# Patient Record
Sex: Female | Born: 1970
Health system: Southern US, Community
[De-identification: ages and names within clinical notes are randomized; demographics above are authoritative.]

## PROBLEM LIST (undated history)

## (undated) DIAGNOSIS — K635 Polyp of colon: Secondary | ICD-10-CM

## (undated) DIAGNOSIS — U071 COVID-19: Secondary | ICD-10-CM

## (undated) DIAGNOSIS — Z87442 Personal history of urinary calculi: Secondary | ICD-10-CM

## (undated) DIAGNOSIS — G43909 Migraine, unspecified, not intractable, without status migrainosus: Secondary | ICD-10-CM

## (undated) DIAGNOSIS — Z8619 Personal history of other infectious and parasitic diseases: Secondary | ICD-10-CM

## (undated) DIAGNOSIS — F419 Anxiety disorder, unspecified: Secondary | ICD-10-CM

## (undated) HISTORY — DX: Personal history of other infectious and parasitic diseases: Z86.19

## (undated) HISTORY — DX: Polyp of colon: K63.5

## (undated) HISTORY — DX: Anxiety disorder, unspecified: F41.9

## (undated) HISTORY — DX: Migraine, unspecified, not intractable, without status migrainosus: G43.909

---

## 1998-10-11 ENCOUNTER — Encounter: Admission: RE | Admit: 1998-10-11 | Discharge: 1999-01-09 | Payer: Self-pay | Admitting: Gynecology

## 1998-12-10 ENCOUNTER — Inpatient Hospital Stay (HOSPITAL_COMMUNITY): Admission: AD | Admit: 1998-12-10 | Discharge: 1998-12-10 | Payer: Self-pay | Admitting: Obstetrics and Gynecology

## 1999-01-21 ENCOUNTER — Inpatient Hospital Stay (HOSPITAL_COMMUNITY): Admission: AD | Admit: 1999-01-21 | Discharge: 1999-01-24 | Payer: Self-pay | Admitting: Obstetrics and Gynecology

## 1999-02-28 ENCOUNTER — Other Ambulatory Visit: Admission: RE | Admit: 1999-02-28 | Discharge: 1999-02-28 | Payer: Self-pay | Admitting: Obstetrics and Gynecology

## 2000-04-15 ENCOUNTER — Other Ambulatory Visit: Admission: RE | Admit: 2000-04-15 | Discharge: 2000-04-15 | Payer: Self-pay | Admitting: Gynecology

## 2001-07-28 ENCOUNTER — Other Ambulatory Visit: Admission: RE | Admit: 2001-07-28 | Discharge: 2001-07-28 | Payer: Self-pay | Admitting: Gynecology

## 2002-09-01 ENCOUNTER — Other Ambulatory Visit: Admission: RE | Admit: 2002-09-01 | Discharge: 2002-09-01 | Payer: Self-pay | Admitting: Gynecology

## 2003-09-06 ENCOUNTER — Other Ambulatory Visit: Admission: RE | Admit: 2003-09-06 | Discharge: 2003-09-06 | Payer: Self-pay | Admitting: Gynecology

## 2003-11-16 ENCOUNTER — Ambulatory Visit (HOSPITAL_COMMUNITY): Admission: RE | Admit: 2003-11-16 | Discharge: 2003-11-16 | Payer: Self-pay | Admitting: Gastroenterology

## 2004-02-28 ENCOUNTER — Encounter: Admission: RE | Admit: 2004-02-28 | Discharge: 2004-02-28 | Payer: Self-pay | Admitting: Gynecology

## 2004-11-22 ENCOUNTER — Other Ambulatory Visit: Admission: RE | Admit: 2004-11-22 | Discharge: 2004-11-22 | Payer: Self-pay | Admitting: Gynecology

## 2005-11-25 ENCOUNTER — Other Ambulatory Visit: Admission: RE | Admit: 2005-11-25 | Discharge: 2005-11-25 | Payer: Self-pay | Admitting: Gynecology

## 2006-05-15 ENCOUNTER — Other Ambulatory Visit: Admission: RE | Admit: 2006-05-15 | Discharge: 2006-05-15 | Payer: Self-pay | Admitting: Gynecology

## 2007-03-11 DIAGNOSIS — K635 Polyp of colon: Secondary | ICD-10-CM

## 2007-03-11 HISTORY — DX: Polyp of colon: K63.5

## 2007-03-26 ENCOUNTER — Other Ambulatory Visit: Admission: RE | Admit: 2007-03-26 | Discharge: 2007-03-26 | Payer: Self-pay | Admitting: Gynecology

## 2008-02-07 LAB — CONVERTED CEMR LAB: Pap Smear: NORMAL

## 2008-04-04 ENCOUNTER — Other Ambulatory Visit: Admission: RE | Admit: 2008-04-04 | Discharge: 2008-04-04 | Payer: Self-pay | Admitting: Gynecology

## 2008-06-26 ENCOUNTER — Emergency Department (HOSPITAL_BASED_OUTPATIENT_CLINIC_OR_DEPARTMENT_OTHER): Admission: EM | Admit: 2008-06-26 | Discharge: 2008-06-26 | Payer: Self-pay | Admitting: Emergency Medicine

## 2008-08-16 ENCOUNTER — Ambulatory Visit: Payer: Self-pay | Admitting: Gynecology

## 2009-01-10 ENCOUNTER — Ambulatory Visit: Payer: Self-pay | Admitting: Internal Medicine

## 2009-01-10 DIAGNOSIS — Z860100 Personal history of colon polyps, unspecified: Secondary | ICD-10-CM | POA: Insufficient documentation

## 2009-01-10 DIAGNOSIS — G4700 Insomnia, unspecified: Secondary | ICD-10-CM | POA: Insufficient documentation

## 2009-01-10 DIAGNOSIS — D126 Benign neoplasm of colon, unspecified: Secondary | ICD-10-CM | POA: Insufficient documentation

## 2009-01-10 DIAGNOSIS — M25559 Pain in unspecified hip: Secondary | ICD-10-CM | POA: Insufficient documentation

## 2009-01-10 DIAGNOSIS — K649 Unspecified hemorrhoids: Secondary | ICD-10-CM | POA: Insufficient documentation

## 2009-01-16 ENCOUNTER — Encounter: Payer: Self-pay | Admitting: Internal Medicine

## 2009-01-16 LAB — CONVERTED CEMR LAB
ALT: 17 units/L (ref 0–35)
AST: 16 units/L (ref 0–37)
Albumin: 4 g/dL (ref 3.5–5.2)
Alkaline Phosphatase: 74 units/L (ref 39–117)
BUN: 17 mg/dL (ref 6–23)
Basophils Absolute: 0.1 10*3/uL (ref 0.0–0.1)
Basophils Relative: 1 % (ref 0.0–3.0)
Bilirubin, Direct: 0.1 mg/dL (ref 0.0–0.3)
CO2: 26 meq/L (ref 19–32)
Calcium: 9.4 mg/dL (ref 8.4–10.5)
Chloride: 109 meq/L (ref 96–112)
Cholesterol: 159 mg/dL (ref 0–200)
Creatinine, Ser: 0.7 mg/dL (ref 0.4–1.2)
Eosinophils Absolute: 0.1 10*3/uL (ref 0.0–0.7)
Eosinophils Relative: 1.5 % (ref 0.0–5.0)
Free T4: 0.6 ng/dL (ref 0.6–1.6)
GFR calc Af Amer: 120 mL/min
GFR calc non Af Amer: 99 mL/min
Glucose, Bld: 87 mg/dL (ref 70–99)
HCT: 39.9 % (ref 36.0–46.0)
HDL: 39.5 mg/dL (ref >39.0)
Hemoglobin: 13.5 g/dL (ref 12.0–15.0)
LDL Cholesterol: 107 mg/dL — ABNORMAL HIGH (ref 0–99)
Lymphocytes Relative: 25.7 % (ref 12.0–46.0)
MCHC: 33.8 g/dL (ref 30.0–36.0)
MCV: 89.1 fL (ref 78.0–100.0)
Monocytes Absolute: 0.6 10*3/uL (ref 0.1–1.0)
Monocytes Relative: 8 % (ref 3.0–12.0)
Neutro Abs: 5.1 10*3/uL (ref 1.4–7.7)
Neutrophils Relative %: 63.8 % (ref 43.0–77.0)
Platelets: 293 10*3/uL (ref 150–400)
Potassium: 4 meq/L (ref 3.5–5.1)
RBC: 4.48 M/uL (ref 3.87–5.11)
RDW: 12.5 % (ref 11.5–14.6)
Sodium: 144 meq/L (ref 135–145)
TSH: 0.43 microintl units/mL (ref 0.35–5.50)
Total Bilirubin: 0.5 mg/dL (ref 0.3–1.2)
Total CHOL/HDL Ratio: 4
Total Protein: 6.8 g/dL (ref 6.0–8.3)
Triglycerides: 65 mg/dL (ref 0–149)
VLDL: 13 mg/dL (ref 0–40)
WBC: 7.9 10*3/uL (ref 4.5–10.5)

## 2009-01-17 ENCOUNTER — Telehealth: Payer: Self-pay | Admitting: Internal Medicine

## 2009-01-19 ENCOUNTER — Ambulatory Visit: Payer: Self-pay | Admitting: Internal Medicine

## 2009-01-19 DIAGNOSIS — G43819 Other migraine, intractable, without status migrainosus: Secondary | ICD-10-CM

## 2009-01-19 DIAGNOSIS — G43909 Migraine, unspecified, not intractable, without status migrainosus: Secondary | ICD-10-CM | POA: Insufficient documentation

## 2009-01-19 DIAGNOSIS — G43119 Migraine with aura, intractable, without status migrainosus: Secondary | ICD-10-CM | POA: Insufficient documentation

## 2009-01-24 ENCOUNTER — Encounter: Payer: Self-pay | Admitting: Internal Medicine

## 2009-01-24 ENCOUNTER — Telehealth: Payer: Self-pay | Admitting: Internal Medicine

## 2009-02-09 ENCOUNTER — Ambulatory Visit: Payer: Self-pay | Admitting: Internal Medicine

## 2009-02-28 ENCOUNTER — Encounter: Admission: RE | Admit: 2009-02-28 | Discharge: 2009-02-28 | Payer: Self-pay | Admitting: Gynecology

## 2009-03-15 ENCOUNTER — Encounter: Payer: Self-pay | Admitting: Internal Medicine

## 2009-04-03 ENCOUNTER — Telehealth: Payer: Self-pay | Admitting: *Deleted

## 2009-04-06 ENCOUNTER — Encounter: Payer: Self-pay | Admitting: Gynecology

## 2009-04-06 ENCOUNTER — Ambulatory Visit: Payer: Self-pay | Admitting: Gynecology

## 2009-04-06 ENCOUNTER — Other Ambulatory Visit: Admission: RE | Admit: 2009-04-06 | Discharge: 2009-04-06 | Payer: Self-pay | Admitting: Gynecology

## 2009-06-08 ENCOUNTER — Telehealth (INDEPENDENT_AMBULATORY_CARE_PROVIDER_SITE_OTHER): Payer: Self-pay | Admitting: *Deleted

## 2009-10-23 ENCOUNTER — Encounter (INDEPENDENT_AMBULATORY_CARE_PROVIDER_SITE_OTHER): Payer: Self-pay | Admitting: *Deleted

## 2010-02-09 ENCOUNTER — Emergency Department (HOSPITAL_BASED_OUTPATIENT_CLINIC_OR_DEPARTMENT_OTHER): Admission: EM | Admit: 2010-02-09 | Discharge: 2010-02-09 | Payer: Self-pay | Admitting: Emergency Medicine

## 2010-02-09 ENCOUNTER — Ambulatory Visit: Payer: Self-pay | Admitting: Diagnostic Radiology

## 2010-03-01 ENCOUNTER — Encounter: Admission: RE | Admit: 2010-03-01 | Discharge: 2010-03-01 | Payer: Self-pay | Admitting: Gynecology

## 2010-04-09 ENCOUNTER — Ambulatory Visit: Payer: Self-pay | Admitting: Gynecology

## 2010-04-09 ENCOUNTER — Other Ambulatory Visit: Admission: RE | Admit: 2010-04-09 | Discharge: 2010-04-09 | Payer: Self-pay | Admitting: Gynecology

## 2011-01-08 NOTE — Letter (Signed)
Summary: [facility]  [facility] Provider: This provider was preselected by the workflow.  Signature: The signature status of this document was preset by the workflow  Processed by [employer] Local Indexer Client @ Friday, October 27, 2009 2:05:19 PM using version:2010.1.2.11(2.4)   Manually Indexed By: [id number]  idlBatchDetail: [id number]   _____________________________________________________________________  External Attachment:    Type:   Image     Comment:   External Document

## 2011-01-08 NOTE — Progress Notes (Signed)
Summary: Phone note  Phone Note Call from Patient   Summary of Call: Patient returning your call. Please call her back at [phone]. Initial call taken by: Darra Lis RMA,  April 03, 2009 9:16 AM  Follow-up for Phone Call        I spoke to pt and told her that we didn't call her. She said that she did have a stress test on Wed. I told her that we would call her once we get the results. Follow-up by: Romualdo Bolk, CMA,  April 03, 2009 3:16 PM

## 2011-01-08 NOTE — Assessment & Plan Note (Signed)
Summary: 1 mo rov/mm   Vital Signs:  Patient Profile:   40 Years Old Female Height:     65.5 inches Weight:      195.2 pounds Pulse rate:   78 / minute BP sitting:   110 / 70  (left arm) Cuff size:   regular  Vitals Entered By: Romualdo Bolk, CMA (February 09, 2009 9:19 AM)                 Chief Complaint:  Follow up.  History of Present Illness: Patricia Erickson is here for a follow up. inregard to HA and sinus congestion    Pt has seen Dr. Neale Burly and he told her to go off everything and cancel ENT appt. He doesn't want her on any narcotics.  On topamax and flexeril  for the last 2 weeks.   Feels the whole clinincal picture is from  Migraine HAs. INregard to insomia .  Pt is going to see a Sleep specialist at his office.   His has referred her to a nutrionist.   In the meantime has had extra stress and  Took xanax  x 1 after   Father  LAw died.      1 pill at  night .   HAs slightly improved since last visit.     Prior Medications Reviewed Using: Patient Recall  Updated Prior Medication List: MIRENA 20 MCG/24HR IUD (LEVONORGESTREL)  TOPAMAX 25 MG TABS (TOPIRAMATE) 3 by mouth at bedtime FLEXERIL 10 MG TABS (CYCLOBENZAPRINE HCL) 1/2 to 1 three times a day ALPRAZOLAM 0.5 MG TABS (ALPRAZOLAM) 1 by mouth one to two times a day as needed.  Current Allergies (reviewed today): ! DARVOCET  Past Medical History:    Past Medical History:         G2P1` toxemia            LAST       Mammogram: 01/2006       Pap: 3/09       Td: over 10 years       Colonscopy: 2009       EKG: 2009       Dexa: 2008       Eye Exam: 2009              Smoking: former        Consults       Dr. Barton Fanny       Dr. Loreta Ave- GI       Colonic polyps, hx of   aggressive on q 3 years surveillance .     Dr. Marylene Buerger    Dr. Charlann Boxer    Dr. Pollyann Kennedy    Dr. Neale Burly   Social History:    Father in law recently died      Social History:       Former Smoker       Alcohol use-no       Drug use-no  Regular exercise-yes       hh of 3  1 dog           no ets.        2-4 hours of sleep     Review of Systems  The patient denies anorexia, fever, unusual weight change, enlarged lymph nodes, and angioedema.     Physical Exam  General:     Well-developed,well-nourished,in no acute distress; alert,appropriate and cooperative throughout examination mildly uncomfortable with ha  Additional Exam:     reviewed Dr Neale Burly  notes     Impression & Recommendations:  Problem # 1:  MIGRAINE HEADACHE (ICD-346.90) agree with dx and  encourage to continue with plan . The following medications were removed from the medication list:    Norco 5-325 Mg Tabs (Hydrocodone-acetaminophen) .Marland Kitchen... 1 by mouth q 4-6 hours as needed sever pain   Problem # 2:  INSOMNIA (ICD-780.52) to be evaluated this week.  Problem # 3:  HIP PAIN, LEFT (ICD-719.45) intermettent  and will reschedule to tsee ortho . fam hx of hip problems . Ask Dr Elayne Guerin about possiblility of topical voltaren. if needed and also get ortho to rec any exercise intervention. The following medications were removed from the medication list:    Norco 5-325 Mg Tabs (Hydrocodone-acetaminophen) .Marland Kitchen... 1 by mouth q 4-6 hours as needed sever pain  Her updated medication list for this problem includes:    Flexeril 10 Mg Tabs (Cyclobenzaprine hcl) .Marland Kitchen... 1/2 to 1 three times a day   Complete Medication List: 1)  Mirena 20 Mcg/24hr Iud (Levonorgestrel) 2)  Topamax 25 Mg Tabs (Topiramate) .... 3 by mouth at bedtime 3)  Flexeril 10 Mg Tabs (Cyclobenzaprine hcl) .... 1/2 to 1 three times a day 4)  Alprazolam 0.5 Mg Tabs (Alprazolam) .Marland Kitchen.. 1 by mouth one to two times a day as needed.   Patient Instructions: 1)  will recheck as needed . TO follow up with specialist.

## 2011-01-08 NOTE — Assessment & Plan Note (Signed)
Summary: HEADACHE/[facility]   Vital Signs:  Patient Profile:   40 Years Old Female Weight:      195 pounds Temp:     97.8 degrees F oral Pulse rate:   74 / minute BP sitting:   100 / 60  (left arm) Cuff size:   regular  Vitals Entered By: Romualdo Bolk, CMA (January 19, 2009 8:17 AM)  Menstrual History: LMP - Character: 03/2007-IUD Menarche: [age]                 Chief Complaint:  Headache.  History of Present Illness: Patricia Erickson is here for a headache that started on 01/10/09. Pt states that the severe one started after d/c decongestant but she ususally has some form of ha everyday and gets worse as the day goes on.   See phone notes   Now pain is about a 5/10.    worse as day goes on.  starts mid face andthen  No vision changes  and gets nausea. Better today  . is stopping   decongestant   .   Ha mostly every daysince then  and  then increase ha now.    .   Has  taken 2 days of prednisone.   no se . trazadone no help  with sleep so far and no side effect with daytime sleepiness .     Prior Medications Reviewed Using: Patient Recall  Prior Medication List:  MIRENA 20 MCG/24HR IUD (LEVONORGESTREL)  OMNARIS 50 MCG/ACT SUSP (CICLESONIDE) 2 spray eaach nares each day TRAZODONE HCL 50 MG TABS (TRAZODONE HCL) 1 by mouth hs can increase to 75 mg hs or as directed PREDNISONE 20 MG TABS (PREDNISONE) 2 by mouth tonight and 2 in am then as directed   Current Allergies (reviewed today): ! DARVOCET  Past Medical History:    Past Medical History:         G2P1` toxemia            LAST       Mammogram: 01/2006       Pap: 3/09       Td: over 10 years       Colonscopy: 2009       EKG: 2009       Dexa: 2008       Eye Exam: 2009              Smoking: former       Consults       Dr. Barton Fanny       Dr. Loreta Ave- GI       Colonic polyps, hx of   aggresseive on q 3 years surveillance .     Dr. Marylene Buerger    Dr. Charlann Boxer    Dr. Pollyann Kennedy  Past Surgical History:        Past  Surgical History:       C-Section       G2 P1   Family History:    Family History:       Father: 2 heart Stents 66 yrs, atrial fib, arthritis, bulging back disk and double knee replacement       Mother: HBP, restless leg syndrome, total abd. colectomy (benign mass), back fusion x 2, arthritis, lymphoma in abdomen 2 009        Siblings: Sister-back pain, headaches       Maternal Aunt   46 died  colon cancer        PGF Diabetes   Social History:  Social History:       Former Smoker       Alcohol use-no       Drug use-no       Regular exercise-yes       hh of 3  1 dog          no ets.        2-4 hours of sleep     Review of Systems  The patient denies anorexia, fever, weight loss, vision loss, chest pain, syncope, transient blindness, difficulty walking, depression, enlarged lymph nodes, and angioedema.     Physical Exam  General:     well-developed, well-nourished, and well-hydrated.  milldy uncomfortable Head:     Normocephalic and atraumatic without obvious abnormalities. No apparent alopecia or balding. Eyes:     PERRL, EOMs full, conjunctiva clear  Ears:     R ear normal and L ear normal.   Nose:     no external deformity, no external erythema, and no nasal discharge.   Mouth:     pharynx pink and moist.   Neck:     No deformities, masses, or tenderness noted. Lungs:     normal respiratory effort and no intercostal retractions.   Heart:     Normal rate and regular rhythm. S1 and S2 normal without gallop, murmur, click, rub or other extra sounds. Extremities:     nlo acute changes  Neurologic:     non focal groslly alert & oriented X3, strength normal in all extremities, and gait normal.   Skin:     turgor normal and color normal.   Cervical Nodes:     No lymphadenopathy noted Psych:     Oriented X3, normally interactive, not anxious appearing, and not depressed appearing.      Impression & Recommendations:  Problem # 1:  MIGRAINE HEADACHE  (ICD-346.90) probable  chronic daily HAs  with progression r/o sinusitis problems etc.    non focal exam today  . would benefit from HA referral also .  ENT evaluation pending  Her updated medication list for this problem includes:    Norco 5-325 Mg Tabs (Hydrocodone-acetaminophen) .Marland Kitchen... 1 by mouth q 4-6 hours as needed sever pain  Orders: Ketorolac-Toradol 15mg  (K0254) Admin of Therapeutic Inj  intramuscular or subcutaneous (27062) Headache Clinic Referral (Headache)   Problem # 2:  Preventive Health Care (ICD-V70.0) reviewed lipid level    mediterranean diet and exercise . avoid trans fats.   Problem # 3:  INSOMNIA (ICD-780.52) Assessment: Comment Only increase meds . ? if decongestants contributing.  Complete Medication List: 1)  Mirena 20 Mcg/24hr Iud (Levonorgestrel) 2)  Omnaris 50 Mcg/act Susp (Ciclesonide) .... 2 spray eaach nares each day 3)  Trazodone Hcl 50 Mg Tabs (Trazodone hcl) .Marland Kitchen.. 1 by mouth hs can increase to 75 mg hs or as directed 4)  Prednisone 20 Mg Tabs (Prednisone) .... 2 by mouth tonight and 2 in am then as directed 5)  Norco 5-325 Mg Tabs (Hydrocodone-acetaminophen) .Marland Kitchen.. 1 by mouth q 4-6 hours as needed sever pain   Patient Instructions: 1)  increase  trazadone to 100 mg at night . 2)  Stay on 40 mg   of prednisone   2 20 mg    for another 5 days .  3)  Will do a HA referral also  4)  narcotic for rescue   medication   .     Prescriptions: NORCO 5-325 MG TABS (HYDROCODONE-ACETAMINOPHEN) 1 by mouth q 4-6 hours as  needed sever pain  #15 x 0   Entered and Authorized by:   Madelin Headings MD   Signed by:   Madelin Headings MD on 01/19/2009   Method used:   Print then Give to Patient   RxID:   [id number]    Medication Administration  Injection # 1:    Medication: Ketorolac-Toradol 15mg     Diagnosis: MIGRAINE HEADACHE (ICD-346.90)    Route: IM    Site: RUOQ gluteus    Exp Date: 06/08/2010    Lot #: [id number]    Mfr: novaplus    Comments: Gave  60mg     Patient tolerated injection without complications    Given by: Romualdo Bolk, CMA (January 19, 2009 9:00 AM)  Orders Added: 1)  Ketorolac-Toradol 15mg  [J1885] 2)  Admin of Therapeutic Inj  intramuscular or subcutaneous [96372] 3)  Est. Patient Level IV [62130] 4)  Headache Clinic Referral [Headache]

## 2011-01-08 NOTE — Progress Notes (Signed)
Summary: headache so bad  Phone Note Call from Patient Call back at [phone]   Caller: live Call For: panosh Reason for Call: Acute Illness Summary of Call: Off the decongestant & using the nasal spray and Motrin 800mg  daily that she takes for her hip pain no help with headache.  Head hurts so bad was in bed today until 1pm & still would be if she didn't have to drive son to soccer.  Cannot make it like this until ENT appt 2-23.  What to do?  [facility].   Allergic to Darvocet. Initial call taken by: Rudy Jew, RN,  January 17, 2009 3:20 PM  Follow-up for Phone Call        Per Dr. Danelle Berry no fever can call in prednisone 20mg  #20 2 at bedtime and 2am or as directed and ov tomorrow. If miagraine this may help but if has a fever may need to go to ED. Follow-up by: Romualdo Bolk, CMA,  January 17, 2009 3:29 PM  Additional Follow-up for Phone Call Additional follow up Details #1::        I spoke to pt, she can't get here today. She is not running a fever. Pt will come in tomorrow and wants rx called into [facility]. Rx called in. Additional Follow-up by: Romualdo Bolk, CMA,  January 17, 2009 3:45 PM    Additional Follow-up for Phone Call Additional follow up Details #2::    Pt called back saying that she can't come in tomorrow. Pt  did make an appt for Thurs and will go ahead and take 2 tabs daily until her appt. Follow-up by: Romualdo Bolk, CMA,  January 17, 2009 4:12 PM  New/Updated Medications: PREDNISONE 20 MG TABS (PREDNISONE) 2 by mouth tonight and 2 in am then as directed   Prescriptions: PREDNISONE 20 MG TABS (PREDNISONE) 2 by mouth tonight and 2 in am then as directed  #20 x 0   Entered by:   Romualdo Bolk, CMA   Authorized by:   Madelin Headings MD   Signed by:   Romualdo Bolk, CMA on 01/17/2009   Method used:   Electronically to        [facility] (retail)       [phone]       [place], [place]  [id number]       Ph: [phone]       Fax: [phone]   RxID:   [id number]

## 2011-01-08 NOTE — Consult Note (Signed)
[facility]: [facility]   Imported By: Maryln Gottron 02/09/2009 12:37:44  _____________________________________________________________________  External Attachment:    Type:   Image     Comment:   External Document

## 2011-01-08 NOTE — Progress Notes (Signed)
Summary: Medical History provided by patient  Medical History provided by patient   Imported By: Maryln Gottron 01/25/2009 13:05:33  _____________________________________________________________________  External Attachment:    Type:   Image     Comment:   External Document

## 2011-01-08 NOTE — Assessment & Plan Note (Signed)
Summary: new pt to establish/jls   Vital Signs:  Patient Profile:   40 Years Old Female Height:     65.5 inches Weight:      177 pounds Pulse rate:   78 / minute BP sitting:   120 / 80  (right arm) Cuff size:   regular  Vitals Entered By: Romualdo Bolk, CMA (January 10, 2009 9:07 AM)  Menstrual History: LMP - Character: spotting- 2 months ago Menarche: 13                 Chief Complaint:  New Patient.  History of Present Illness: Patricia Erickson is here to get establish.  Hasnt had a PCP  but GYne adn Pt is having left sided hip pain that has been going on and off for 2 years. Pt did have PT done on it and it didn't help. Pt is also having trouble sleeping. Pt has recurrent sinus infections and is also having burning feet. She only notices it when she wears Tennis Shoes. Pt is also stuggling with her weight even with diet and exercise.   Last labs one year ago.    Dr Steward Ros.       Sleep  :     took husbands neurontin  and helped .     Has been given [place] cr   [facility] .   Problem for about 18  months ago.     No felt to be related to stress  is a light sleeper .   sound and light.   Gets 4 full hours .      No osa signssnoring with congestion husband snores.  NO RLS  . Hip pain    pulled tendon and PT not helping and worse  with exercise  and sitting is painful.  after 6 weeks or so .  lateral left  area.    No treatment except motrin  OTC dosing.    Ball of foot burns when does confined shoes .    Chronic sinus pressure signs   .   takes mucinex d  most days   in am for about 3-4 years .   then pain gets worse.    remote hx of migraines .   sick ha s  .   No hx of     Doesnt like nasal sprays .   spring allergy  signs .   allegra in the past.      HA face temperature chanbes are worse .   No rhinorrhea.       Prior Medications Reviewed Using: Patient Recall  Updated Prior Medication List: MIRENA 20 MCG/24HR IUD (LEVONORGESTREL)   Current  Allergies: ! DARVOCET  Past Medical History:      G2P1` toxemia         LAST    Mammogram: 01/2006    Pap: 3/09    Td: over 10 years    Colonscopy: 2009    EKG: 2009    Dexa: 2008    Eye Exam: 2009        Smoking: former    Consults    Dr. Barton Fanny    Dr. Loreta Ave- GI    Colonic polyps, hx of   aggresseive on q 3 years surveillance .   Past Surgical History:    C-Section    G2 P1   Family History:    Father: 2 heart Stents 66 yrs, atrial fib, arthritis, bulging back disk and  double knee replacement    Mother: HBP, restless leg syndrome, total abd. colectomy (benign mass), back fusion x 2, arthritis, lymphoma in abdomen 2 009     Siblings: Sister-back pain, headaches    Maternal Aunt   46 died  colon cancer     PGF Diabetes   Social History:    Former Smoker    Alcohol use-no    Drug use-no    Regular exercise-yes    hh of 3  1 dog       no ets.     2-4 hours of sleep    Risk Factors:  Tobacco use:  quit    Year quit:  age 55 Drug use:  no Alcohol use:  no Exercise:  yes    Times per week:  3    Type:  treadmill and eliptical  PAP Smear History:     Date of Last PAP Smear:  02/07/2008    Results:  Normal   Colonoscopy History:     Date of Last Colonoscopy:  12/10/2007    Results:  Normal   Mammogram History:     Date of Last Mammogram:  01/09/2006    Results:  Normal Bilateral    Review of Systems       The patient complains of headaches.  The patient denies anorexia, fever, weight loss, chest pain, syncope, dyspnea on exertion, prolonged cough, melena, hematochezia, severe indigestion/heartburn, transient blindness, enlarged lymph nodes, and angioedema.         never has slept well   Physical Exam  General:     Well-developed,well-nourished,in no acute distress; alert,appropriate and cooperative throughout examination Head:     Normocephalic and atraumatic without obvious abnormalities. No apparent alopecia or balding. Eyes:     PERRL,  EOMs full, conjunctiva clear  Ears:     R ear normal, L ear normal, and no external deformities.   Nose:     no external deformity and no external erythema.  face ? tender  ethmoid frontal area  Mouth:     pharynx pink and moist.  low lying palate Neck:     No deformities, masses, or tenderness noted. Lungs:     Normal respiratory effort, chest expands symmetrically. Lungs are clear to auscultation, no crackles or wheezes. Heart:     Normal rate and regular rhythm. S1 and S2 normal without gallop, murmur, click, rub or other extra sounds.no lifts.   Abdomen:     Bowel sounds positive,abdomen soft and non-tender without masses, organomegaly or hernias noted. Msk:     no atrophy mild tender left lateral hip     gait normal today Pulses:     prsent intact  Extremities:     no cce  Neurologic:     non focal  Skin:     turgor normal, color normal, no suspicious lesions, and no ecchymoses.   Cervical Nodes:     No lymphadenopathy noted Psych:     Oriented X3, normally interactive, good eye contact, not anxious appearing, and not depressed appearing.      Impression & Recommendations:  Problem # 1:  INSOMNIA (ICD-780.52)  Orders: Sedimentation Rate, non-automated (09811) Venipuncture (91478) TLB-TSH (Thyroid Stimulating Hormone) (84443-TSH) TLB-Hepatic/Liver Function Pnl (80076-HEPATIC) TLB-CBC Platelet - w/Differential (85025-CBCD) TLB-BMP (Basic Metabolic Panel-BMET) (80048-METABOL) TLB-Lipid Panel (80061-LIPID) TLB-T4 (Thyrox), Free [phone])   Problem # 2:  HIP PAIN, LEFT (ICD-719.45)  Orders: Sedimentation Rate, non-automated (65784) Venipuncture (69629) TLB-Hepatic/Liver Function Pnl (80076-HEPATIC) TLB-CBC Platelet - w/Differential (85025-CBCD)  TLB-BMP (Basic Metabolic Panel-BMET) (80048-METABOL) TLB-Lipid Panel (80061-LIPID) Orthopedic Referral (Ortho)   Problem # 3:  ? of SINUSITIS, CHRONIC (ICD-473.9)  Her updated medication list for this problem  includes:    Omnaris 50 Mcg/act Susp (Ciclesonide) .Marland Kitchen... 2 spray eaach nares each day  Orders: Sedimentation Rate, non-automated (16109) Venipuncture (60454) TLB-Hepatic/Liver Function Pnl (80076-HEPATIC) TLB-CBC Platelet - w/Differential (85025-CBCD) TLB-BMP (Basic Metabolic Panel-BMET) (80048-METABOL) TLB-Lipid Panel (80061-LIPID) ENT Referral (ENT)   Problem # 4:  COLONIC POLYPS, HX OF (ICD-V12.72)  Orders: TLB-Hepatic/Liver Function Pnl (80076-HEPATIC) TLB-CBC Platelet - w/Differential (85025-CBCD) TLB-BMP (Basic Metabolic Panel-BMET) (80048-METABOL) TLB-Lipid Panel (80061-LIPID)   Problem # 5:  foot pain sound like a local phenomenon.  Complete Medication List: 1)  Mirena 20 Mcg/24hr Iud (Levonorgestrel) 2)  Omnaris 50 Mcg/act Susp (Ciclesonide) .... 2 spray eaach nares each day 3)  Trazodone Hcl 50 Mg Tabs (Trazodone hcl) .Marland Kitchen.. 1 by mouth hs can increase to 75 mg hs or as directed  PAP Screening:    Last PAP smear:  02/07/2008  Mammogram Screening:    Last Mammogram:  01/09/2006  Osteoporosis Risk Assessment:  Risk Factors for Fracture or Low Bone Density:   Race (White or Asian):     yes   Smoking status:       quit  Bone Density (DEXA Scan) Results:    Date of Exam:  12/09/2006    Results:  Normal   Patient Instructions: 1)  will do ortho sports medicine   and ENT  consult   2)  Minimize your decongestant andt try nasal steroid.for a t least 2 weeks. 3)  Ibuprofen 800 mg up to every 8 hours for your hip pain.    4)  return office visit in about 1-2 months or asneeded.    Prescriptions: TRAZODONE HCL 50 MG TABS (TRAZODONE HCL) 1 by mouth hs can increase to 75 mg hs or as directed  #45 x 1   Entered and Authorized by:   Madelin Headings MD   Signed by:   Madelin Headings MD on 01/10/2009   Method used:   Print then Give to Patient   RxID:   [id number] OMNARIS 50 MCG/ACT SUSP (CICLESONIDE) 2 spray eaach nares each day  #1 x 3   Entered and Authorized  by:   Madelin Headings MD   Signed by:   Madelin Headings MD on 01/10/2009   Method used:   Print then Give to Patient   RxID:   [id number]   Laboratory Results   Blood Tests     SED rate: 5 mm/hr  Comments: Rita Ohara  January 10, 2009 12:04 PM

## 2011-01-08 NOTE — Progress Notes (Signed)
Summary: cancel ENT  Phone Note Call from Patient   Summary of Call: Pt called to cancel ENT appt.  She was diagnosed with migraines today, and she does not need ENT.  She will cancel herself. Initial call taken by: Lynann Beaver CMA,  January 24, 2009 1:20 PM

## 2011-01-08 NOTE — Progress Notes (Signed)
Summary: Needs med for motion sickness  Phone Note Call from Patient   Summary of Call: Patient is going on a crusie next Thursday and wants something to take with her for motion sickness. Pharm/[facility]. Patient can be reached at [phone]. Initial call taken by: Darra Lis RMA,  June 08, 2009 3:06 PM  Follow-up for Phone Call        she can either use dramamine or bonine  over the counter  and or transderm scopol omine  ear patch apply 1 hours before cruise and change or remove in 72  hours . Disp # 2  Follow-up by: Madelin Headings MD,  June 09, 2009 9:08 AM  Additional Follow-up for Phone Call Additional follow up Details #1::        Patient informed and Rx called to [facility] [phone]. Additional Follow-up by: Darra Lis RMA,  June 09, 2009 9:34 AM

## 2011-01-08 NOTE — Letter (Signed)
Summary: Lipid Letter  [facility]  [address] [place], [place] [address]   Phone: [phone]  Fax: [phone]    01/16/2009  [facility] [address] [place], [place]  [address]  Dear Mindi Junker:  We have carefully reviewed your last lipid profile from 01/10/2009 and the results are noted below with a summary of recommendations for lipid management.    Cholesterol:       159     Goal: < 200   HDL "good" Cholesterol:   62.9     Goal: > 39.0   LDL "bad" Cholesterol:   107     Goal: < 99   Triglycerides:       65     Goal: < 150     All other labs are normal and we will discuss this with you when come in for your next office visit.    TLC Diet (Therapeutic Lifestyle Change): Saturated Fats & Transfatty acids should be kept < 7% of total calories ***Reduce Saturated Fats Polyunstaurated Fat can be up to 10% of total calories Monounsaturated Fat Fat can be up to 20% of total calories Total Fat should be no greater than 25-35% of total calories Carbohydrates should be 50-60% of total calories Protein should be approximately 15% of total calories Fiber should be at least 20-30 grams a day ***Increased fiber may help lower LDL Total Cholesterol should be < 200mg /day Consider adding plant stanol/sterols to diet (example: Benacol spread) ***A higher intake of unsaturated fat may reduce Triglycerides and Increase HDL    Adjunctive Measures (may lower LIPIDS and reduce risk of Heart Attack) include: Aerobic Exercise (20-30 minutes 3-4 times a week) Limit Alcohol Consumption Weight Reduction Aspirin 75-81 mg a day by mouth (if not allergic or contraindicated) Dietary Fiber 20-30 grams a day by mouth     Current Medications: 1)    Mirena 20 Mcg/24hr Iud (Levonorgestrel) 2)    Omnaris 50 Mcg/act Susp (Ciclesonide) .... 2 spray eaach nares each day 3)    Trazodone Hcl 50 Mg Tabs (Trazodone hcl) .Marland Kitchen.. 1 by mouth hs can increase to 75 mg hs or as directed  If you have any  questions, please call. We appreciate being able to work with you.   Sincerely,    [facility] MD

## 2011-03-04 LAB — BASIC METABOLIC PANEL
BUN: 17 mg/dL (ref 6–23)
CO2: 24 mEq/L (ref 19–32)
Calcium: 8.9 mg/dL (ref 8.4–10.5)
Chloride: 110 mEq/L (ref 96–112)
Creatinine, Ser: 0.8 mg/dL (ref 0.4–1.2)
GFR calc Af Amer: >60 mL/min (ref >60)
GFR calc non Af Amer: >60 mL/min (ref >60)
Glucose, Bld: 91 mg/dL (ref 70–99)
Potassium: 3.8 mEq/L (ref 3.5–5.1)
Sodium: 144 mEq/L (ref 135–145)

## 2011-03-04 LAB — DIFFERENTIAL
Basophils Absolute: 0 10*3/uL (ref 0.0–0.1)
Basophils Relative: 0 % (ref 0–1)
Eosinophils Absolute: 0.2 10*3/uL (ref 0.0–0.7)
Eosinophils Relative: 2 % (ref 0–5)
Lymphocytes Relative: 32 % (ref 12–46)
Lymphs Abs: 2.8 10*3/uL (ref 0.7–4.0)
Monocytes Absolute: 0.7 10*3/uL (ref 0.1–1.0)
Monocytes Relative: 8 % (ref 3–12)
Neutro Abs: 5.2 10*3/uL (ref 1.7–7.7)
Neutrophils Relative %: 58 % (ref 43–77)

## 2011-03-04 LAB — URINALYSIS, ROUTINE W REFLEX MICROSCOPIC
Bilirubin Urine: NEGATIVE
Glucose, UA: NEGATIVE mg/dL
Ketones, ur: NEGATIVE mg/dL
Leukocytes, UA: NEGATIVE
Nitrite: NEGATIVE
Protein, ur: NEGATIVE mg/dL
Specific Gravity, Urine: 1.007 (ref 1.005–1.030)
Urobilinogen, UA: 0.2 mg/dL (ref 0.0–1.0)
pH: 6.5 (ref 5.0–8.0)

## 2011-03-04 LAB — RPR: RPR Ser Ql: NONREACTIVE

## 2011-03-04 LAB — CBC
HCT: 39.7 % (ref 36.0–46.0)
Hemoglobin: 13.6 g/dL (ref 12.0–15.0)
MCHC: 34.2 g/dL (ref 30.0–36.0)
MCV: 89.1 fL (ref 78.0–100.0)
Platelets: 310 10*3/uL (ref 150–400)
RBC: 4.45 MIL/uL (ref 3.87–5.11)
RDW: 12.6 % (ref 11.5–15.5)
WBC: 8.9 10*3/uL (ref 4.0–10.5)

## 2011-03-04 LAB — URINE MICROSCOPIC-ADD ON

## 2011-03-04 LAB — PREGNANCY, URINE: Preg Test, Ur: NEGATIVE

## 2011-04-26 NOTE — Op Note (Signed)
NAME:  Patricia Erickson, Patricia Erickson                              ACCOUNT NO.:  [id number]   MEDICAL RECORD NO.:  [id number]                   PATIENT TYPE:  AMB   LOCATION:  ENDO                                 FACILITY:  [facility]   PHYSICIAN:  Anselmo Rod, M.D.               DATE OF BIRTH:  1971-02-15   DATE OF PROCEDURE:  11/16/2003  DATE OF DISCHARGE:                                 OPERATIVE REPORT   PROCEDURE:  Screening colonoscopy.   ENDOSCOPIST:  Anselmo Rod, M.D.   INSTRUMENT USED:  Olympus video colonoscope.   INDICATIONS FOR PROCEDURE:  A 40 year old white female with a family history  of colon cancer in her mother undergoing a screening colonoscopy.  Her  sister has also had colonic polyps removed.  Rule out colonic polyps,  masses, etc.   PREPROCEDURE PREPARATION:  Informed consent was procured from the patient.  The patient was fasted for eight hours prior to the procedure and prepped  with a bottle of magnesium citrate and a gallon of GoLYTELY the night prior  to the procedure.   PREPROCEDURE PHYSICAL EXAMINATION:  VITAL SIGNS:  Stable.  NECK:  Supple.  CHEST:  Clear to auscultation.  S1 and S2 regular.  ABDOMEN:  Soft with normal bowel sounds.   DESCRIPTION OF PROCEDURE:  The patient was placed in the left lateral  decubitus position and sedated with 100 mg of Demerol and 10 mg of Versed in  slow incremental doses.  Once the patient was adequately sedated and  maintained on low flow oxygen and continuous cardiac monitoring, the Olympus  video colonoscope was advanced from the rectum to the cecum and terminal  ileum.  The appendiceal orifice and ileocecal valve were clearly visualized  and photographed.  No masses, polyps, erosions, ulcerations, or diverticula  were seen.  Retroflexion in the rectum revealed no abnormalities.  The  patient tolerated the procedure well without complications.   IMPRESSION:  Normal colonoscopy up to the terminal ileum.   RECOMMENDATIONS:  1. Repeat CRC screening is recommended in the next five years unless the     patient develops any abnormal symptoms in the interim.  2. Outpatient follow-up as need arises in the future.                                               Anselmo Rod, M.D.    JNM/MEDQ  D:  11/16/2003  T:  11/16/2003  Job:  [id number]   cc:   Gaetano Hawthorne. Lily Peer, M.D.  [address]  [place]  [place] [address]  Fax: [phone]   Isabelle Course  [phone] [address]. [address]  [place]  [place] [address]  Fax: [phone]

## 2011-07-30 ENCOUNTER — Encounter: Payer: Self-pay | Admitting: Anesthesiology

## 2011-07-30 DIAGNOSIS — G43909 Migraine, unspecified, not intractable, without status migrainosus: Secondary | ICD-10-CM | POA: Insufficient documentation

## 2011-07-31 ENCOUNTER — Other Ambulatory Visit (HOSPITAL_COMMUNITY)
Admission: RE | Admit: 2011-07-31 | Discharge: 2011-07-31 | Disposition: A | Discharge status: Home / Self Care | Payer: BC Managed Care – PPO | Source: Ambulatory Visit | Attending: Gynecology | Admitting: Gynecology

## 2011-07-31 ENCOUNTER — Encounter: Payer: Self-pay | Admitting: Gynecology

## 2011-07-31 ENCOUNTER — Ambulatory Visit (INDEPENDENT_AMBULATORY_CARE_PROVIDER_SITE_OTHER): Payer: BC Managed Care – PPO | Admitting: Gynecology

## 2011-07-31 VITALS — BP 124/80 | Ht 65.25 in | Wt 190.0 lb

## 2011-07-31 DIAGNOSIS — R635 Abnormal weight gain: Secondary | ICD-10-CM

## 2011-07-31 DIAGNOSIS — Z833 Family history of diabetes mellitus: Secondary | ICD-10-CM

## 2011-07-31 DIAGNOSIS — Z01419 Encounter for gynecological examination (general) (routine) without abnormal findings: Secondary | ICD-10-CM | POA: Insufficient documentation

## 2011-07-31 DIAGNOSIS — Z13220 Encounter for screening for lipoid disorders: Secondary | ICD-10-CM

## 2011-07-31 NOTE — Patient Instructions (Signed)
Remember to schedule your mammogram 

## 2011-07-31 NOTE — Progress Notes (Signed)
Patricia Erickson Key Apr 15, 1971 [id number]   History:    40 y.o.  for annual exam that stated that this past weekend she had some mild suprapubic discomfort but eventually resolved she has a Mirena IUD that was placed in may of 2008 and scheduled to be removed the next year or changed. She denied any nausea vomiting, she denied any fever chills, no change in bowel movements or urinary function. Review of her records indicated that she gained 2 pounds last year and her BMI is 31.38. She has a history of benign colonic polyp in 2008 have a followup colonoscopy with Dr. Loreta Ave in 2011 with a negative screen and schedule for 5 years recall. Patient with strong family history of colon cancer were by her mother had a bowel resection and her aunt and colon cancer as well at the age of 35 and her sister has colonic polyps. Her mother also was recently diagnosed with breast cancer as well. Patient's mother and grandmother with history of osteoporosis as well. Patient denies any menses as a result of her Mirena IUD. She scheduled for mammogram later this month and she does her monthly self breast examinations.  Past medical history,surgical history, family history and social history were all reviewed and documented in the EPIC chart. ROS:  Was performed and pertinent positives and negatives are included in the history.  Exam: chaperone present Filed Vitals:   07/31/11 0857  BP: 124/80   @WEIGHT @ Body mass index is 31.38 kg/(m^2).  General appearance : Well developed well nourished female. Skin grossly normal HEENT: Neck supple, trachea midline Lungs: Clear to auscultation, no rhonchi or wheezes Heart: Regular rate and rhythm, no murmurs or gallops Breast:Examined in sitting and supine position were symmetrical in appearance, no palpable masses, to skin retraction, no nipple inversion, no nipple discharge and no axillary or supraclavicular lymphadenopathy Abdomen: no palpable masses or  tenderness Pelvic  Ext/BUS/vagina  normal   Cervix  normal IUD string seen  Uterus  anteverted, normal size, shape and contour, midline and mobile nontender   Adnexa  Without masses or tenderness  Anus and perineum  normal   Rectovaginal  normal sphincter tone without palpated masses or tenderness             Hemoccult not done     Assessment/Plan:  40 y.o. female for annual exam no abnormalities detected. We discussed nutrition and exercise to be incorporated into irregular schedule. She has had family members have been ill and for this reason she has not been eating well and taking care herself. We will check a TSH because of her increased weight as well as a random blood sugar and also because of her family history of diabetes. We'll do a screening cholesterol CBC urinalysis and Pap smear. She was reminded to schedule her mammogram and to continue her monthly self breast examination and continue her calcium and vitamin D for osteoporosis prevention and will see her back in one year or when necessary. Next year in May she needs to return to have her IUD removed or changed.    Ok Edwards MD, 9:34 AM 07/31/2011

## 2011-12-18 ENCOUNTER — Other Ambulatory Visit: Payer: Self-pay | Admitting: Gynecology

## 2011-12-18 DIAGNOSIS — Z1231 Encounter for screening mammogram for malignant neoplasm of breast: Secondary | ICD-10-CM

## 2012-01-01 ENCOUNTER — Ambulatory Visit
Admission: RE | Admit: 2012-01-01 | Discharge: 2012-01-01 | Disposition: A | Discharge status: Home / Self Care | Payer: BC Managed Care – PPO | Source: Ambulatory Visit | Attending: Gynecology | Admitting: Gynecology

## 2012-01-01 DIAGNOSIS — Z1231 Encounter for screening mammogram for malignant neoplasm of breast: Secondary | ICD-10-CM

## 2012-04-19 ENCOUNTER — Emergency Department (HOSPITAL_BASED_OUTPATIENT_CLINIC_OR_DEPARTMENT_OTHER)
Admission: EM | Admit: 2012-04-19 | Discharge: 2012-04-20 | Disposition: A | Discharge status: Home / Self Care | Payer: BC Managed Care – PPO | Attending: Emergency Medicine | Admitting: Emergency Medicine

## 2012-04-19 ENCOUNTER — Encounter (HOSPITAL_BASED_OUTPATIENT_CLINIC_OR_DEPARTMENT_OTHER): Payer: Self-pay | Admitting: *Deleted

## 2012-04-19 DIAGNOSIS — R42 Dizziness and giddiness: Secondary | ICD-10-CM | POA: Insufficient documentation

## 2012-04-19 DIAGNOSIS — G43901 Migraine, unspecified, not intractable, with status migrainosus: Secondary | ICD-10-CM

## 2012-04-19 DIAGNOSIS — R110 Nausea: Secondary | ICD-10-CM | POA: Insufficient documentation

## 2012-04-19 DIAGNOSIS — G43809 Other migraine, not intractable, without status migrainosus: Secondary | ICD-10-CM | POA: Insufficient documentation

## 2012-04-19 NOTE — ED Notes (Signed)
Pt states she has a hx of migraines and thought this was one of them, but has not had relief with rescue meds and has had fever of 102 last p.m. Some discomfort touching chin to chest.

## 2012-04-19 NOTE — ED Provider Notes (Signed)
History   This chart was scribed for Hanley Seamen, MD by Charolett Bumpers . The patient was seen in room MH11/MH11.    CSN: [id number]  Arrival date & time 04/19/12  2228   First MD Initiated Contact with Patient 04/19/12 2348      Chief Complaint  Patient presents with  . Headache    (Consider location/radiation/quality/duration/timing/severity/associated sxs/prior treatment) HPI Patricia Erickson is a 41 y.o. female who presents to the Emergency Department complaining of constant, severe headache for the past couple of days. Patient states that her headache is in the front and goes around to the back of her neck. Patient states that she had an associated fever of 102 last night. Patient's temp here in ED is 98.5. Patient also reports associated dizziness, nausea and light sensitivity. Patient states that she took Topamax and Thorazine with no relief. Patient states that this headache is similar to her typical migraines, but is lasting longer. No other symptoms reported.   Past Medical History  Diagnosis Date  . Polyp of colon 03/11/2007    BENIGN  . Anxiety   . Migraines     Past Surgical History  Procedure Date  . Cesarean section 2000    Family History  Problem Relation Age of Onset  . Cancer Mother     COLON  . Osteoporosis Mother   . Breast cancer Mother 17  . Hypertension Father   . Osteoporosis Maternal Grandmother     History  Substance Use Topics  . Smoking status: Former Games developer  . Smokeless tobacco: Never Used  . Alcohol Use: Yes     OCCASIONALLY    OB History    Grav Para Term Preterm Abortions TAB SAB Ect Mult Living   2 1   1  1   1       Review of Systems  HENT: Negative for sore throat and rhinorrhea.   Eyes:       Light sensitivity.   Respiratory: Negative for cough and shortness of breath.   Gastrointestinal: Positive for nausea. Negative for vomiting, abdominal pain and diarrhea.  Genitourinary: Negative for dysuria.  Neurological:  Positive for dizziness and headaches.  All other systems reviewed and are negative.    Allergies  Propoxyphene-acetaminophen  Home Medications   Current Outpatient Rx  Name Route Sig Dispense Refill  . CHLORPROMAZINE HCL 25 MG PO TABS Oral Take 25 mg by mouth 3 (three) times daily.    . CYCLOBENZAPRINE HCL 10 MG PO TABS Oral Take 10 mg by mouth 3 (three) times daily as needed.      Marland Kitchen GABAPENTIN 100 MG PO CAPS Oral Take 100 mg by mouth 3 (three) times daily.      . TOPIRAMATE 100 MG PO TABS Oral Take 150 mg by mouth daily.     Marland Kitchen LEVONORGESTREL 20 MCG/24HR IU IUD Intrauterine 1 each by Intrauterine route once. INSERTED 05/06/2007       BP 121/78  Pulse 118  Temp(Src) 98.5 F (36.9 C) (Oral)  Resp 20  Ht 5\' 6"  (1.676 m)  Wt 185 lb (83.915 kg)  BMI 29.86 kg/m2  SpO2 100%  Physical Exam  Nursing note and vitals reviewed. Constitutional: She is oriented to person, place, and time. She appears well-developed and well-nourished. No distress.  HENT:  Head: Normocephalic and atraumatic.  Right Ear: External ear normal.  Left Ear: External ear normal.  Nose: Nose normal.  Mouth/Throat: Oropharynx is clear and moist. No oropharyngeal exudate.  No pharyngeal erythema.   Eyes: Conjunctivae and EOM are normal. Pupils are equal, round, and reactive to light.  Fundoscopic exam:      The right eye shows no papilledema.       The left eye shows no papilledema.       Disc margins sharp bilaterally.   Neck: Normal range of motion. Neck supple. No tracheal deviation present.       No meningeal signs.   Cardiovascular: Normal rate, regular rhythm and normal heart sounds.   Pulmonary/Chest: Effort normal and breath sounds normal. No respiratory distress.  Abdominal: Soft. Bowel sounds are normal. She exhibits no distension. There is no tenderness.  Musculoskeletal: Normal range of motion. She exhibits no edema.       Normal finger to nose. Negative Romberg's.   Lymphadenopathy:    She  has no cervical adenopathy.  Neurological: She is alert and oriented to person, place, and time. No sensory deficit. She displays a negative Romberg sign. Coordination normal.  Skin: Skin is warm and dry.  Psychiatric: She has a normal mood and affect. Her speech is normal and behavior is normal.    ED Course  Procedures (including critical care time)  DIAGNOSTIC STUDIES: Oxygen Saturation is 100% on room air, normal by my interpretation.    COORDINATION OF CARE:  2358: Discussed planned course of treatment with the patient who is agreeable at this time.     MDM   Nursing notes and vitals signs, including pulse oximetry, reviewed.  Summary of this visit's results, reviewed by myself:  Labs:  Results for orders placed during the hospital encounter of 04/19/12  URINALYSIS, ROUTINE W REFLEX MICROSCOPIC      Component Value Range   Color, Urine YELLOW  YELLOW    APPearance HAZY (*) CLEAR    Specific Gravity, Urine 1.023  1.005 - 1.030    pH 6.5  5.0 - 8.0    Glucose, UA NEGATIVE  NEGATIVE (mg/dL)   Hgb urine dipstick NEGATIVE  NEGATIVE    Bilirubin Urine NEGATIVE  NEGATIVE    Ketones, ur NEGATIVE  NEGATIVE (mg/dL)   Protein, ur NEGATIVE  NEGATIVE (mg/dL)   Urobilinogen, UA 1.0  0.0 - 1.0 (mg/dL)   Nitrite NEGATIVE  NEGATIVE    Leukocytes, UA NEGATIVE  NEGATIVE   PREGNANCY, URINE      Component Value Range   Preg Test, Ur NEGATIVE  NEGATIVE     Imaging Studies: Ct Head Wo Contrast  04/20/2012  *RADIOLOGY REPORT*  Clinical Data: Fever.  Headaches.  Migraines.  CT HEAD WITHOUT CONTRAST  Technique:  Contiguous axial images were obtained from the base of the skull through the vertex without contrast.  Comparison: None.  Findings: The brain stem, cerebellum, cerebral peduncles, thalami, basal ganglia, basilar cisterns, and ventricular system appear unremarkable.  IMPRESSION:  No significant abnormality identified.  Original Report Authenticated By: Dellia Cloud, M.D.    3:55 AM Partial relief with Benadryl, Reglan and fentanyl IV.  4:41 AM Headache further improved with IV Toradol. She states her headache is about 50% as severe as when she arrived. She states this is adequate for her to go home and she will contact her headache specialist later this morning.  I personally performed the services described in this documentation, which was scribed in my presence.  The recorded information has been reviewed and considered. She has been afebrile in the ED.         Hanley Seamen, MD 04/20/12 [phone]

## 2012-04-19 NOTE — ED Notes (Signed)
Pt presents to ED today with c/o HA for the last few days.  Pt describes as "worst ever" and states no relief with home meds.

## 2012-04-20 ENCOUNTER — Emergency Department (INDEPENDENT_AMBULATORY_CARE_PROVIDER_SITE_OTHER): Payer: BC Managed Care – PPO

## 2012-04-20 DIAGNOSIS — G43909 Migraine, unspecified, not intractable, without status migrainosus: Secondary | ICD-10-CM

## 2012-04-20 DIAGNOSIS — R51 Headache: Secondary | ICD-10-CM

## 2012-04-20 DIAGNOSIS — R509 Fever, unspecified: Secondary | ICD-10-CM

## 2012-04-20 LAB — URINALYSIS, ROUTINE W REFLEX MICROSCOPIC
Bilirubin Urine: NEGATIVE
Glucose, UA: NEGATIVE mg/dL
Hgb urine dipstick: NEGATIVE
Ketones, ur: NEGATIVE mg/dL
Leukocytes, UA: NEGATIVE
Nitrite: NEGATIVE
Protein, ur: NEGATIVE mg/dL
Specific Gravity, Urine: 1.023 (ref 1.005–1.030)
Urobilinogen, UA: 1 mg/dL (ref 0.0–1.0)
pH: 6.5 (ref 5.0–8.0)

## 2012-04-20 LAB — PREGNANCY, URINE: Preg Test, Ur: NEGATIVE

## 2012-04-20 MED ORDER — KETOROLAC TROMETHAMINE 30 MG/ML IJ SOLN
30.0000 mg | Freq: Once | INTRAMUSCULAR | Status: AC
  Administered 2012-04-20: 30 mg via INTRAVENOUS
  Filled 2012-04-20: qty 1

## 2012-04-20 MED ORDER — DIPHENHYDRAMINE HCL 50 MG/ML IJ SOLN
25.0000 mg | Freq: Once | INTRAMUSCULAR | Status: AC
  Administered 2012-04-20: 25 mg via INTRAVENOUS
  Filled 2012-04-20: qty 1

## 2012-04-20 MED ORDER — FENTANYL CITRATE 0.05 MG/ML IJ SOLN
50.0000 ug | Freq: Once | INTRAMUSCULAR | Status: AC
  Administered 2012-04-20: 50 ug via INTRAVENOUS
  Filled 2012-04-20: qty 2

## 2012-04-20 MED ORDER — SODIUM CHLORIDE 0.9 % IV BOLUS (SEPSIS)
1000.0000 mL | Freq: Once | INTRAVENOUS | Status: AC
  Administered 2012-04-20: 1000 mL via INTRAVENOUS

## 2012-04-20 MED ORDER — METOCLOPRAMIDE HCL 5 MG/ML IJ SOLN
10.0000 mg | Freq: Once | INTRAMUSCULAR | Status: AC
  Administered 2012-04-20: 10 mg via INTRAVENOUS
  Filled 2012-04-20: qty 2

## 2012-04-20 MED ORDER — POTASSIUM CHLORIDE 10 MEQ/100ML IV SOLN: 10.0000 meq | Freq: Once | INTRAVENOUS | Status: DC

## 2012-10-12 ENCOUNTER — Encounter: Payer: Self-pay | Admitting: Gynecology

## 2012-10-12 ENCOUNTER — Ambulatory Visit (INDEPENDENT_AMBULATORY_CARE_PROVIDER_SITE_OTHER): Payer: BC Managed Care – PPO | Admitting: Gynecology

## 2012-10-12 VITALS — BP 120/80 | Ht 65.25 in | Wt 165.0 lb

## 2012-10-12 DIAGNOSIS — R634 Abnormal weight loss: Secondary | ICD-10-CM

## 2012-10-12 DIAGNOSIS — J40 Bronchitis, not specified as acute or chronic: Secondary | ICD-10-CM

## 2012-10-12 DIAGNOSIS — Z803 Family history of malignant neoplasm of breast: Secondary | ICD-10-CM | POA: Insufficient documentation

## 2012-10-12 DIAGNOSIS — Z01419 Encounter for gynecological examination (general) (routine) without abnormal findings: Secondary | ICD-10-CM

## 2012-10-12 DIAGNOSIS — Z800 Family history of malignant neoplasm of digestive organs: Secondary | ICD-10-CM | POA: Insufficient documentation

## 2012-10-12 LAB — CBC WITH DIFFERENTIAL/PLATELET
Basophils Absolute: 0 10*3/uL (ref 0.0–0.1)
Basophils Relative: 0 % (ref 0–1)
Eosinophils Absolute: 0.1 10*3/uL (ref 0.0–0.7)
Eosinophils Relative: 1 % (ref 0–5)
HCT: 41.8 % (ref 36.0–46.0)
Hemoglobin: 14.2 g/dL (ref 12.0–15.0)
Lymphocytes Relative: 32 % (ref 12–46)
Lymphs Abs: 3 10*3/uL (ref 0.7–4.0)
MCH: 30.5 pg (ref 26.0–34.0)
MCHC: 34 g/dL (ref 30.0–36.0)
MCV: 89.9 fL (ref 78.0–100.0)
Monocytes Absolute: 0.7 10*3/uL (ref 0.1–1.0)
Monocytes Relative: 7 % (ref 3–12)
Neutro Abs: 5.5 10*3/uL (ref 1.7–7.7)
Neutrophils Relative %: 60 % (ref 43–77)
Platelets: 370 10*3/uL (ref 150–400)
RBC: 4.65 MIL/uL (ref 3.87–5.11)
RDW: 13.1 % (ref 11.5–15.5)
WBC: 9.4 10*3/uL (ref 4.0–10.5)

## 2012-10-12 MED ORDER — CEFUROXIME AXETIL 500 MG PO TABS: 500.0000 mg | ORAL_TABLET | Freq: Two times a day (BID) | ORAL | Status: DC

## 2012-10-12 NOTE — Progress Notes (Signed)
Patricia Erickson 04-Apr-1971 [id number]   History:    41 y.o.  for annual gyn exam with a complaint of one-week history of nonproductive cough. She denied any fever chills nausea vomiting some postnasal drip. Review of patient's records indicated she had a Mirena IUD placed in 2008 is scheduled to have it removed. Patient with strong family history of colon cancer (mother with colon cancer, and with colon cancer, and sister with colonic polyps) and breast cancer in her mother. Patient had past history of benign colonic polyps removed in the past. Her last colonoscopy 2011 was normal. Patient's last mammogram was normal in 2013 she frequently does her self breast examination. Her mother also has had history of osteoporosis. Her last normal Pap smear was in 2012.  Past medical history,surgical history, family history and social history were all reviewed and documented in the EPIC chart.  Gynecologic History No LMP recorded. Patient is not currently having periods (Reason: IUD). Contraception: IUD Last Pap: 2012. Results were: normal Last mammogram: 2013. Results were: normal  Obstetric History OB History    Grav Para Term Preterm Abortions TAB SAB Ect Mult Living   2 1 1  1  1   1      # Outc Date GA Lbr Len/2nd Wgt Sex Del Anes PTL Lv   1 TRM     M CS  No Yes   2 SAB                ROS: A ROS was performed and pertinent positives and negatives are included in the history.  GENERAL: No fevers or chills. HEENT: No change in vision, no earache, sore throat or sinus congestion. NECK: No pain or stiffness. CARDIOVASCULAR: No chest pain or pressure. No palpitations. PULMONARY: No shortness of breath, cough or wheeze. GASTROINTESTINAL: No abdominal pain, nausea, vomiting or diarrhea, melena or bright red blood per rectum. GENITOURINARY: No urinary frequency, urgency, hesitancy or dysuria. MUSCULOSKELETAL: No joint or muscle pain, no back pain, no recent trauma. DERMATOLOGIC: No rash, no itching, no  lesions. ENDOCRINE: No polyuria, polydipsia, no heat or cold intolerance. No recent change in weight. HEMATOLOGICAL: No anemia or easy bruising or bleeding. NEUROLOGIC: No headache, seizures, numbness, tingling or weakness. PSYCHIATRIC: No depression, no loss of interest in normal activity or change in sleep pattern.     Exam: chaperone present  BP 120/80  Ht 5' 5.25" (1.657 m)  Wt 165 lb (74.844 kg)  BMI 27.25 kg/m2  Body mass index is 27.25 kg/(m^2).  General appearance : Well developed well nourished female. No acute distress HEENT: Neck supple, trachea midline, no carotid bruits, no thyroidmegaly Lungs: Inspiratory rhonchi or lung base left greater than right but no wheezing. Heart: Regular rate and rhythm, no murmurs or gallops Breast:Examined in sitting and supine position were symmetrical in appearance, no palpable masses or tenderness,  no skin retraction, no nipple inversion, no nipple discharge, no skin discoloration, no axillary or supraclavicular lymphadenopathy Abdomen: no palpable masses or tenderness, no rebound or guarding Extremities: no edema or skin discoloration or tenderness  Pelvic:  Bartholin, Urethra, Skene Glands: Within normal limits             Vagina: No gross lesions or discharge  Cervix: No gross lesions or discharge, IUD string seen  Uterus  anteverted, normal size, shape and consistency, non-tender and mobile  Adnexa  Without masses or tenderness  Anus and perineum  normal   Rectovaginal  normal sphincter tone without palpated masses or  tenderness             Hemoccult cards provided     Assessment/Plan:  41 y.o. female for annual exam who will return back to the office in one to 2 weeks to have the Mirena IUD changed. Hemoccult cards were presented to the office to submit to the office for testing. Literature information on testing for Lynch syndrome (hereditary non-polyposis colorectal cancer) was provided. The following labs were drawn today: CBC,  cholesterol, hemoglobin A1c, TSH, along with urinalysis. New Pap smear screening guidelines discussed. No Pap smear done today. Patient instructed to take calcium and vitamin D for osteoporosis prevention. She has lost 35 pounds is last year and she's eating healthier and exercising regularly. For her bronchitis she was given a prescription for Ceftin 500 mg to take 1 by mouth twice a day for 7 days.    Ok Edwards MD, 4:38 PM 10/12/2012

## 2012-10-12 NOTE — Patient Instructions (Addendum)
Tetanus, Diphtheria, Pertussis (Tdap) Vaccine What You Need to Know WHY GET VACCINATED? Tetanus, diphtheria and pertussis can be very serious diseases, even for adolescents and adults. Tdap vaccine can protect us from these diseases. TETANUS (Lockjaw) causes painful muscle tightening and stiffness, usually all over the body.  It can lead to tightening of muscles in the head and neck so you can't open your mouth, swallow, or sometimes even breathe. Tetanus kills about 1 out of 5 people who are infected. DIPHTHERIA can cause a thick coating to form in the back of the throat.  It can lead to breathing problems, paralysis, heart failure, and death. PERTUSSIS (Whooping Cough) causes severe coughing spells, which can cause difficulty breathing, vomiting and disturbed sleep.  It can also lead to weight loss, incontinence, and rib fractures. Up to 2 in 100 adolescents and 5 in 100 adults with pertussis are hospitalized or have complications, which could include pneumonia and death. These diseases are caused by bacteria. Diphtheria and pertussis are spread from person to person through coughing or sneezing. Tetanus enters the body through cuts, scratches, or wounds. Before vaccines, the [place] saw as many as 200,000 cases a year of diphtheria and pertussis, and hundreds of cases of tetanus. Since vaccination began, tetanus and diphtheria have dropped by about 99% and pertussis by about 80%. TDAP VACCINE Tdap vaccine can protect adolescents and adults from tetanus, diphtheria, and pertussis. One dose of Tdap is routinely given at age 11 or 12. People who did not get Tdap at that age should get it as soon as possible. Tdap is especially important for health care professionals and anyone having close contact with a baby younger than 12 months. Pregnant women should get a dose of Tdap during every pregnancy, to protect the newborn from pertussis. Infants are most at risk for severe, life-threatening  complications from pertussis. A similar vaccine, called Td, protects from tetanus and diphtheria, but not pertussis. A Td booster should be given every 10 years. Tdap may be given as one of these boosters if you have not already gotten a dose. Tdap may also be given after a severe cut or burn to prevent tetanus infection. Your doctor can give you more information. Tdap may safely be given at the same time as other vaccines. SOME PEOPLE SHOULD NOT GET THIS VACCINE  If you ever had a life-threatening allergic reaction after a dose of any tetanus, diphtheria, or pertussis containing vaccine, OR if you have a severe allergy to any part of this vaccine, you should not get Tdap. Tell your doctor if you have any severe allergies.  If you had a coma, or long or multiple seizures within 7 days after a childhood dose of DTP or DTaP, you should not get Tdap, unless a cause other than the vaccine was found. You can still get Td.  Talk to your doctor if you:  have epilepsy or another nervous system problem,  had severe pain or swelling after any vaccine containing diphtheria, tetanus or pertussis,  ever had Guillain-Barr Syndrome (GBS),  aren't feeling well on the day the shot is scheduled. RISKS OF A VACCINE REACTION With any medicine, including vaccines, there is a chance of side effects. These are usually mild and go away on their own, but serious reactions are also possible. Brief fainting spells can follow a vaccination, leading to injuries from falling. Sitting or lying down for about 15 minutes can help prevent these. Tell your doctor if you feel dizzy or light-headed, or   have vision changes or ringing in the ears. Mild problems following Tdap (Did not interfere with activities)  Pain where the shot was given (about 3 in 4 adolescents or 2 in 3 adults)  Redness or swelling where the shot was given (about 1 person in 5)  Mild fever of at least 100.4F (up to about 1 in 25 adolescents or 1 in  100 adults)  Headache (about 3 or 4 people in 10)  Tiredness (about 1 person in 3 or 4)  Nausea, vomiting, diarrhea, stomach ache (up to 1 in 4 adolescents or 1 in 10 adults)  Chills, body aches, sore joints, rash, swollen glands (uncommon) Moderate problems following Tdap (Interfered with activities, but did not require medical attention)  Pain where the shot was given (about 1 in 5 adolescents or 1 in 100 adults)  Redness or swelling where the shot was given (up to about 1 in 16 adolescents or 1 in 25 adults)  Fever over 102F (about 1 in 100 adolescents or 1 in 250 adults)  Headache (about 3 in 20 adolescents or 1 in 10 adults)  Nausea, vomiting, diarrhea, stomach ache (up to 1 or 3 people in 100)  Swelling of the entire arm where the shot was given (up to about 3 in 100). Severe problems following Tdap (Unable to perform usual activities, required medical attention)  Swelling, severe pain, bleeding and redness in the arm where the shot was given (rare). A severe allergic reaction could occur after any vaccine (estimated less than 1 in a million doses). WHAT IF THERE IS A SERIOUS REACTION? What should I look for?  Look for anything that concerns you, such as signs of a severe allergic reaction, very high fever, or behavior changes. Signs of a severe allergic reaction can include hives, swelling of the face and throat, difficulty breathing, a fast heartbeat, dizziness, and weakness. These would start a few minutes to a few hours after the vaccination. What should I do?  If you think it is a severe allergic reaction or other emergency that can't wait, call 9-1-1 or get the person to the nearest hospital. Otherwise, call your doctor.  Afterward, the reaction should be reported to the "Vaccine Adverse Event Reporting System" (VAERS). Your doctor might file this report, or you can do it yourself through the VAERS web site at www.vaers.hhs.gov, or by calling 1-800-822-7967. VAERS is  only for reporting reactions. They do not give medical advice.  THE NATIONAL VACCINE INJURY COMPENSATION PROGRAM The National Vaccine Injury Compensation Program (VICP) is a federal program that was created to compensate people who may have been injured by certain vaccines. Persons who believe they may have been injured by a vaccine can learn about the program and about filing a claim by calling 1-800-338-2382 or visiting the VICP website at www.hrsa.gov/vaccinecompensation. HOW CAN I LEARN MORE?  Ask your doctor.  Call your local or state health department.  Contact the Centers for Disease Control and Prevention (CDC):  Call 1-800-232-4636 or visit CDC's website at www.cdc.gov/vaccines CDC Tdap Vaccine VIS (04/16/12) Document Released: 05/26/2012 Document Reviewed: 05/26/2012 ExitCare Patient Information 2013 [employer].  Bronchitis Bronchitis is the body's way of reacting to injury and/or infection (inflammation) of the bronchi. Bronchi are the air tubes that extend from the windpipe into the lungs. If the inflammation becomes severe, it may cause shortness of breath. CAUSES  Inflammation may be caused by:  A virus.  Germs (bacteria).  Dust.  Allergens.  Pollutants and many other irritants.   The cells lining the bronchial tree are covered with tiny hairs (cilia). These constantly beat upward, away from the lungs, toward the mouth. This keeps the lungs free of pollutants. When these cells become too irritated and are unable to do their job, mucus begins to develop. This causes the characteristic cough of bronchitis. The cough clears the lungs when the cilia are unable to do their job. Without either of these protective mechanisms, the mucus would settle in the lungs. Then you would develop pneumonia. Smoking is a common cause of bronchitis and can contribute to pneumonia. Stopping this habit is the single most important thing you can do to help yourself. TREATMENT   Your  caregiver may prescribe an antibiotic if the cough is caused by bacteria. Also, medicines that open up your airways make it easier to breathe. Your caregiver may also recommend or prescribe an expectorant. It will loosen the mucus to be coughed up. Only take over-the-counter or prescription medicines for pain, discomfort, or fever as directed by your caregiver.  Removing whatever causes the problem (smoking, for example) is critical to preventing the problem from getting worse.  Cough suppressants may be prescribed for relief of cough symptoms.  Inhaled medicines may be prescribed to help with symptoms now and to help prevent problems from returning.  For those with recurrent (chronic) bronchitis, there may be a need for steroid medicines. SEEK IMMEDIATE MEDICAL CARE IF:   During treatment, you develop more pus-like mucus (purulent sputum).  You have a fever.  Your baby is older than 3 months with a rectal temperature of 102 F (38.9 C) or higher.  Your baby is 3 months old or younger with a rectal temperature of 100.4 F (38 C) or higher.  You become progressively more ill.  You have increased difficulty breathing, wheezing, or shortness of breath. It is necessary to seek immediate medical care if you are elderly or sick from any other disease. MAKE SURE YOU:   Understand these instructions.  Will watch your condition.  Will get help right away if you are not doing well or get worse. Document Released: 11/25/2005 Document Revised: 02/17/2012 Document Reviewed: 10/04/2008 ExitCare Patient Information 2013 [employer].   

## 2012-10-13 ENCOUNTER — Other Ambulatory Visit: Payer: Self-pay | Admitting: Gynecology

## 2012-10-13 DIAGNOSIS — Z3049 Encounter for surveillance of other contraceptives: Secondary | ICD-10-CM

## 2012-10-13 LAB — URINALYSIS W MICROSCOPIC + REFLEX CULTURE
Bacteria, UA: NONE SEEN
Bilirubin Urine: NEGATIVE
Casts: NONE SEEN
Glucose, UA: NEGATIVE mg/dL
Hgb urine dipstick: NEGATIVE
Ketones, ur: NEGATIVE mg/dL
Nitrite: NEGATIVE
Protein, ur: NEGATIVE mg/dL
Specific Gravity, Urine: 1.013 (ref 1.005–1.030)
Squamous Epithelial / LPF: NONE SEEN
Urobilinogen, UA: 0.2 mg/dL (ref 0.0–1.0)
pH: 6 (ref 5.0–8.0)

## 2012-10-13 LAB — CHOLESTEROL, TOTAL: Cholesterol: 149 mg/dL (ref 0–200)

## 2012-10-13 LAB — HEMOGLOBIN A1C
Hgb A1c MFr Bld: 5.4 % (ref <5.7)
Mean Plasma Glucose: 108 mg/dL (ref <117)

## 2012-10-13 LAB — TSH: TSH: 0.356 u[IU]/mL (ref 0.350–4.500)

## 2012-10-13 MED ORDER — LEVONORGESTREL 20 MCG/24HR IU IUD: INTRAUTERINE_SYSTEM | Freq: Once | INTRAUTERINE | Status: DC

## 2012-10-15 LAB — URINE CULTURE: Colony Count: 35000

## 2012-10-29 ENCOUNTER — Encounter: Payer: Self-pay | Admitting: Gynecology

## 2012-10-29 ENCOUNTER — Ambulatory Visit (INDEPENDENT_AMBULATORY_CARE_PROVIDER_SITE_OTHER): Payer: BC Managed Care – PPO | Admitting: Gynecology

## 2012-10-29 VITALS — BP 124/86

## 2012-10-29 DIAGNOSIS — Z30433 Encounter for removal and reinsertion of intrauterine contraceptive device: Secondary | ICD-10-CM

## 2012-10-29 NOTE — Patient Instructions (Addendum)
Intrauterine Device Information  An intrauterine device (IUD) is inserted into your uterus and prevents pregnancy. There are 2 types of IUDs available:  · Copper IUD. This type of IUD is wrapped in copper wire and is placed inside the uterus. Copper makes the uterus and fallopian tubes produce a fluid that kills sperm. The copper IUD can stay in place for 10 years.  · Hormone IUD. This type of IUD contains the hormone progestin (synthetic progesterone). The hormone thickens the cervical mucus and prevents sperm from entering the uterus, and it also thins the uterine lining to prevent implantation of a fertilized egg. The hormone can weaken or kill the sperm that get into the uterus. The hormone IUD can stay in place for 5 years.  Your caregiver will make sure you are a good candidate for a contraceptive IUD. Discuss with your caregiver the possible side effects.  ADVANTAGES  · It is highly effective, reversible, long-acting, and low maintenance.  · There are no estrogen-related side effects.  · An IUD can be used when breastfeeding.  · It is not associated with weight gain.  · It works immediately after insertion.  · The copper IUD does not interfere with your female hormones.  · The progesterone IUD can make heavy menstrual periods lighter.  · The progesterone IUD can be used for 5 years.  · The copper IUD can be used for 10 years.  DISADVANTAGES  · The progesterone IUD can be associated with irregular bleeding patterns.  · The copper IUD can make your menstrual flow heavier and more painful.  · You may experience cramping and vaginal bleeding after insertion.  Document Released: 10/29/2004 Document Revised: 02/17/2012 Document Reviewed: 03/30/2011  ExitCare® Patient Information ©2013 [employer].

## 2012-10-29 NOTE — Progress Notes (Signed)
Patient is a 41 year old who presented to the office today to remove her overdue Mirena IUD that was placed in 2008. She wanted to replace the IUD with a new one. She was seen recently for her annual gynecological examination see previous note. Patient was counseled as to risk benefits and pros and cons of the IUD. Patient fully where this form of contraception is 99% effective and is good for 5 years.  Exam: Pelvic Bartholin urethra Skene was within normal limits Vagina: No lesions or discharge Cervix: IUD string seen in Uterus: Anteverted normal size shape and consistency Adnexa: No palpable masses or tenderness Rectal exam not done  Procedure note: The cervix was cleansed with Betadine solution and the IUD string was grasped with a [place] clamp retrieved shown to the patient and discarded. A single-tooth tenaculum was placed in on the anterior cervical lip. The uterus sounded to 7-1/2 cm. A new Mirena IUD was inserted into the intrauterine cavity and the string was trimmed. Single-tooth tenaculum was removed. Patient tolerated procedure well and will return back in one month for followup.

## 2012-10-30 ENCOUNTER — Encounter: Payer: Self-pay | Admitting: Gynecology

## 2012-11-24 ENCOUNTER — Ambulatory Visit (INDEPENDENT_AMBULATORY_CARE_PROVIDER_SITE_OTHER): Payer: BC Managed Care – PPO | Admitting: Gynecology

## 2012-11-24 ENCOUNTER — Encounter: Payer: Self-pay | Admitting: Gynecology

## 2012-11-24 VITALS — BP 110/68

## 2012-11-24 DIAGNOSIS — Z30431 Encounter for routine checking of intrauterine contraceptive device: Secondary | ICD-10-CM

## 2012-11-24 NOTE — Progress Notes (Signed)
Patient presented to the office for one month followup after having replaced her Mirena IUD on 10/29/2012. Patient is doing well with no complaints.  Exam: Bartholin urethra Skene was within normal limits Vagina: No lesions or discharge Cervix: IUD string seen no lesion seen Uterus: Anteverted normal size shape and consistency Adnexa: No palpable masses or tenderness Rectal exam: Not done  Assessment/plan. Patient doing well one month post replacement of Mirena IUD. Patient scheduled to return back next year for her annual exam. Patient scheduled for mammogram next month.

## 2012-11-30 ENCOUNTER — Ambulatory Visit: Payer: BC Managed Care – PPO | Admitting: Gynecology

## 2013-04-21 ENCOUNTER — Telehealth: Payer: Self-pay | Admitting: *Deleted

## 2013-04-21 MED ORDER — DOXYCYCLINE HYCLATE 100 MG PO CAPS: 100.0000 mg | ORAL_CAPSULE | Freq: Two times a day (BID) | ORAL | Status: DC

## 2013-04-21 MED ORDER — ESTRADIOL 1 MG PO TABS: 1.0000 mg | ORAL_TABLET | Freq: Every day | ORAL | Status: DC

## 2013-04-21 NOTE — Telephone Encounter (Signed)
Have patient do a urine pregnancy test at home. If urine pregnancy test is negative please call in a prescription for Estrace 1 mg to take 1 by mouth daily for 30 days. Also please call in a prescription for Vibramycin 100 mg one by mouth twice a day for 7 days in the event of an inflammation of the uterine lining from the IUD.

## 2013-04-21 NOTE — Telephone Encounter (Signed)
Pt called stating she is having a period with IUD, placement was on 10/29/12. Pt said she has had spotting before, but never a cycle, no heavy, with cramping as well, started this past Sunday. Please advise

## 2013-04-21 NOTE — Telephone Encounter (Signed)
Pt informed with the below, rx sent, she will check UPT in am with first urine and if negative start the below rx's. Pt will call if test reads positive.

## 2013-04-23 ENCOUNTER — Other Ambulatory Visit: Payer: Self-pay

## 2013-04-23 DIAGNOSIS — Z1231 Encounter for screening mammogram for malignant neoplasm of breast: Secondary | ICD-10-CM

## 2013-05-27 ENCOUNTER — Ambulatory Visit
Admission: RE | Admit: 2013-05-27 | Discharge: 2013-05-27 | Disposition: A | Discharge status: Home / Self Care | Payer: BC Managed Care – PPO | Source: Ambulatory Visit

## 2013-05-27 DIAGNOSIS — Z1231 Encounter for screening mammogram for malignant neoplasm of breast: Secondary | ICD-10-CM

## 2013-10-13 ENCOUNTER — Ambulatory Visit (INDEPENDENT_AMBULATORY_CARE_PROVIDER_SITE_OTHER): Payer: BC Managed Care – PPO | Admitting: Women's Health

## 2013-10-13 ENCOUNTER — Encounter: Payer: Self-pay | Admitting: Women's Health

## 2013-10-13 DIAGNOSIS — L738 Other specified follicular disorders: Secondary | ICD-10-CM

## 2013-10-13 DIAGNOSIS — L739 Follicular disorder, unspecified: Secondary | ICD-10-CM

## 2013-10-13 NOTE — Progress Notes (Signed)
Patient ID: Patricia Erickson, female   DOB: 05-18-1971, 42 y.o.   MRN: [id number] Presents with complaint of vaginal discomfort questionable swollen gland for past 2 days. Denies HSV, urinary symptoms, discharge with itching or odor, same partner. Mirena IUD with minimal bleeding.   Exam: Appears well. External genitalia right  lower labia adjacent to vagina .5 cm folliculitis. No cluster or drainage. Minimal discomfort with palpation. Speculum exam minimal discharge with no erythema or odor noted. IUD strings visible at os.  Probable folliculitis  Plan: Triple Antibiotic ointment applied, instructed to call or return if cluster/posterior noted, area does not resolve.

## 2013-10-29 ENCOUNTER — Encounter: Payer: Self-pay | Admitting: Gynecology

## 2013-11-02 ENCOUNTER — Encounter: Payer: Self-pay | Admitting: Gynecology

## 2013-11-02 ENCOUNTER — Ambulatory Visit (INDEPENDENT_AMBULATORY_CARE_PROVIDER_SITE_OTHER): Payer: BC Managed Care – PPO | Admitting: Gynecology

## 2013-11-02 VITALS — BP 110/70 | Ht 65.5 in | Wt 150.0 lb

## 2013-11-02 DIAGNOSIS — Z800 Family history of malignant neoplasm of digestive organs: Secondary | ICD-10-CM

## 2013-11-02 DIAGNOSIS — R634 Abnormal weight loss: Secondary | ICD-10-CM | POA: Insufficient documentation

## 2013-11-02 DIAGNOSIS — Z803 Family history of malignant neoplasm of breast: Secondary | ICD-10-CM

## 2013-11-02 DIAGNOSIS — Z01419 Encounter for gynecological examination (general) (routine) without abnormal findings: Secondary | ICD-10-CM

## 2013-11-02 DIAGNOSIS — Z113 Encounter for screening for infections with a predominantly sexual mode of transmission: Secondary | ICD-10-CM

## 2013-11-02 LAB — LIPID PANEL
Cholesterol: 145 mg/dL (ref 0–200)
HDL: 65 mg/dL (ref >39)
LDL Cholesterol: 72 mg/dL (ref 0–99)
Total CHOL/HDL Ratio: 2.2 Ratio
Triglycerides: 40 mg/dL (ref <150)
VLDL: 8 mg/dL (ref 0–40)

## 2013-11-02 LAB — COMPREHENSIVE METABOLIC PANEL
ALT: 11 U/L (ref 0–35)
AST: 13 U/L (ref 0–37)
Albumin: 4.3 g/dL (ref 3.5–5.2)
Alkaline Phosphatase: 64 U/L (ref 39–117)
BUN: 18 mg/dL (ref 6–23)
CO2: 22 mEq/L (ref 19–32)
Calcium: 8.8 mg/dL (ref 8.4–10.5)
Chloride: 109 mEq/L (ref 96–112)
Creat: 0.79 mg/dL (ref 0.50–1.10)
Glucose, Bld: 86 mg/dL (ref 70–99)
Potassium: 4.1 mEq/L (ref 3.5–5.3)
Sodium: 140 mEq/L (ref 135–145)
Total Bilirubin: 0.4 mg/dL (ref 0.3–1.2)
Total Protein: 6.3 g/dL (ref 6.0–8.3)

## 2013-11-02 LAB — URINALYSIS W MICROSCOPIC + REFLEX CULTURE
Bacteria, UA: NONE SEEN
Bilirubin Urine: NEGATIVE
Casts: NONE SEEN
Crystals: NONE SEEN
Glucose, UA: NEGATIVE mg/dL
Hgb urine dipstick: NEGATIVE
Ketones, ur: NEGATIVE mg/dL
Leukocytes, UA: NEGATIVE
Nitrite: NEGATIVE
Protein, ur: NEGATIVE mg/dL
Specific Gravity, Urine: 1.017 (ref 1.005–1.030)
Squamous Epithelial / LPF: NONE SEEN
Urobilinogen, UA: 0.2 mg/dL (ref 0.0–1.0)
pH: 6 (ref 5.0–8.0)

## 2013-11-02 LAB — CBC WITH DIFFERENTIAL/PLATELET
Basophils Absolute: 0 10*3/uL (ref 0.0–0.1)
Basophils Relative: 0 % (ref 0–1)
Eosinophils Absolute: 0.1 10*3/uL (ref 0.0–0.7)
Eosinophils Relative: 1 % (ref 0–5)
HCT: 41.2 % (ref 36.0–46.0)
Hemoglobin: 14.1 g/dL (ref 12.0–15.0)
Lymphocytes Relative: 19 % (ref 12–46)
Lymphs Abs: 1.3 10*3/uL (ref 0.7–4.0)
MCH: 31.1 pg (ref 26.0–34.0)
MCHC: 34.2 g/dL (ref 30.0–36.0)
MCV: 90.7 fL (ref 78.0–100.0)
Monocytes Absolute: 0.5 10*3/uL (ref 0.1–1.0)
Monocytes Relative: 7 % (ref 3–12)
Neutro Abs: 5 10*3/uL (ref 1.7–7.7)
Neutrophils Relative %: 73 % (ref 43–77)
Platelets: 252 10*3/uL (ref 150–400)
RBC: 4.54 MIL/uL (ref 3.87–5.11)
RDW: 12.8 % (ref 11.5–15.5)
WBC: 6.9 10*3/uL (ref 4.0–10.5)

## 2013-11-02 LAB — HEPATITIS C ANTIBODY: HCV Ab: NEGATIVE

## 2013-11-02 LAB — RPR

## 2013-11-02 LAB — HIV ANTIBODY (ROUTINE TESTING W REFLEX): HIV: NONREACTIVE

## 2013-11-02 LAB — TSH: TSH: 0.6 u[IU]/mL (ref 0.350–4.500)

## 2013-11-02 LAB — HEPATITIS B SURFACE ANTIGEN: Hepatitis B Surface Ag: NEGATIVE

## 2013-11-02 NOTE — Patient Instructions (Signed)

## 2013-11-02 NOTE — Progress Notes (Signed)
Patricia Erickson Sep 08, 1971 [id number]   History:    42 y.o.  for annual gyn exam with no complaints today but wanted to have an STD screen. Patient had a Mirena IUD replaced in 2013 and is doing well with normal like menstrual cycle. Patient's family history significant for the following:  Mother with history of breast and colon cancer Patient with colon cancer Sister and patient with colon polyps benign  Patient's last colonoscopy 2011 Patient denies any past history of abnormal Pap smear  Past medical history,surgical history, family history and social history were all reviewed and documented in the EPIC chart.  Gynecologic History No LMP recorded. Patient is not currently having periods (Reason: IUD). Contraception: IUD Last Pap: 2012. Results were: normal Last mammogram: 2014. Results were: normal  Obstetric History OB History  Gravida Para Term Preterm AB SAB TAB Ectopic Multiple Living  2 1 1  1 1    1     # Outcome Date GA Lbr Len/2nd Weight Sex Delivery Anes PTL Lv  2 SAB           1 TRM     M CS  N Y       ROS: A ROS was performed and pertinent positives and negatives are included in the history.  GENERAL: No fevers or chills. HEENT: No change in vision, no earache, sore throat or sinus congestion. NECK: No pain or stiffness. CARDIOVASCULAR: No chest pain or pressure. No palpitations. PULMONARY: No shortness of breath, cough or wheeze. GASTROINTESTINAL: No abdominal pain, nausea, vomiting or diarrhea, melena or bright red blood per rectum. GENITOURINARY: No urinary frequency, urgency, hesitancy or dysuria. MUSCULOSKELETAL: No joint or muscle pain, no back pain, no recent trauma. DERMATOLOGIC: No rash, no itching, no lesions. ENDOCRINE: No polyuria, polydipsia, no heat or cold intolerance. No recent change in weight. HEMATOLOGICAL: No anemia or easy bruising or bleeding. NEUROLOGIC: No headache, seizures, numbness, tingling or weakness. PSYCHIATRIC: No depression, no loss of  interest in normal activity or change in sleep pattern.     Exam: chaperone present  BP 110/70  Ht 5' 5.5" (1.664 m)  Wt 150 lb (68.04 kg)  BMI 24.57 kg/m2  Body mass index is 24.57 kg/(m^2).  General appearance : Well developed well nourished female. No acute distress HEENT: Neck supple, trachea midline, no carotid bruits, no thyroidmegaly Lungs: Clear to auscultation, no rhonchi or wheezes, or rib retractions  Heart: Regular rate and rhythm, no murmurs or gallops Breast:Examined in sitting and supine position were symmetrical in appearance, no palpable masses or tenderness,  no skin retraction, no nipple inversion, no nipple discharge, no skin discoloration, no axillary or supraclavicular lymphadenopathy Abdomen: no palpable masses or tenderness, no rebound or guarding Extremities: no edema or skin discoloration or tenderness  Pelvic:  Bartholin, Urethra, Skene Glands: Within normal limits             Vagina: No gross lesions or discharge  Cervix: No gross lesions or discharge, IUD string seen  Uterus  anteverted, normal size, shape and consistency, non-tender and mobile  Adnexa  Without masses or tenderness  Anus and perineum  normal   Rectovaginal  normal sphincter tone without palpated masses or tenderness             Hemoccult cord provided     Assessment/Plan:  42 y.o. female for annual exam with strong family history of colon cancer and colon polyps. Patient has been screened with colonoscopy every 5 years. Hemoccult cards  were provided. The patient requesting [place] to be screened so the following was ordered: GC and chlamydia culture along with HIV, hepatitis B and C. As well as RPR. Additional annual labs included the following: CBC, fasting lipid profile, comprehensive metabolic panel, TSH and urinalysis. Patient declined flu vaccine. She will be referred to the [facility] geneticist because of multiple family members with various cancers.  Note: This  dictation was prepared with  Dragon/digital dictation along withSmart phrase technology. Any transcriptional errors that result from this process are unintentional.   Ok Edwards MD, 9:05 AM 11/02/2013

## 2013-11-03 LAB — GC/CHLAMYDIA PROBE AMP
CT Probe RNA: NEGATIVE
GC Probe RNA: NEGATIVE

## 2013-11-12 ENCOUNTER — Encounter: Payer: Self-pay | Admitting: Gynecology

## 2013-11-22 ENCOUNTER — Encounter: Payer: Self-pay | Admitting: Gynecology

## 2014-07-06 ENCOUNTER — Other Ambulatory Visit: Payer: Self-pay

## 2014-07-06 DIAGNOSIS — Z1231 Encounter for screening mammogram for malignant neoplasm of breast: Secondary | ICD-10-CM

## 2014-07-20 ENCOUNTER — Ambulatory Visit
Admission: RE | Admit: 2014-07-20 | Discharge: 2014-07-20 | Disposition: A | Discharge status: Home / Self Care | Payer: 59 | Source: Ambulatory Visit

## 2014-07-20 DIAGNOSIS — Z1231 Encounter for screening mammogram for malignant neoplasm of breast: Secondary | ICD-10-CM

## 2014-10-10 ENCOUNTER — Encounter: Payer: Self-pay | Admitting: Gynecology

## 2014-11-08 ENCOUNTER — Encounter: Payer: BC Managed Care – PPO | Admitting: Gynecology

## 2014-11-09 ENCOUNTER — Encounter: Payer: Self-pay | Admitting: Gynecology

## 2014-11-09 ENCOUNTER — Ambulatory Visit (INDEPENDENT_AMBULATORY_CARE_PROVIDER_SITE_OTHER): Payer: 59 | Admitting: Gynecology

## 2014-11-09 ENCOUNTER — Other Ambulatory Visit (HOSPITAL_COMMUNITY)
Admission: RE | Admit: 2014-11-09 | Discharge: 2014-11-09 | Disposition: A | Discharge status: Home / Self Care | Payer: 59 | Source: Ambulatory Visit | Attending: Gynecology | Admitting: Gynecology

## 2014-11-09 VITALS — BP 122/80 | Ht 65.5 in | Wt 155.5 lb

## 2014-11-09 DIAGNOSIS — Z1151 Encounter for screening for human papillomavirus (HPV): Secondary | ICD-10-CM | POA: Insufficient documentation

## 2014-11-09 DIAGNOSIS — Z01419 Encounter for gynecological examination (general) (routine) without abnormal findings: Secondary | ICD-10-CM | POA: Diagnosis not present

## 2014-11-09 DIAGNOSIS — Z23 Encounter for immunization: Secondary | ICD-10-CM

## 2014-11-09 LAB — URINALYSIS W MICROSCOPIC + REFLEX CULTURE
Bilirubin Urine: NEGATIVE
Casts: NONE SEEN
Glucose, UA: NEGATIVE mg/dL
Hgb urine dipstick: NEGATIVE
Ketones, ur: NEGATIVE mg/dL
Nitrite: POSITIVE — AB
Protein, ur: NEGATIVE mg/dL
Specific Gravity, Urine: 1.027 (ref 1.005–1.030)
Urobilinogen, UA: 0.2 mg/dL (ref 0.0–1.0)
pH: 5 (ref 5.0–8.0)

## 2014-11-09 LAB — COMPREHENSIVE METABOLIC PANEL
ALT: 22 U/L (ref 0–35)
AST: 20 U/L (ref 0–37)
Albumin: 4.3 g/dL (ref 3.5–5.2)
Alkaline Phosphatase: 69 U/L (ref 39–117)
BUN: 17 mg/dL (ref 6–23)
CO2: 20 mEq/L (ref 19–32)
Calcium: 8.9 mg/dL (ref 8.4–10.5)
Chloride: 112 mEq/L (ref 96–112)
Creat: 0.77 mg/dL (ref 0.50–1.10)
Glucose, Bld: 88 mg/dL (ref 70–99)
Potassium: 3.8 mEq/L (ref 3.5–5.3)
Sodium: 137 mEq/L (ref 135–145)
Total Bilirubin: 0.4 mg/dL (ref 0.2–1.2)
Total Protein: 6.7 g/dL (ref 6.0–8.3)

## 2014-11-09 LAB — CBC WITH DIFFERENTIAL/PLATELET
Basophils Absolute: 0 10*3/uL (ref 0.0–0.1)
Basophils Relative: 0 % (ref 0–1)
Eosinophils Absolute: 0.1 10*3/uL (ref 0.0–0.7)
Eosinophils Relative: 2 % (ref 0–5)
HCT: 40.4 % (ref 36.0–46.0)
Hemoglobin: 14 g/dL (ref 12.0–15.0)
Lymphocytes Relative: 21 % (ref 12–46)
Lymphs Abs: 1.2 10*3/uL (ref 0.7–4.0)
MCH: 30.9 pg (ref 26.0–34.0)
MCHC: 34.7 g/dL (ref 30.0–36.0)
MCV: 89.2 fL (ref 78.0–100.0)
MPV: 8.9 fL — ABNORMAL LOW (ref 9.4–12.4)
Monocytes Absolute: 0.5 10*3/uL (ref 0.1–1.0)
Monocytes Relative: 9 % (ref 3–12)
Neutro Abs: 3.9 10*3/uL (ref 1.7–7.7)
Neutrophils Relative %: 68 % (ref 43–77)
Platelets: 288 10*3/uL (ref 150–400)
RBC: 4.53 MIL/uL (ref 3.87–5.11)
RDW: 13.4 % (ref 11.5–15.5)
WBC: 5.8 10*3/uL (ref 4.0–10.5)

## 2014-11-09 LAB — TSH: TSH: 0.842 u[IU]/mL (ref 0.350–4.500)

## 2014-11-09 LAB — LIPID PANEL
Cholesterol: 165 mg/dL (ref 0–200)
HDL: 76 mg/dL (ref >39)
LDL Cholesterol: 81 mg/dL (ref 0–99)
Total CHOL/HDL Ratio: 2.2 Ratio
Triglycerides: 39 mg/dL (ref <150)
VLDL: 8 mg/dL (ref 0–40)

## 2014-11-09 NOTE — Addendum Note (Signed)
Addended by: Thurnell Garbe A on: 11/09/2014 09:05 AM   Modules accepted: Orders, SmartSet

## 2014-11-09 NOTE — Progress Notes (Signed)
Patricia Erickson 04/24/1971 [id number]   History:    43 y.o.  for annual gyn exam with no complaints today.Patient had a Mirena IUD replaced in 2013 and is doing well with normal like menstrual cycle. Patient's family history significant for the following:  Mother with history of breast and colon cancer Patient with colon cancer Sister and patient with colon polyps benign  Patient's last colonoscopy 2011 Patient denies any past history of abnormal Pap smear  Patient had a bone density study in 2007 which was normal  Past medical history,surgical history, family history and social history were all reviewed and documented in the EPIC chart.  Gynecologic History No LMP recorded. Patient is not currently having periods (Reason: IUD). Contraception: IUD Last Pap: 2012. Results were: normal Last mammogram: 2015. Results were: Patient had three-dimensional mammogram due to dense breasts with family history of breast cancer  Obstetric History OB History  Gravida Para Term Preterm AB SAB TAB Ectopic Multiple Living  2 1 1  1 1    1     # Outcome Date GA Lbr Len/2nd Weight Sex Delivery Anes PTL Lv  2 SAB           1 Term     M CS-Unspec  N Y       ROS: A ROS was performed and pertinent positives and negatives are included in the history.  GENERAL: No fevers or chills. HEENT: No change in vision, no earache, sore throat or sinus congestion. NECK: No pain or stiffness. CARDIOVASCULAR: No chest pain or pressure. No palpitations. PULMONARY: No shortness of breath, cough or wheeze. GASTROINTESTINAL: No abdominal pain, nausea, vomiting or diarrhea, melena or bright red blood per rectum. GENITOURINARY: No urinary frequency, urgency, hesitancy or dysuria. MUSCULOSKELETAL: No joint or muscle pain, no back pain, no recent trauma. DERMATOLOGIC: No rash, no itching, no lesions. ENDOCRINE: No polyuria, polydipsia, no heat or cold intolerance. No recent change in weight. HEMATOLOGICAL: No anemia or easy  bruising or bleeding. NEUROLOGIC: No headache, seizures, numbness, tingling or weakness. PSYCHIATRIC: No depression, no loss of interest in normal activity or change in sleep pattern.     Exam: chaperone present  BP 122/80 mmHg  Ht 5' 5.5" (1.664 m)  Wt 155 lb 8 oz (70.534 kg)  BMI 25.47 kg/m2  Body mass index is 25.47 kg/(m^2).  General appearance : Well developed well nourished female. No acute distress HEENT: Neck supple, trachea midline, no carotid bruits, no thyroidmegaly Lungs: Clear to auscultation, no rhonchi or wheezes, or rib retractions  Heart: Regular rate and rhythm, no murmurs or gallops Breast:Examined in sitting and supine position were symmetrical in appearance, no palpable masses or tenderness,  no skin retraction, no nipple inversion, no nipple discharge, no skin discoloration, no axillary or supraclavicular lymphadenopathy Abdomen: no palpable masses or tenderness, no rebound or guarding Extremities: no edema or skin discoloration or tenderness  Pelvic:  Bartholin, Urethra, Skene Glands: Within normal limits             Vagina: No gross lesions or discharge  Cervix: No gross lesions or discharge, IUD string seen  Uterus  anteverted, normal size, shape and consistency, non-tender and mobile  Adnexa  Without masses or tenderness  Anus and perineum  normal   Rectovaginal  normal sphincter tone without palpated masses or tenderness             Hemoccult cards provided     Assessment/Plan:  43 y.o. female for annual exam declined  flu vaccine today. Patient will receive the T dTap Vaccine today. The following labs along with Pap smear were obtained today: CBC, comprehensive metabolic panel, TSH, fasting lipid profile, and urinalysis. Patient last year as well as this year was recommended to follow-up at the [facility] geneticist because of multiple family members with various cancers but has not done so. She was provided with fecal Hemoccult cards to  submit to the office for testing. We discussed importance of monthly breast exam. We discussed importance of calcium vitamin D and regular exercise for osteoporosis prevention.   Terrance Mass MD, 8:49 AM 11/09/2014 [facility] geneticist because of multiple family members with various cancers.

## 2014-11-09 NOTE — Patient Instructions (Signed)

## 2014-11-10 LAB — URINE CULTURE: Colony Count: 65000

## 2014-11-10 LAB — CYTOLOGY - PAP

## 2014-11-14 ENCOUNTER — Other Ambulatory Visit: Payer: Self-pay | Admitting: Gynecology

## 2014-11-14 MED ORDER — AZITHROMYCIN 250 MG PO TABS: 250.0000 mg | ORAL_TABLET | Freq: Two times a day (BID) | ORAL | Status: DC

## 2014-11-21 IMAGING — MG MM SCREENING BREAST TOMO BILATERAL
9 of 12 series · 9 of 28 positions shown · non-contrast
Comparison: Previous exam(s).

CLINICAL DATA: Screening.

EXAM:
DIGITAL SCREENING BILATERAL MAMMOGRAM WITH 3D TOMO WITH CAD

[R CC synth-2D]
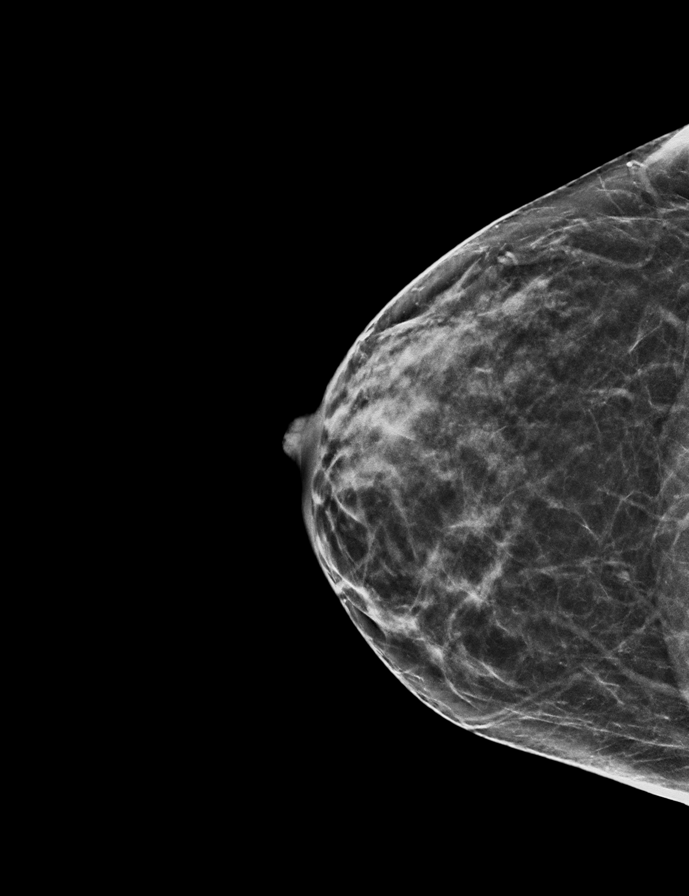

[R CC]
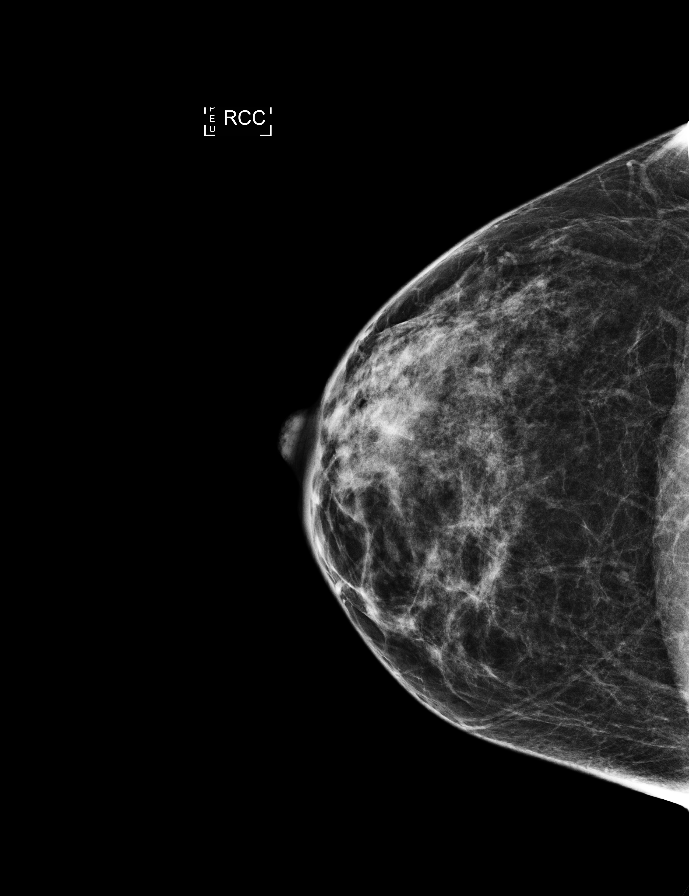

[R MLO synth-2D]
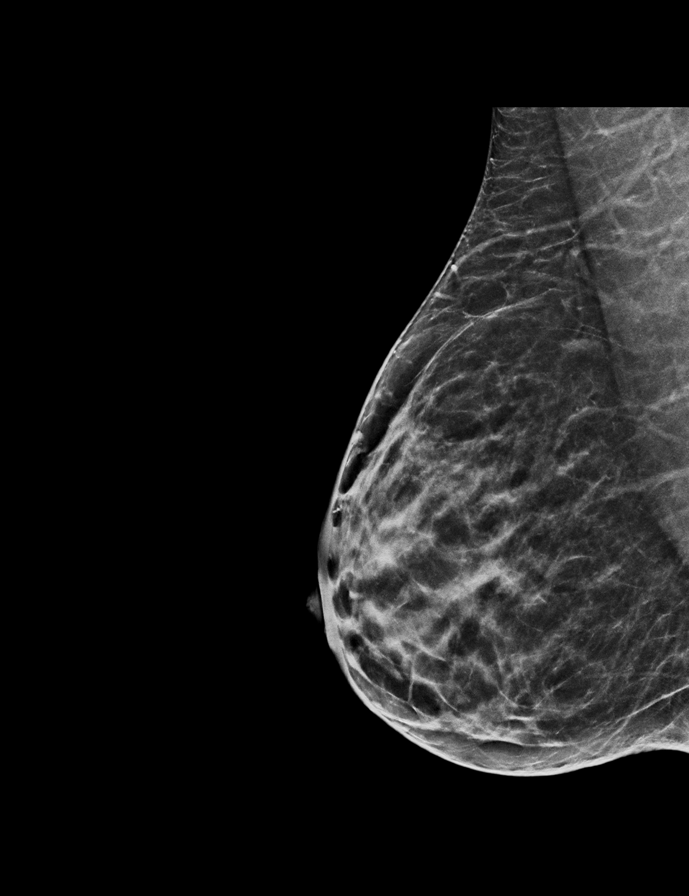

[R MLO]
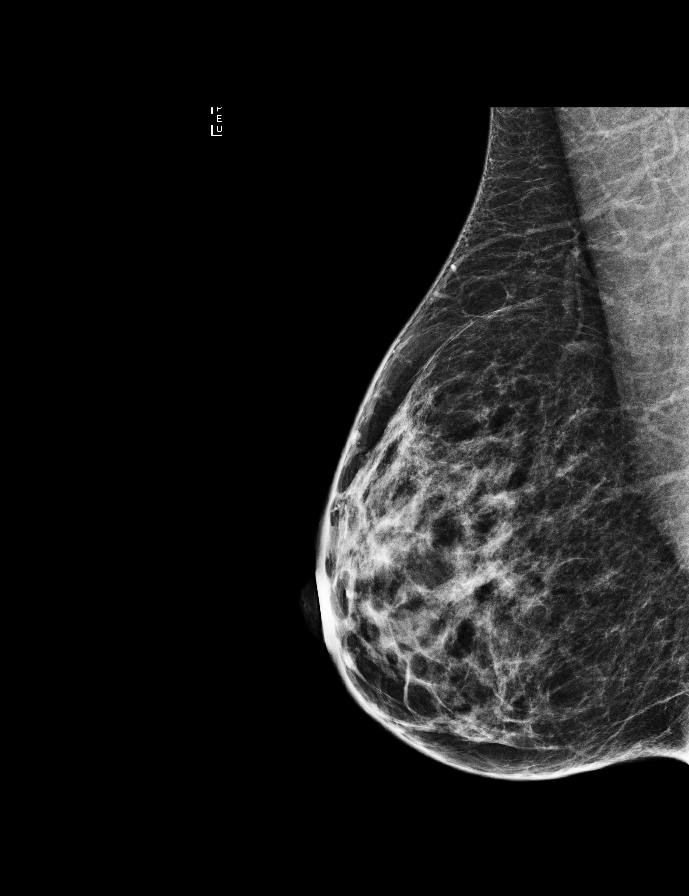

[L MLO]
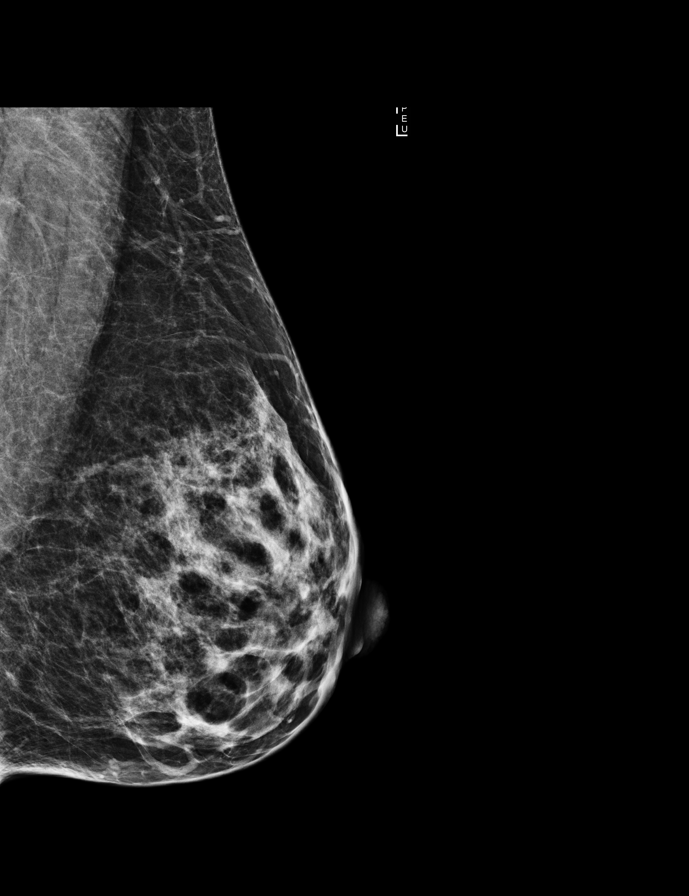

[L MLO synth-2D]
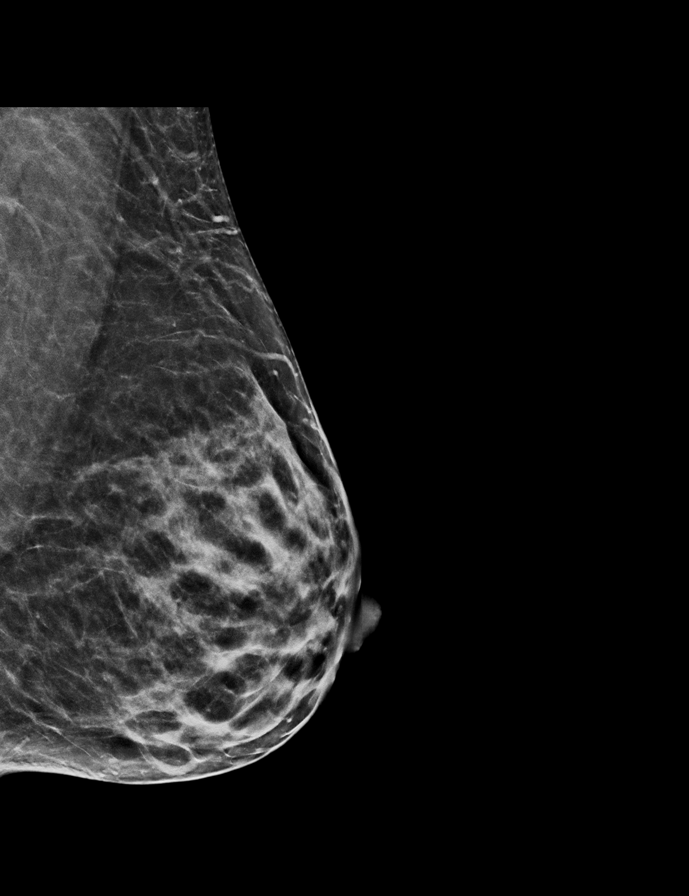

[L CC]
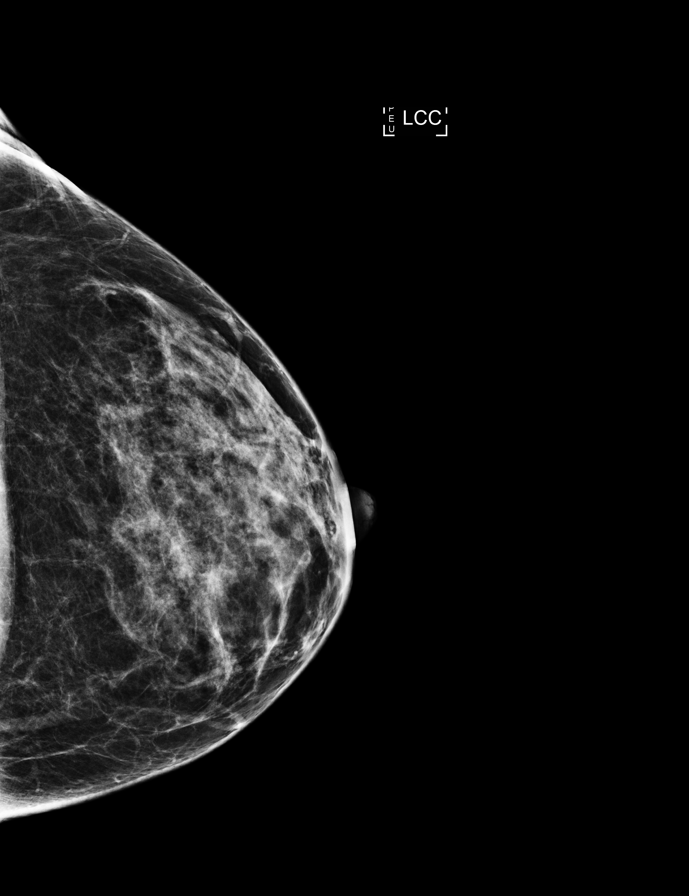

[L CC synth-2D]
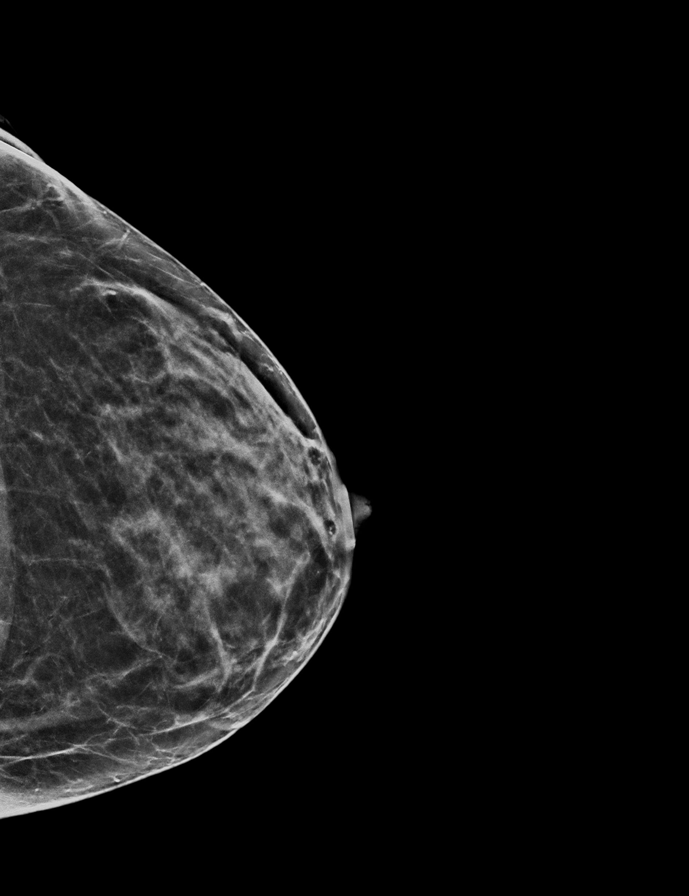

[L CC tomo · tomo slice 27/54.0]
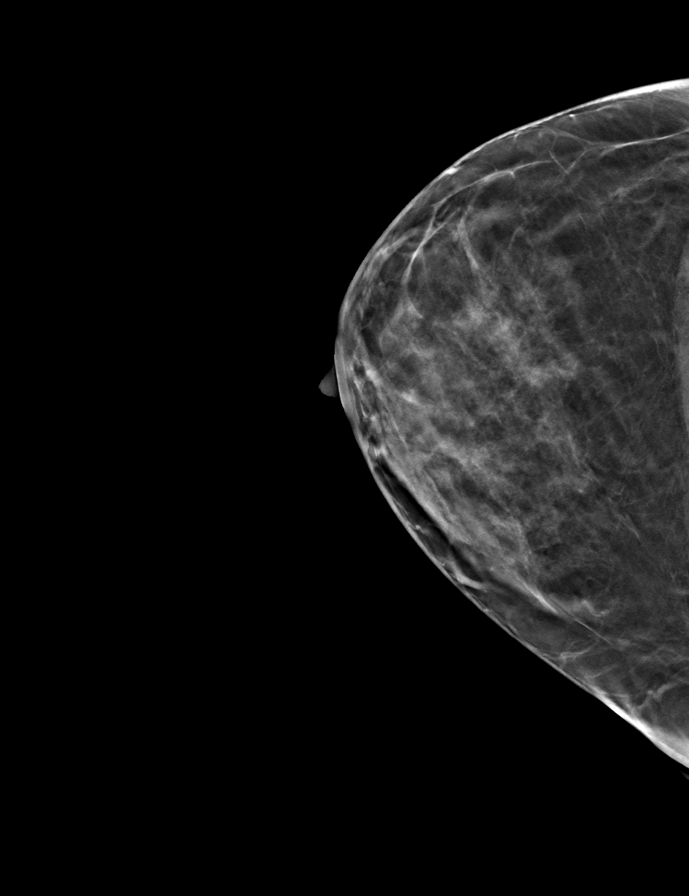

[9 of 28 positions shown; findings below may reference images not displayed]

ACR Breast Density Category c: The breast tissue is heterogeneously
dense, which may obscure small masses.
FINDINGS: There are no findings suspicious for malignancy. Images were
processed with CAD.
IMPRESSION: No mammographic evidence of malignancy. A result letter of this
screening mammogram will be mailed directly to the patient.

RECOMMENDATION:
Screening mammogram in one year. (Code:OA-G-1SS)

BI-RADS CATEGORY  1: Negative.

## 2015-01-30 ENCOUNTER — Telehealth: Payer: Self-pay | Admitting: Gynecology

## 2015-01-30 NOTE — Telephone Encounter (Signed)
On Call Note 6:40PM :  Placed tampon 3 pm and now unable to find it.  No pain, discharge, fever, chills.  Options for triage evaluation tonight vs office visit in AM if still unable to locate reviewed.  Patient prefers and feels comfortable with office visit in the AM.

## 2015-01-31 ENCOUNTER — Ambulatory Visit (INDEPENDENT_AMBULATORY_CARE_PROVIDER_SITE_OTHER): Payer: 59 | Admitting: Gynecology

## 2015-01-31 ENCOUNTER — Encounter: Payer: Self-pay | Admitting: Gynecology

## 2015-01-31 VITALS — BP 118/76

## 2015-01-31 DIAGNOSIS — T192XXA Foreign body in vulva and vagina, initial encounter: Secondary | ICD-10-CM

## 2015-01-31 NOTE — Progress Notes (Signed)
   Patient presented to the office today concerned about the possibility of a retained tampon. She is on her menstrual cycles and placed a tampon yesterday and could not find it at 3 PM yesterday. She is not certain if she had removed it her past on the toilet. She is otherwise asymptomatic. Patient denies any unusual vaginal discharge or odor. She denies any back pain or any abdominal pelvic pain. Patient with IUD for contraception.  Exam: Abdomen: Soft nontender no rebound or guarding Pelvic: Bartholin urethra Skene was within normal limits Vagina: Menstrual blood present no foreign objects or tampon noted in the vagina. Cervix: Menstrual blood present, IUD string seen, no gross lesions seen Bimanual exam: Uterus anteverted normal size shape and consistency no palpable foreign bodies or tampon noted Adnexa: No palpable masses or tenderness Rectal exam not done  Assessment/plan: Patient was given reassurance that there is no retained tampon she may go ahead and put in a new one today since she is on her menses. Patient due for annual exam at the end of this year.

## 2015-03-30 ENCOUNTER — Ambulatory Visit (INDEPENDENT_AMBULATORY_CARE_PROVIDER_SITE_OTHER): Payer: 59 | Admitting: Emergency Medicine

## 2015-03-30 VITALS — BP 106/80 | HR 80 | Temp 98.0°F | Resp 20 | Ht 65.5 in | Wt 170.6 lb

## 2015-03-30 DIAGNOSIS — H6983 Other specified disorders of Eustachian tube, bilateral: Secondary | ICD-10-CM

## 2015-03-30 MED ORDER — PSEUDOEPHEDRINE-GUAIFENESIN ER 60-600 MG PO TB12: 1.0000 | ORAL_TABLET | Freq: Two times a day (BID) | ORAL | Status: DC

## 2015-03-30 MED ORDER — TRIAMCINOLONE ACETONIDE 55 MCG/ACT NA AERO: 2.0000 | INHALATION_SPRAY | Freq: Every day | NASAL | Status: DC

## 2015-03-30 NOTE — Patient Instructions (Signed)
Barotitis Media Barotitis media is inflammation of your middle ear. This occurs when the auditory tube (eustachian tube) leading from the back of your nose (nasopharynx) to your eardrum is blocked. This blockage may result from a cold, environmental allergies, or an upper respiratory infection. Unresolved barotitis media may lead to damage or hearing loss (barotrauma), which may become permanent. HOME CARE INSTRUCTIONS   Use medicines as recommended by your health care provider. Over-the-counter medicines will help unblock the canal and can help during times of air travel.  Do not put anything into your ears to clean or unplug them. Eardrops will not be helpful.  Do not swim, dive, or fly until your health care provider says it is all right to do so. If these activities are necessary, chewing gum with frequent, forceful swallowing may help. It is also helpful to hold your nose and gently blow to pop your ears for equalizing pressure changes. This forces air into the eustachian tube.  Only take over-the-counter or prescription medicines for pain, discomfort, or fever as directed by your health care provider.  A decongestant may be helpful in decongesting the middle ear and make pressure equalization easier. SEEK MEDICAL CARE IF:  You experience a serious form of dizziness in which you feel as if the room is spinning and you feel nauseated (vertigo).  Your symptoms only involve one ear. SEEK IMMEDIATE MEDICAL CARE IF:   You develop a severe headache, dizziness, or severe ear pain.  You have bloody or pus-like drainage from your ears.  You develop a fever.  Your problems do not improve or become worse. MAKE SURE YOU:   Understand these instructions.  Will watch your condition.  Will get help right away if you are not doing well or get worse. Document Released: 11/22/2000 Document Revised: 09/15/2013 Document Reviewed: 06/22/2013 ExitCare Patient Information 2015 [employer]. This  information is not intended to replace advice given to you by your health care provider. Make sure you discuss any questions you have with your health care provider.  

## 2015-03-30 NOTE — Progress Notes (Signed)
[facility] [address] [place] [address] [phone]  Date:  03/30/2015   Name:  Patricia Erickson   DOB:  [birth year]   MRN:  [id number]  PCP:  Lottie Dawson, MD    Chief Complaint: Ear Fullness and Dizziness   History of Present Illness:  Patricia Erickson is a 44 y.o. very pleasant female patient who presents with the following:  Awoke this morning with pressure in ears as though [place]".   No antecedent illness or injury Says ears are muffled No fever or chills.  No cough or coryza Some dizziness today in paroxysms.  Not related to activity. No neuro or visual symptoms.  No slurred speech or facial asymmetry  No improvement with over the counter medications or other home remedies.  Denies other complaint or health concern today.   Patient Active Problem List   Diagnosis Date Noted  . Loss of weight 11/02/2013  . Family history of colon cancer 10/12/2012  . Family history of breast cancer in female 10/12/2012  . Migraines   . MIGRAINE VARIANT, INTRACTABLE 01/19/2009  . MIGRAINE HEADACHE 01/19/2009  . COLONIC POLYPS, BENIGN 01/10/2009  . HEMORRHOIDS 01/10/2009  . HIP PAIN, LEFT 01/10/2009  . INSOMNIA 01/10/2009  . COLONIC POLYPS, HX OF 01/10/2009    Past Medical History  Diagnosis Date  . Polyp of colon 03/11/2007    BENIGN  . Anxiety   . Migraines     Past Surgical History  Procedure Laterality Date  . Cesarean section  2000    History  Substance Use Topics  . Smoking status: Former Smoker    Quit date: 10/12/1992  . Smokeless tobacco: Never Used  . Alcohol Use: 0.0 oz/week    0 Standard drinks or equivalent per week     Comment: OCCASIONALLY    Family History  Problem Relation Age of Onset  . Cancer Mother     COLON  . Osteoporosis Mother   . Breast cancer Mother 12  . Hypertension Father   . Osteoporosis Maternal Grandmother     Allergies  Allergen Reactions  . Propoxyphene N-Acetaminophen      Medication list has been reviewed and updated.  Current Outpatient Prescriptions on File Prior to Visit  Medication Sig Dispense Refill  . chlorproMAZINE (THORAZINE) 25 MG tablet Take 25 mg by mouth as needed.     . cyclobenzaprine (FLEXERIL) 10 MG tablet Take 10 mg by mouth 3 (three) times daily as needed.      . gabapentin (NEURONTIN) 100 MG capsule Take 100 mg by mouth 3 (three) times daily.      Marland Kitchen levonorgestrel (MIRENA) 20 MCG/24HR IUD 1 each by Intrauterine route once. INSERTED 05/06/2007     . topiramate (TOPAMAX) 100 MG tablet Take 150 mg by mouth daily.     Marland Kitchen azithromycin (ZITHROMAX) 250 MG tablet Take 1 tablet (250 mg total) by mouth 2 (two) times daily. (Patient not taking: Reported on 01/31/2015) 14 tablet 0  . cefUROXime (CEFTIN) 500 MG tablet Take 1 tablet (500 mg total) by mouth 2 (two) times daily. (Patient not taking: Reported on 11/09/2014) 14 tablet 0  . doxycycline (VIBRAMYCIN) 100 MG capsule Take 1 capsule (100 mg total) by mouth 2 (two) times daily. (Patient not taking: Reported on 11/09/2014) 14 capsule 0  . estradiol (ESTRACE) 1 MG tablet Take 1 tablet (1 mg total) by mouth daily. (Patient not taking: Reported on 03/30/2015) 30 tablet 0  .  nabumetone (RELAFEN) 750 MG tablet Take 750 mg by mouth daily.     No current facility-administered medications on file prior to visit.    Review of Systems:  As per HPI, otherwise negative.    Physical Examination: There were no vitals filed for this visit. There were no vitals filed for this visit. There is no weight on file to calculate BMI. Ideal Body Weight:    GEN: WDWN, NAD, Non-toxic, A & O x 3 HEENT: Atraumatic, Normocephalic. Neck supple. No masses, No LAD. Ears and Nose: No external deformity.  Unable to valsalva CV: RRR, No M/G/R. No JVD. No thrill. No extra heart sounds. PULM: CTA B, no wheezes, crackles, rhonchi. No retractions. No resp. distress. No accessory muscle use. ABD: S, NT, ND, +BS. No rebound. No  HSM. EXTR: No c/c/e NEURO Normal gait.  Romberg and tandem gait intact.  PRRERLA EOMI CN 2-12 intact PSYCH: Normally interactive. Conversant. Not depressed or anxious appearing.  Calm demeanor.    Assessment and Plan: Eustachian tube dysfunction mucinex d nasacort  Signed,  Ellison Carwin, MD

## 2015-04-21 ENCOUNTER — Inpatient Hospital Stay (HOSPITAL_COMMUNITY)
Admission: EM | Admit: 2015-04-21 | Discharge: 2015-04-23 | DRG: 603 | Disposition: A | Discharge status: Home / Self Care | Payer: 59 | Attending: Internal Medicine | Admitting: Internal Medicine

## 2015-04-21 ENCOUNTER — Encounter (HOSPITAL_COMMUNITY): Payer: Self-pay | Admitting: Family Medicine

## 2015-04-21 ENCOUNTER — Ambulatory Visit (INDEPENDENT_AMBULATORY_CARE_PROVIDER_SITE_OTHER): Payer: 59 | Admitting: Emergency Medicine

## 2015-04-21 ENCOUNTER — Emergency Department (HOSPITAL_COMMUNITY): Payer: 59

## 2015-04-21 VITALS — BP 132/78 | HR 99 | Temp 98.9°F | Resp 16 | Ht 65.5 in | Wt 170.0 lb

## 2015-04-21 DIAGNOSIS — W540XXD Bitten by dog, subsequent encounter: Secondary | ICD-10-CM

## 2015-04-21 DIAGNOSIS — L03115 Cellulitis of right lower limb: Secondary | ICD-10-CM

## 2015-04-21 DIAGNOSIS — Z86010 Personal history of colonic polyps: Secondary | ICD-10-CM

## 2015-04-21 DIAGNOSIS — Z87891 Personal history of nicotine dependence: Secondary | ICD-10-CM

## 2015-04-21 DIAGNOSIS — G43909 Migraine, unspecified, not intractable, without status migrainosus: Secondary | ICD-10-CM | POA: Diagnosis present

## 2015-04-21 DIAGNOSIS — T402X5A Adverse effect of other opioids, initial encounter: Secondary | ICD-10-CM | POA: Diagnosis present

## 2015-04-21 DIAGNOSIS — W540XXA Bitten by dog, initial encounter: Secondary | ICD-10-CM | POA: Diagnosis present

## 2015-04-21 DIAGNOSIS — F419 Anxiety disorder, unspecified: Secondary | ICD-10-CM | POA: Diagnosis present

## 2015-04-21 DIAGNOSIS — Z888 Allergy status to other drugs, medicaments and biological substances status: Secondary | ICD-10-CM | POA: Diagnosis not present

## 2015-04-21 DIAGNOSIS — S81851A Open bite, right lower leg, initial encounter: Secondary | ICD-10-CM | POA: Diagnosis not present

## 2015-04-21 DIAGNOSIS — T798XXA Other early complications of trauma, initial encounter: Secondary | ICD-10-CM

## 2015-04-21 DIAGNOSIS — L089 Local infection of the skin and subcutaneous tissue, unspecified: Secondary | ICD-10-CM | POA: Diagnosis present

## 2015-04-21 DIAGNOSIS — S81851D Open bite, right lower leg, subsequent encounter: Secondary | ICD-10-CM

## 2015-04-21 DIAGNOSIS — B9689 Other specified bacterial agents as the cause of diseases classified elsewhere: Secondary | ICD-10-CM | POA: Diagnosis not present

## 2015-04-21 DIAGNOSIS — L299 Pruritus, unspecified: Secondary | ICD-10-CM | POA: Diagnosis present

## 2015-04-21 LAB — CBC WITH DIFFERENTIAL/PLATELET
Basophils Absolute: 0 10*3/uL (ref 0.0–0.1)
Basophils Relative: 0 % (ref 0–1)
Eosinophils Absolute: 0.2 10*3/uL (ref 0.0–0.7)
Eosinophils Relative: 2 % (ref 0–5)
HCT: 37.6 % (ref 36.0–46.0)
Hemoglobin: 12.7 g/dL (ref 12.0–15.0)
Lymphocytes Relative: 23 % (ref 12–46)
Lymphs Abs: 1.7 10*3/uL (ref 0.7–4.0)
MCH: 30.9 pg (ref 26.0–34.0)
MCHC: 33.8 g/dL (ref 30.0–36.0)
MCV: 91.5 fL (ref 78.0–100.0)
Monocytes Absolute: 0.7 10*3/uL (ref 0.1–1.0)
Monocytes Relative: 9 % (ref 3–12)
Neutro Abs: 4.8 10*3/uL (ref 1.7–7.7)
Neutrophils Relative %: 65 % (ref 43–77)
Platelets: 305 10*3/uL (ref 150–400)
RBC: 4.11 MIL/uL (ref 3.87–5.11)
RDW: 12.6 % (ref 11.5–15.5)
WBC: 7.3 10*3/uL (ref 4.0–10.5)

## 2015-04-21 LAB — POCT CBC
Granulocyte percent: 69.7 %G (ref 37–80)
HCT, POC: 39.3 % (ref 37.7–47.9)
Hemoglobin: 13 g/dL (ref 12.2–16.2)
Lymph, poc: 1.6 (ref 0.6–3.4)
MCH, POC: 30.5 pg (ref 27–31.2)
MCHC: 33.1 g/dL (ref 31.8–35.4)
MCV: 92 fL (ref 80–97)
MID (cbc): 0.5 (ref 0–0.9)
MPV: 7.1 fL (ref 0–99.8)
POC Granulocyte: 4.7 (ref 2–6.9)
POC LYMPH PERCENT: 22.8 %L (ref 10–50)
POC MID %: 7.5 %M (ref 0–12)
Platelet Count, POC: 355 10*3/uL (ref 142–424)
RBC: 4.27 M/uL (ref 4.04–5.48)
RDW, POC: 13 %
WBC: 6.8 10*3/uL (ref 4.6–10.2)

## 2015-04-21 LAB — BASIC METABOLIC PANEL
Anion gap: 9 (ref 5–15)
BUN: 16 mg/dL (ref 6–20)
CO2: 22 mmol/L (ref 22–32)
Calcium: 9.1 mg/dL (ref 8.9–10.3)
Chloride: 111 mmol/L (ref 101–111)
Creatinine, Ser: 0.77 mg/dL (ref 0.44–1.00)
GFR calc Af Amer: >60 mL/min (ref >60)
GFR calc non Af Amer: >60 mL/min (ref >60)
Glucose, Bld: 107 mg/dL — ABNORMAL HIGH (ref 65–99)
Potassium: 3.7 mmol/L (ref 3.5–5.1)
Sodium: 142 mmol/L (ref 135–145)

## 2015-04-21 LAB — I-STAT CG4 LACTIC ACID, ED: Lactic Acid, Venous: 1.11 mmol/L (ref 0.5–2.0)

## 2015-04-21 MED ORDER — MORPHINE SULFATE 4 MG/ML IJ SOLN
4.0000 mg | Freq: Once | INTRAMUSCULAR | Status: AC
  Administered 2015-04-21: 4 mg via INTRAVENOUS
  Filled 2015-04-21: qty 1

## 2015-04-21 MED ORDER — CHLORPROMAZINE HCL 25 MG PO TABS
25.0000 mg | ORAL_TABLET | Freq: Three times a day (TID) | ORAL | Status: DC | PRN
  Filled 2015-04-21: qty 1

## 2015-04-21 MED ORDER — ONDANSETRON HCL 4 MG/2ML IJ SOLN: 4.0000 mg | Freq: Four times a day (QID) | INTRAMUSCULAR | Status: DC | PRN

## 2015-04-21 MED ORDER — MORPHINE SULFATE 4 MG/ML IJ SOLN
4.0000 mg | Freq: Once | INTRAMUSCULAR | Status: AC
Start: 2015-04-21 — End: 2015-04-21
  Administered 2015-04-21: 4 mg via INTRAVENOUS
  Filled 2015-04-21: qty 1

## 2015-04-21 MED ORDER — MORPHINE SULFATE 2 MG/ML IJ SOLN
1.0000 mg | INTRAMUSCULAR | Status: DC | PRN
  Administered 2015-04-21: 1 mg via INTRAVENOUS
  Filled 2015-04-21: qty 1

## 2015-04-21 MED ORDER — TOPIRAMATE 100 MG PO TABS
200.0000 mg | ORAL_TABLET | Freq: Every day | ORAL | Status: DC
  Administered 2015-04-21 – 2015-04-22 (×2): 200 mg via ORAL
  Filled 2015-04-21 (×2): qty 2

## 2015-04-21 MED ORDER — VANCOMYCIN HCL IN DEXTROSE 1-5 GM/200ML-% IV SOLN
1000.0000 mg | Freq: Once | INTRAVENOUS | Status: AC
  Administered 2015-04-21: 1000 mg via INTRAVENOUS
  Filled 2015-04-21: qty 200

## 2015-04-21 MED ORDER — ONDANSETRON HCL 4 MG PO TABS: 4.0000 mg | ORAL_TABLET | Freq: Four times a day (QID) | ORAL | Status: DC | PRN

## 2015-04-21 MED ORDER — PIPERACILLIN-TAZOBACTAM 3.375 G IVPB
3.3750 g | Freq: Three times a day (TID) | INTRAVENOUS | Status: DC
  Administered 2015-04-22 – 2015-04-23 (×4): 3.375 g via INTRAVENOUS
  Filled 2015-04-21 (×6): qty 50

## 2015-04-21 MED ORDER — ENOXAPARIN SODIUM 40 MG/0.4ML ~~LOC~~ SOLN: 40.0000 mg | Freq: Every day | SUBCUTANEOUS | Status: DC

## 2015-04-21 MED ORDER — GABAPENTIN 300 MG PO CAPS
300.0000 mg | ORAL_CAPSULE | Freq: Every day | ORAL | Status: DC
  Administered 2015-04-21 – 2015-04-22 (×2): 300 mg via ORAL
  Filled 2015-04-21 (×3): qty 1

## 2015-04-21 MED ORDER — PIPERACILLIN-TAZOBACTAM 3.375 G IVPB 30 MIN
3.3750 g | Freq: Once | INTRAVENOUS | Status: AC
  Administered 2015-04-21: 3.375 g via INTRAVENOUS
  Filled 2015-04-21: qty 50

## 2015-04-21 MED ORDER — ONDANSETRON HCL 4 MG/2ML IJ SOLN: 4.0000 mg | Freq: Three times a day (TID) | INTRAMUSCULAR | Status: AC | PRN

## 2015-04-21 MED ORDER — VANCOMYCIN HCL IN DEXTROSE 750-5 MG/150ML-% IV SOLN
750.0000 mg | Freq: Three times a day (TID) | INTRAVENOUS | Status: DC
  Administered 2015-04-22 – 2015-04-23 (×4): 750 mg via INTRAVENOUS
  Filled 2015-04-21 (×6): qty 150

## 2015-04-21 MED ORDER — HYDROMORPHONE HCL 1 MG/ML IJ SOLN: 1.0000 mg | INTRAMUSCULAR | Status: DC | PRN

## 2015-04-21 MED ORDER — NABUMETONE 750 MG PO TABS
750.0000 mg | ORAL_TABLET | Freq: Every day | ORAL | Status: DC | PRN
  Filled 2015-04-21: qty 1

## 2015-04-21 NOTE — ED Notes (Signed)
Pt here for infection to RLE from dog bite. Sent here by doctor fr IV abx

## 2015-04-21 NOTE — ED Notes (Signed)
Patient transported to CT 

## 2015-04-21 NOTE — Progress Notes (Signed)
ANTIBIOTIC CONSULT NOTE - INITIAL  Pharmacy Consult for Vancomycin/Zosyn  Indication: Wound infection  Allergies  Allergen Reactions  . Propoxyphene N-Acetaminophen Nausea And Vomiting    Patient Measurements: ~77 kg  Vital Signs: Temp: 98.2 F (36.8 C) (05/13 2258) Temp Source: Oral (05/13 2258) BP: 118/70 mmHg (05/13 2258) Pulse Rate: 82 (05/13 2258)  Labs:  Recent Labs  04/21/15 1558 04/21/15 1938  WBC 6.8 7.3  HGB 13.0 12.7  PLT  --  305  CREATININE  --  0.77   Estimated Creatinine Clearance: 91.3 mL/min (by C-G formula based on Cr of 0.77).  Assessment: 44 y/o F s/p dog bite with stitches, on augmentin at home, admitted with worsening cellulitis/wound infection. WBC WNL. Renal function good. Other labs as above.   Goal of Therapy:  Vancomycin trough level 15-20 mcg/ml  Plan:  -Vancomycin 750 mg IV q8h -Zosyn 3.375G IV q8h to be infused over 4 hours -Trend WBC, temp, renal function  -Drug levels as indicated   Narda Bonds 04/21/2015,11:05 PM

## 2015-04-21 NOTE — ED Notes (Signed)
Admitting MDs at the bedside.

## 2015-04-21 NOTE — ED Provider Notes (Addendum)
CSN: [id number]     Arrival date & time 04/21/15  1705 History   First MD Initiated Contact with Patient 04/21/15 1854     Chief Complaint  Patient presents with  . Wound Infection     (Consider location/radiation/quality/duration/timing/severity/associated sxs/prior Treatment) The history is provided by the patient.   44 year old female was bitten by a dog 5 days ago. The bite was on her right leg. She received stitches. She has had pain and swelling virtually from the beginning. She was placed on amoxicillin-clavulanic acid for wound prophylaxis. Pain has become worse in the leg has become more swollen. She has run low-grade fevers at home up to 99.2. There've been no chills or sweats. She went to urgent care today and was referred to the ED for possible admission for wound infection/cellulitis. She has been taking oxycodone-acetaminophen for pain but only gets slight relief. She rates her pain at 7/10.  Past Medical History  Diagnosis Date  . Polyp of colon 03/11/2007    BENIGN  . Anxiety   . Migraines    Past Surgical History  Procedure Laterality Date  . Cesarean section  2000   Family History  Problem Relation Age of Onset  . Cancer Mother     COLON  . Osteoporosis Mother   . Breast cancer Mother 39  . Hypertension Father   . Osteoporosis Maternal Grandmother    History  Substance Use Topics  . Smoking status: Former Smoker    Quit date: 10/12/1992  . Smokeless tobacco: Never Used  . Alcohol Use: 0.0 oz/week    0 Standard drinks or equivalent per week     Comment: OCCASIONALLY   OB History    Gravida Para Term Preterm AB TAB SAB Ectopic Multiple Living   2 1 1  1  1   1      Review of Systems  All other systems reviewed and are negative.     Allergies  Propoxyphene n-acetaminophen  Home Medications   Prior to Admission medications   Medication Sig Start Date End Date Taking? Authorizing Provider  amoxicillin-clavulanate (AUGMENTIN) 500-125 MG per  tablet Take 1 tablet by mouth 2 (two) times daily.    Historical Provider, MD  chlorproMAZINE (THORAZINE) 25 MG tablet Take 25 mg by mouth as needed.     Historical Provider, MD  gabapentin (NEURONTIN) 100 MG capsule Take 100 mg by mouth 3 (three) times daily.      Historical Provider, MD  levonorgestrel (MIRENA) 20 MCG/24HR IUD 1 each by Intrauterine route once. INSERTED 05/06/2007     Historical Provider, MD  nabumetone (RELAFEN) 750 MG tablet Take 750 mg by mouth daily.    Historical Provider, MD  oxyCODONE-acetaminophen (PERCOCET/ROXICET) 5-325 MG per tablet Take by mouth every 4 (four) hours as needed for severe pain.    Historical Provider, MD  topiramate (TOPAMAX) 100 MG tablet Take 150 mg by mouth daily.     Historical Provider, MD   BP 125/64 mmHg  Pulse 79  Temp(Src) 98.8 F (37.1 C) (Oral)  Resp 17  SpO2 100% Physical Exam  Nursing note and vitals reviewed.  44 year old female, resting comfortably and in no acute distress. Vital signs are normal. Oxygen saturation is 100%, which is normal. Head is normocephalic and atraumatic. PERRLA, EOMI. Oropharynx is clear. Neck is nontender and supple without adenopathy or JVD. Back is nontender and there is no CVA tenderness. Lungs are clear without rales, wheezes, or rhonchi. Chest is nontender. Heart has regular  rate and rhythm without murmur. Abdomen is soft, flat, nontender without masses or hepatosplenomegaly and peristalsis is normoactive. Extremities: Right lower leg is generally swollen. Several lacerations are present with sutures present. One laceration has slight serous drainage but no drainage. Distal pulses are present and capillary refill is prompt. Skin is warm and dry without rash. Neurologic: Mental status is normal, cranial nerves are intact, there are no motor or sensory deficits.  ED Course  Procedures (including critical care time) SUTURE REMOVAL Performed by: STMHD,[email or web address]  Consent: Verbal consent  obtained. Patient identity confirmed: provided demographic data Time out: Immediately prior to procedure a "time out" was called to verify the correct patient, procedure, equipment, support staff and site/side marked as required.  Location details: Right lower leg  Wound Appearance: Generalized erythema without drainage. 1 other wounds did appear to have slight dehiscence and culture was sent.   Sutures Removed: 11  Facility: sutures placed in the ED at [facility]  Patient tolerance: Patient tolerated the procedure well with no immediate complications.    Labs Review Results for orders placed or performed during the hospital encounter of 79/89/21  Basic metabolic panel  Result Value Ref Range   Sodium 142 135 - 145 mmol/L   Potassium 3.7 3.5 - 5.1 mmol/L   Chloride 111 101 - 111 mmol/L   CO2 22 22 - 32 mmol/L   Glucose, Bld 107 (H) 65 - 99 mg/dL   BUN 16 6 - 20 mg/dL   Creatinine, Ser 0.77 0.44 - 1.00 mg/dL   Calcium 9.1 8.9 - 10.3 mg/dL   GFR calc non Af Amer >60 >60 mL/min   GFR calc Af Amer >60 >60 mL/min   Anion gap 9 5 - 15  CBC with Differential  Result Value Ref Range   WBC 7.3 4.0 - 10.5 K/uL   RBC 4.11 3.87 - 5.11 MIL/uL   Hemoglobin 12.7 12.0 - 15.0 g/dL   HCT 37.6 36.0 - 46.0 %   MCV 91.5 78.0 - 100.0 fL   MCH 30.9 26.0 - 34.0 pg   MCHC 33.8 30.0 - 36.0 g/dL   RDW 12.6 11.5 - 15.5 %   Platelets 305 150 - 400 K/uL   Neutrophils Relative % 65 43 - 77 %   Neutro Abs 4.8 1.7 - 7.7 K/uL   Lymphocytes Relative 23 12 - 46 %   Lymphs Abs 1.7 0.7 - 4.0 K/uL   Monocytes Relative 9 3 - 12 %   Monocytes Absolute 0.7 0.1 - 1.0 K/uL   Eosinophils Relative 2 0 - 5 %   Eosinophils Absolute 0.2 0.0 - 0.7 K/uL   Basophils Relative 0 0 - 1 %   Basophils Absolute 0.0 0.0 - 0.1 K/uL  I-Stat CG4 Lactic Acid, ED  Result Value Ref Range   Lactic Acid, Venous 1.11 0.5 - 2.0 mmol/L    Imaging Review Dg Tibia/fibula Right  04/21/2015   CLINICAL DATA:  Right  lower extremity infection after dog bite.  EXAM: RIGHT TIBIA AND FIBULA - 2 VIEW  COMPARISON:  None.  FINDINGS: There is no evidence of fracture or other focal bone lesions. Soft tissues are unremarkable. No radiopaque foreign body is noted.  IMPRESSION: Normal right tibia and fibula.   Electronically Signed   By: Marijo Conception, M.D.   On: 04/21/2015 20:30     MDM   Final diagnoses:  Animal bite of lower leg, right, subsequent encounter  Wound infection, posttraumatic, initial encounter  Cellulitis  of right lower leg    Infection and right lower leg status post dog bite. This has failed outpatient management. Patient does not appear toxic, and, on review of old records, WBC at urgent care center was 6.8 earlier today. Sutures are removed and she is given morphine for pain. She is given vancomycin and Zosyn for infection. X-ray be obtained to look for foreign body or possible subcutaneous air. Clinically, she does not have appearance of necrotizing fasciitis. I anticipate need for hospitalization.  Laboratory workup is reassuring and x-rays are unremarkable. Case is discussed with Dr. Raelene Bott of internal medicine teaching service who agrees to admit the patient. Case has also been discussed with Dr. Ninfa Linden of orthopedic service who will also evaluate the patient.  Delora Fuel, MD [id number]  Delora Fuel, MD 64/38/38 [phone]

## 2015-04-21 NOTE — Patient Instructions (Addendum)
Please go to [facility] for evaluation and treatment.

## 2015-04-21 NOTE — ED Notes (Signed)
Dr. Blackmon at bedside. 

## 2015-04-21 NOTE — H&P (Signed)
Date: 04/21/2015               Patient Name:  Patricia Erickson MRN: [id number]  DOB: [birth year] Age / Sex: 44 y.o., female   PCP: No Pcp Per Patient         Medical Service: Internal Medicine Teaching Service         Attending Physician: Dr. Bartholomew Crews, MD    First Contact: Dr. Arcelia Jew Pager: [phone]  Second Contact: Dr. Heber [place] Pager: [phone]       After Hours (After 5p/  First Contact Pager: [phone]  weekends / holidays): Second Contact Pager: [phone]   Chief Complaint: Wound Infection  History of Present Illness:   Patient is a 44 year old female with a history of migraines who presents to the emergency department 5 days after a dog bite. She states that she was involved in an altercation between her own dog and another dog and was bitten by her own dog. She states that there was profuse bleeding and swelling in her leg began shortly after the incident. She was evaluated initially at an urgent care soon afterwards and received stitches as well as a course of Augmentin as well as Percocet for pain. She states that over the last 5 days, the pain, erythema, and swelling have gotten progressively worse. She states that she has not been taking the Percocet regularly since she has been continuing to work. However, she has been compliant with her Augmentin therapy no major side effects. She was seen again in urgent care the day of presentation and was sent to the emergency department because of concern for increasing infection and possibility of underlying abscess.  Otherwise, patient denies any fevers, chills, chest pain, dyspnea, abdominal pain, dysuria, hematuria, constipation, or diarrhea. In the emergency department, patient received vancomycin, Zosyn and morphine with successful alleviation of her pain. Her stitches were removed.  Meds:  (Not in a hospital admission) Current Facility-Administered Medications  Medication Dose Route Frequency Provider Last Rate Last Dose  .  HYDROmorphone (DILAUDID) injection 1 mg  1 mg Intravenous L9J PRN Delora Fuel, MD      . ondansetron [facility]) injection 4 mg  4 mg Intravenous Q7H PRN Delora Fuel, MD       Current Outpatient Prescriptions  Medication Sig Dispense Refill  . gabapentin (NEURONTIN) 100 MG capsule Take 300 mg by mouth at bedtime.     Marland Kitchen levonorgestrel (MIRENA) 20 MCG/24HR IUD 1 each by Intrauterine route once. INSERTED 05/06/2007     . oxyCODONE-acetaminophen (PERCOCET/ROXICET) 5-325 MG per tablet Take by mouth every 4 (four) hours as needed for severe pain.    Marland Kitchen topiramate (TOPAMAX) 200 MG tablet Take 200 mg by mouth at bedtime.    Marland Kitchen amoxicillin-clavulanate (AUGMENTIN) 875-125 MG per tablet Take 1 tablet by mouth 2 (two) times daily.    . chlorproMAZINE (THORAZINE) 25 MG tablet Take 25 mg by mouth as needed (for migraines).     . nabumetone (RELAFEN) 750 MG tablet Take 750 mg by mouth daily as needed (for migraines).       Past Medical History  Diagnosis Date  . Polyp of colon 03/11/2007    BENIGN  . Anxiety   . Migraines     Past Surgical History  Procedure Laterality Date  . Cesarean section  2000     Allergies: Allergies as of 04/21/2015 - Review Complete 04/21/2015  Allergen Reaction Noted  . Propoxyphene n-acetaminophen Nausea And Vomiting     Family History  Problem Relation Age of Onset  . Cancer Mother     COLON  . Osteoporosis Mother   . Breast cancer Mother 82  . Hypertension Father   . Osteoporosis Maternal Grandmother     History   Social History  . Marital Status: Divorced    Spouse Name: N/A  . Number of Children: N/A  . Years of Education: N/A   Occupational History  . Not on file.   Social History Main Topics  . Smoking status: Former Smoker    Quit date: 10/12/1992  . Smokeless tobacco: Never Used  . Alcohol Use: 0.0 oz/week    0 Standard drinks or equivalent per week     Comment: OCCASIONALLY  . Drug Use: No  . Sexual Activity: Yes    Birth Control/  Protection: IUD     Comment: Mirena 10/29/2012   Other Topics Concern  . Not on file   Social History Narrative     Review of Systems: All pertinent ROS as stated in HPI.   Physical Exam: Blood pressure 109/69, pulse 81, temperature 98.8 F (37.1 C), temperature source Oral, resp. rate 20, SpO2 99 %. General: resting in bed HEENT: PERRL, EOMI, no scleral icterus Cardiac: RRR, no rubs, murmurs or gallops Pulm: clear to auscultation bilaterally, moving normal volumes of air Abd: soft, nontender, nondistended, BS present Ext: Left extremity warm and well perfused with intact pulse, right lower extremity with multiple linear lacerations on the lateral and medial aspects of the lower extremity with surrounding erythema, moderate tenderness to palpation, warmth, no significant fluctuance, no crepitus, dorsalis pedis pulse 2+, serosanguineous drainage        Neuro: alert and oriented X3, cranial nerves II-XII grossly intact Skin: Other than lacerations or lesions noted above, no rashes or lesions noted Psych: appropriate affect and cognition  Lab results: Basic Metabolic Panel:  Recent Labs  04/21/15 1938  NA 142  K 3.7  CL 111  CO2 22  GLUCOSE 107*  BUN 16  CREATININE 0.77  CALCIUM 9.1   Liver Function Tests: No results for input(s): AST, ALT, ALKPHOS, BILITOT, PROT, ALBUMIN in the last 72 hours. No results for input(s): LIPASE, AMYLASE in the last 72 hours. No results for input(s): AMMONIA in the last 72 hours. CBC:  Recent Labs  04/21/15 1558 04/21/15 1938  WBC 6.8 7.3  NEUTROABS  --  4.8  HGB 13.0 12.7  HCT 39.3 37.6  MCV 92.0 91.5  PLT  --  305   Cardiac Enzymes: No results for input(s): CKTOTAL, CKMB, CKMBINDEX, TROPONINI in the last 72 hours. BNP: No results for input(s): PROBNP in the last 72 hours. D-Dimer: No results for input(s): DDIMER in the last 72 hours. CBG: No results for input(s): GLUCAP in the last 72 hours. Hemoglobin A1C: No  results for input(s): HGBA1C in the last 72 hours. Fasting Lipid Panel: No results for input(s): CHOL, HDL, LDLCALC, TRIG, CHOLHDL, LDLDIRECT in the last 72 hours. Thyroid Function Tests: No results for input(s): TSH, T4TOTAL, FREET4, T3FREE, THYROIDAB in the last 72 hours. Anemia Panel: No results for input(s): VITAMINB12, FOLATE, FERRITIN, TIBC, IRON, RETICCTPCT in the last 72 hours. Coagulation: No results for input(s): LABPROT, INR in the last 72 hours. Urine Drug Screen: Drugs of Abuse  No results found for: LABOPIA, COCAINSCRNUR, LABBENZ, AMPHETMU, THCU, LABBARB  Alcohol Level: No results for input(s): ETH in the last 72 hours. Urinalysis: No results for input(s): COLORURINE, LABSPEC, [place], [place], [place], [place], [place], [place], [place], NITRITE, LEUKOCYTESUR  in the last 72 hours.  Invalid input(s): APPERANCEUR  Imaging results:  Dg Tibia/fibula Right  04/21/2015   CLINICAL DATA:  Right lower extremity infection after dog bite.  EXAM: RIGHT TIBIA AND FIBULA - 2 VIEW  COMPARISON:  None.  FINDINGS: There is no evidence of fracture or other focal bone lesions. Soft tissues are unremarkable. No radiopaque foreign body is noted.  IMPRESSION: Normal right tibia and fibula.   Electronically Signed   By: Marijo Conception, M.D.   On: 04/21/2015 20:30    Assessment & Plan by Problem: Active Problems:   Cellulitis of right lower leg  Patient is a 44 year old female with a history of migraines who is admitted for right lower extremity cellulitis secondary to dog bite.  Right lower extremity cellulitis secondary to dog bite: Persistent cellulitis that has failed treatment with Augmentin. There are no complicating signs or symptoms of systemic infection or deep soft tissue infection. No evidence of foreign body or subcutaneous air on plain film. Difficult to rule out underlying abscess clinically in the setting of treatment failure and significant swelling in the extremity.  Vancomycin and Zosyn started in the emergency department. Patient does not believe that there is any way she could be pregnant as she has an IUD. -Continue with vancomycin and Zosyn -Orthopedic surgery to evaluate -MRI to evaluate for underlying abscess -Morphine 1 mg every 3 hours when necessary  Migraines: No acute issues. At home, patient is on gabapentin 300 mg daily at bedtime and topiramate 200 mg daily at bedtime. Additionally, nabumetone and chlorpromazine PRN for severe migraines. -Continue home regimen.  Diet: Regular Prophylaxis: Lovenox Code: Full  Dispo: Disposition is deferred at this time, awaiting improvement of current medical problems. Anticipated discharge in approximately 3 day(s).   The patient does not have a current PCP (No Pcp Per Patient) and does need an [facility] hospital follow-up appointment after discharge.  The patient does not have transportation limitations that hinder transportation to clinic appointments.  Signed: Luan Moore, M.D., Ph.D. Internal Medicine Teaching Service, PGY-1 04/21/2015, 10:39 PM

## 2015-04-21 NOTE — ED Notes (Signed)
Pt states was bitten by her dog on Mother's Day because they were being attacked by another dog.  Has been on Augmentin BID since.

## 2015-04-21 NOTE — Progress Notes (Addendum)
Subjective:    Patient ID: Patricia Erickson, female    DOB: Apr 30, 1971, 44 y.o.   MRN: [id number] This chart was scribed for Arlyss Queen, MD by Marti Sleigh, Medical Scribe. This patient was seen in Room 4 and the patient's care was started at 3:43 PM.  Chief Complaint  Patient presents with  . Follow-up    Dog bite on right leg    HPI HPI Comments: Patricia Erickson is a 44 y.o. female who presents to [facility] complaining of a wound from a dog bite that occurred five days ago. The dog was her dog, and has all its shots. She went to [facility] where her wound was cleaned and sutured. Over the last 24 hours she has had progressive increased pain in the right leg. Her boyfriend noticed an area of discolored skin on the right leg last night. Today she has had increased pain, and has been unable to bear weight with leg. She has felt extremely weak and tired, without fever or chills.    Review of Systems  Constitutional: Negative for fever and chills.  Skin: Positive for color change and wound.  Neurological: Positive for weakness. Negative for numbness.       Objective:   Physical Exam  Constitutional: She is oriented to person, place, and time. She appears well-developed and well-nourished. No distress.  HENT:  Head: Normocephalic and atraumatic.  Eyes: Pupils are equal, round, and reactive to light.  Neck: Neck supple.  Cardiovascular: Normal rate.   Pulmonary/Chest: Effort normal. No respiratory distress.  Musculoskeletal: Normal range of motion.  Neurological: She is alert and oriented to person, place, and time. Coordination normal.  Skin: Skin is warm and dry. She is not diaphoretic.  Multiple one to two cm lacerations on anterior and posterior right calf. Significant redness over mid portion of right shin.  Beneath the superior laceration site is an area of yellowish discoloration suspicious of abscess formation. Sensation in feet intact, good capillary refill.     Psychiatric: She has a normal mood and affect. Her behavior is normal.  Nursing note and vitals reviewed. . Results for orders placed or performed in visit on 04/21/15  POCT CBC  Result Value Ref Range   WBC 6.8 4.6 - 10.2 K/uL   Lymph, poc 1.6 0.6 - 3.4   POC LYMPH PERCENT 22.8 10 - 50 %L   MID (cbc) 0.5 0 - 0.9   POC MID % 7.5 0 - 12 %M   POC Granulocyte 4.7 2 - 6.9   Granulocyte percent 69.7 37 - 80 %G   RBC 4.27 4.04 - 5.48 M/uL   Hemoglobin 13.0 12.2 - 16.2 g/dL   HCT, POC 39.3 37.7 - 47.9 %   MCV 92.0 80 - 97 fL   MCH, POC 30.5 27 - 31.2 pg   MCHC 33.1 31.8 - 35.4 g/dL   RDW, POC 13.0 %   Platelet Count, POC 355 142 - 424 K/uL   MPV 7.1 0 - 99.8 fL       Assessment & Plan:  Patient with a history of dog bite to the right leg. She now has a significant secondary infection. Stitches need to be removed and evaluation for IV antibiotics and admission. Possibility of early compartment syndrome. She has not been able to keep her leg elevated. She will be allowed to go by private car. Her boyfriend is going to transport her.I personally performed the services described in this documentation, which  was scribed in my presence. The recorded information has been reviewed and is accurate.  Nena Jordan, MD

## 2015-04-22 ENCOUNTER — Inpatient Hospital Stay (HOSPITAL_COMMUNITY): Payer: 59

## 2015-04-22 DIAGNOSIS — W540XXD Bitten by dog, subsequent encounter: Secondary | ICD-10-CM

## 2015-04-22 DIAGNOSIS — W540XXA Bitten by dog, initial encounter: Secondary | ICD-10-CM

## 2015-04-22 DIAGNOSIS — S81851A Open bite, right lower leg, initial encounter: Secondary | ICD-10-CM

## 2015-04-22 DIAGNOSIS — S81851D Open bite, right lower leg, subsequent encounter: Secondary | ICD-10-CM

## 2015-04-22 DIAGNOSIS — L089 Local infection of the skin and subcutaneous tissue, unspecified: Secondary | ICD-10-CM | POA: Insufficient documentation

## 2015-04-22 DIAGNOSIS — B9689 Other specified bacterial agents as the cause of diseases classified elsewhere: Secondary | ICD-10-CM

## 2015-04-22 DIAGNOSIS — G43909 Migraine, unspecified, not intractable, without status migrainosus: Secondary | ICD-10-CM

## 2015-04-22 LAB — BASIC METABOLIC PANEL
Anion gap: 8 (ref 5–15)
BUN: 13 mg/dL (ref 6–20)
CO2: 22 mmol/L (ref 22–32)
Calcium: 8.6 mg/dL — ABNORMAL LOW (ref 8.9–10.3)
Chloride: 110 mmol/L (ref 101–111)
Creatinine, Ser: 0.87 mg/dL (ref 0.44–1.00)
GFR calc Af Amer: >60 mL/min (ref >60)
GFR calc non Af Amer: >60 mL/min (ref >60)
Glucose, Bld: 99 mg/dL (ref 65–99)
Potassium: 3.7 mmol/L (ref 3.5–5.1)
Sodium: 140 mmol/L (ref 135–145)

## 2015-04-22 LAB — CBC
HCT: 35.3 % — ABNORMAL LOW (ref 36.0–46.0)
Hemoglobin: 11.8 g/dL — ABNORMAL LOW (ref 12.0–15.0)
MCH: 30.6 pg (ref 26.0–34.0)
MCHC: 33.4 g/dL (ref 30.0–36.0)
MCV: 91.5 fL (ref 78.0–100.0)
Platelets: 274 10*3/uL (ref 150–400)
RBC: 3.86 MIL/uL — ABNORMAL LOW (ref 3.87–5.11)
RDW: 12.5 % (ref 11.5–15.5)
WBC: 6.9 10*3/uL (ref 4.0–10.5)

## 2015-04-22 MED ORDER — GADOBENATE DIMEGLUMINE 529 MG/ML IV SOLN
15.0000 mL | Freq: Once | INTRAVENOUS | Status: AC | PRN
  Administered 2015-04-22: 15 mL via INTRAVENOUS

## 2015-04-22 MED ORDER — ENOXAPARIN SODIUM 40 MG/0.4ML ~~LOC~~ SOLN
40.0000 mg | SUBCUTANEOUS | Status: DC
  Administered 2015-04-22: 40 mg via SUBCUTANEOUS
  Filled 2015-04-22: qty 0.4

## 2015-04-22 MED ORDER — DIPHENHYDRAMINE HCL 25 MG PO CAPS
25.0000 mg | ORAL_CAPSULE | Freq: Three times a day (TID) | ORAL | Status: DC | PRN
  Administered 2015-04-22 – 2015-04-23 (×2): 25 mg via ORAL
  Filled 2015-04-22 (×2): qty 1

## 2015-04-22 MED ORDER — OXYCODONE-ACETAMINOPHEN 5-325 MG PO TABS
1.0000 | ORAL_TABLET | Freq: Four times a day (QID) | ORAL | Status: DC | PRN
Start: 2015-04-22 — End: 2015-04-23
  Administered 2015-04-22 – 2015-04-23 (×4): 2 via ORAL
  Filled 2015-04-22 (×4): qty 2

## 2015-04-22 MED ORDER — MORPHINE SULFATE 2 MG/ML IJ SOLN
2.0000 mg | INTRAMUSCULAR | Status: DC | PRN
  Administered 2015-04-22 – 2015-04-23 (×14): 2 mg via INTRAVENOUS
  Filled 2015-04-22 (×14): qty 1

## 2015-04-22 NOTE — Progress Notes (Signed)
Patient ID: Patricia Erickson, female   DOB: August 10, 1971, 44 y.o.   MRN: [id number] I did stop by the ED and see Mrs. Patricia Erickson last night.  She does have cellulitis around her right leg from the dog bites.  I agree with the IV antibiotics and did not feel any areas of fluctuance.  I am awaiting the MRI results to assess for any abscess, but looks like just cellulitis thus far.  I will follow-up with her for a repeat exam later this morning.

## 2015-04-22 NOTE — Progress Notes (Signed)
Paged Dr x2 regarding pt c/o pain to RLE. States morphine that was administered at 2332 provided no relief. Pt aware that Dr was paged regarding pain control measures. Pt was p/u for MRI and shortly after this RN received call back from Dr. Reola Calkins orders for pain management received. Will medicate pain upon re arrival to unit.

## 2015-04-22 NOTE — Progress Notes (Signed)
Subjective: Patient with moderate pain this morning. She is asking for oral pain medication instead of IV. Erythema and swelling seem to have improved. She notes decreased appetite.   Objective: Vital signs in last 24 hours: Filed Vitals:   04/21/15 2045 04/21/15 2239 04/21/15 2258 04/22/15 0606  BP: 109/69 102/53 118/70 105/68  Pulse: 81 80 82 75  Temp:  98.2 F (36.8 C) 98.2 F (36.8 C) 97.7 F (36.5 C)  TempSrc:  Oral Oral   Resp: 20 18 18 18   Height:   5\' 5"  (1.651 m)   Weight:   170 lb 3.1 oz (77.2 kg)   SpO2: 99% 100% 98% 100%   Weight change:   Intake/Output Summary (Last 24 hours) at 04/22/15 1027 Last data filed at 04/22/15 0414  Gross per 24 hour  Intake    570 ml  Output    250 ml  Net    320 ml   Physical Exam General: middle aged woman resting in bed HEENT: [place]/AT, EOMI, sclera anicteric, mucus membranes moist CV: RRR, no m/g/r Pulm: CTA bilaterally, breaths non-labored Abd: BS+, soft, non-tender Ext: RLE with multiple linear lacerations on the lateral and medial sides. There is mild erythema and tenderness to palpation. No fluctuance noted. Distal pulses 2+. Serosanguinous drainage from lacerations.  Neuro: alert and oriented x 3, no focal deficits   Lab Results: Basic Metabolic Panel:  Recent Labs Lab 04/21/15 1938 04/22/15 0754  NA 142 140  K 3.7 3.7  CL 111 110  CO2 22 22  GLUCOSE 107* 99  BUN 16 13  CREATININE 0.77 0.87  CALCIUM 9.1 8.6*   CBC:  Recent Labs Lab 04/21/15 1938 04/22/15 0754  WBC 7.3 6.9  NEUTROABS 4.8  --   HGB 12.7 11.8*  HCT 37.6 35.3*  MCV 91.5 91.5  PLT 305 274    Studies/Results: Dg Tibia/fibula Right  04/21/2015   CLINICAL DATA:  Right lower extremity infection after dog bite.  EXAM: RIGHT TIBIA AND FIBULA - 2 VIEW  COMPARISON:  None.  FINDINGS: There is no evidence of fracture or other focal bone lesions. Soft tissues are unremarkable. No radiopaque foreign body is noted.  IMPRESSION: Normal right tibia  and fibula.   Electronically Signed   By: Marijo Conception, M.D.   On: 04/21/2015 20:30   Mr Tibia Fibula Right W Wo Contrast  04/22/2015   CLINICAL DATA:  Pain and swelling of the right lower leg after dog bite on 04/16/2015. Right lower leg cellulitis.  EXAM: MRI OF LOWER RIGHT EXTREMITY WITHOUT AND WITH CONTRAST  TECHNIQUE: Multiplanar, multisequence MR imaging of the right lower leg was performed both before and after administration of intravenous contrast.  CONTRAST:  47mL MULTIHANCE GADOBENATE DIMEGLUMINE 529 MG/ML IV SOLN  COMPARISON:  Radiographs dated 04/21/2015  FINDINGS: There is circumferential subcutaneous edema with fluid along the superficial fascia planes in the right lower leg consistent with cellulitis. There are focal areas of hemorrhage in the subcutaneous fat at the site of the bite wounds medially and laterally. There is enhancement around these areas after contrast administration. I do not think these represent abscesses.  The bite wounds do extend into the anterior tibialis muscle laterally and into the soleus and medial head of the gastrocnemius muscle medially.  The tibia and fibula appear normal.  IMPRESSION: Diffuse cellulitis of the right lower leg. Bite wounds with small areas of hemorrhage in the subcutaneous tissues and the anterior tibialis and soleus and medial head of  the gastrocnemius muscles. No abscesses.   Electronically Signed   By: Lorriane Shire M.D.   On: 04/22/2015 09:15   Medications: I have reviewed the patient's current medications.  Assessment/Plan:  RLE Cellulitis 2/2 Dog Bite: Persistent cellulitis that has failed treatment with Augmentin. No systemic signs of infection, has remained afebrile and WBC count normal. No evidence of foreign body or subcutaneous air on plain film. MRI consistent with diffuse cellulitis, no evidence of abscesses. Will continue with IV antibiotics today and then consider transitioning to PO abx tomorrow since already having a good  response.  - Orthopedics following, appreciate recommendations  - Continue with Vancomycin and Zosyn - Consider transitioning to Doxycycline (covers P multocida) and Metronidazole (anaerobes) tomorrow  - Pain control with Percocet 5-325 mg 1-2 tablets Q6H PRN and Morphine 2 mg Q2H PRN   Migraines: No acute issues. At home, patient is on gabapentin 300 mg daily at bedtime and topiramate 200 mg daily at bedtime. Additionally, nabumetone and chlorpromazine PRN for severe migraines. -Continue home regimen.  Diet: Regular  VTE PPx: Lovenox SQ Dispo: Disposition is deferred at this time, awaiting improvement of current medical problems.  Anticipated discharge in approximately 1-2 day(s).   The patient does not have a current PCP (No Pcp Per Patient) and does need an [facility] hospital follow-up appointment after discharge.  The patient does not have transportation limitations that hinder transportation to clinic appointments.  .Services Needed at time of discharge: Y = Yes, Blank = No PT:   OT:   RN:   Equipment:   Other:     LOS: 1 day   Juliet Rude, MD 04/22/2015, 10:27 AM

## 2015-04-22 NOTE — Progress Notes (Signed)
Patient ID: KARRIE FLUELLEN, female   DOB: 1971/03/13, 44 y.o.   MRN: [id number] She seems to be responding to the IV antibiotics thus far.  Her swelling and the redness is much less than when I saw her last evening.  Her pain has also decreased.  I can not palpate any areas of fluctuance.  I did just look at her MRI and do see stranding in the soft tissues consistent with cellulitis, but no specific area of abscess.  I will follow-up on the MRI when the official read is made.  Given her progress thus far, I do not anticipate having to proceed to surgery today.  I will continue close follow-up on her clinical exam.

## 2015-04-23 MED ORDER — METRONIDAZOLE 500 MG PO TABS: 500.0000 mg | ORAL_TABLET | Freq: Three times a day (TID) | ORAL | Status: DC

## 2015-04-23 MED ORDER — HYDROCODONE-ACETAMINOPHEN 10-325 MG PO TABS: 1.0000 | ORAL_TABLET | ORAL | Status: DC | PRN

## 2015-04-23 MED ORDER — HYDROCODONE-ACETAMINOPHEN 10-325 MG PO TABS
1.0000 | ORAL_TABLET | ORAL | Status: DC | PRN
  Administered 2015-04-23: 1 via ORAL
  Filled 2015-04-23: qty 1

## 2015-04-23 MED ORDER — DOXYCYCLINE HYCLATE 100 MG PO TABS: 100.0000 mg | ORAL_TABLET | Freq: Two times a day (BID) | ORAL | Status: DC

## 2015-04-23 MED ORDER — DOXYCYCLINE HYCLATE 100 MG PO TABS
100.0000 mg | ORAL_TABLET | Freq: Two times a day (BID) | ORAL | Status: DC
Start: 2015-04-23 — End: 2015-04-23
  Administered 2015-04-23: 100 mg via ORAL
  Filled 2015-04-23: qty 1

## 2015-04-23 MED ORDER — METRONIDAZOLE 500 MG PO TABS
500.0000 mg | ORAL_TABLET | Freq: Three times a day (TID) | ORAL | Status: DC
  Administered 2015-04-23: 500 mg via ORAL
  Filled 2015-04-23: qty 1

## 2015-04-23 NOTE — Discharge Instructions (Signed)
You may shower and use soap to wound.  Please take doxycycline 100mg  twice a day (stay out of the sun)  Take Metronidazole 500mg  TID (dont drink alcohol on this medication)  Call the clinic at [phone] if you need to be seen earlier, otherwise we will try to set up an appointment for you next week.

## 2015-04-23 NOTE — Discharge Summary (Signed)
Name: Patricia Erickson MRN: [id number] DOB: May 21, 1971 44 y.o. PCP: No Pcp Per Patient  Date of Admission: 04/21/2015  6:20 PM Date of Discharge: 04/23/2015 Attending Physician: Bartholomew Crews, MD  Discharge Diagnosis: Active Problems:   Cellulitis of right lower leg   Dog bite of right lower leg with infection   Animal bite of lower leg  Discharge Medications:   Medication List    STOP taking these medications        amoxicillin-clavulanate 875-125 MG per tablet  Commonly known as:  AUGMENTIN     oxyCODONE-acetaminophen 5-325 MG per tablet  Commonly known as:  PERCOCET/ROXICET      TAKE these medications        chlorproMAZINE 25 MG tablet  Commonly known as:  THORAZINE  Take 25 mg by mouth as needed (for migraines).     doxycycline 100 MG tablet  Commonly known as:  VIBRA-TABS  Take 1 tablet (100 mg total) by mouth every 12 (twelve) hours.     gabapentin 100 MG capsule  Commonly known as:  NEURONTIN  Take 300 mg by mouth at bedtime.     HYDROcodone-acetaminophen 10-325 MG per tablet  Commonly known as:  NORCO  Take 1 tablet by mouth every 4 (four) hours as needed for severe pain.     levonorgestrel 20 MCG/24HR IUD  Commonly known as:  MIRENA  1 each by Intrauterine route once. INSERTED 05/06/2007     metroNIDAZOLE 500 MG tablet  Commonly known as:  FLAGYL  Take 1 tablet (500 mg total) by mouth 3 (three) times daily.     nabumetone 750 MG tablet  Commonly known as:  RELAFEN  Take 750 mg by mouth daily as needed (for migraines).     topiramate 200 MG tablet  Commonly known as:  TOPAMAX  Take 200 mg by mouth at bedtime.        Disposition and follow-up:   [name] was discharged from [facility] in Good condition.  At the hospital follow up visit please address:  1.  Assess RLE, resolution of Cellulitis on Doxycycline and Metronidazole.  2. PCP: patient may establish with [facility] or return to [place].  2.  Labs / imaging needed  at time of follow-up: none  3.  Pending labs/ test needing follow-up: Wound culture (no growth x 2 days)  Follow-up Appointments:     Follow-up Information    Follow up with [facility].   Why:  will call you for hospital follow up in 1 week, telephone number is [phone]   Contact information:   [place] [address]       Discharge Instructions: Discharge Instructions    Call MD for:  severe uncontrolled pain    Complete by:  As directed      Call MD for:  temperature >100.4    Complete by:  As directed      Diet - low sodium heart healthy    Complete by:  As directed      Discharge instructions    Complete by:  As directed   Please call our clinic if you have any issues, [phone]     Increase activity slowly    Complete by:  As directed      No dressing needed    Complete by:  As directed            Consultations:    Procedures Performed:  Dg Tibia/fibula Right  04/21/2015   CLINICAL DATA:  Right lower extremity infection after dog bite.  EXAM: RIGHT TIBIA AND FIBULA - 2 VIEW  COMPARISON:  None.  FINDINGS: There is no evidence of fracture or other focal bone lesions. Soft tissues are unremarkable. No radiopaque foreign body is noted.  IMPRESSION: Normal right tibia and fibula.   Electronically Signed   By: Marijo Conception, M.D.   On: 04/21/2015 20:30   Mr Tibia Fibula Right W Wo Contrast  04/22/2015   CLINICAL DATA:  Pain and swelling of the right lower leg after dog bite on 04/16/2015. Right lower leg cellulitis.  EXAM: MRI OF LOWER RIGHT EXTREMITY WITHOUT AND WITH CONTRAST  TECHNIQUE: Multiplanar, multisequence MR imaging of the right lower leg was performed both before and after administration of intravenous contrast.  CONTRAST:  4mL MULTIHANCE GADOBENATE DIMEGLUMINE 529 MG/ML IV SOLN  COMPARISON:  Radiographs dated 04/21/2015  FINDINGS: There is circumferential subcutaneous edema with fluid along the superficial  fascia planes in the right lower leg consistent with cellulitis. There are focal areas of hemorrhage in the subcutaneous fat at the site of the bite wounds medially and laterally. There is enhancement around these areas after contrast administration. I do not think these represent abscesses.  The bite wounds do extend into the anterior tibialis muscle laterally and into the soleus and medial head of the gastrocnemius muscle medially.  The tibia and fibula appear normal.  IMPRESSION: Diffuse cellulitis of the right lower leg. Bite wounds with small areas of hemorrhage in the subcutaneous tissues and the anterior tibialis and soleus and medial head of the gastrocnemius muscles. No abscesses.   Electronically Signed   By: Lorriane Shire M.D.   On: 04/22/2015 09:15    Admission HPI: Patient is a 44 year old female with a history of migraines who presents to the emergency department 5 days after a dog bite. She states that she was involved in an altercation between her own dog and another dog and was bitten by her own dog. She states that there was profuse bleeding and swelling in her leg began shortly after the incident. She was evaluated initially at an urgent care soon afterwards and received stitches as well as a course of Augmentin as well as Percocet for pain. She states that over the last 5 days, the pain, erythema, and swelling have gotten progressively worse. She states that she has not been taking the Percocet regularly since she has been continuing to work. However, she has been compliant with her Augmentin therapy no major side effects. She was seen again in urgent care the day of presentation and was sent to the emergency department because of concern for increasing infection and possibility of underlying abscess.  Otherwise, patient denies any fevers, chills, chest pain, dyspnea, abdominal pain, dysuria, hematuria, constipation, or diarrhea. In the emergency department, patient received vancomycin,  Zosyn and morphine with successful alleviation of her pain. Her stitches were removed.  Hospital Course by problem list:    Cellulitis of right lower leg due to Dog bite Patient presented for persistent cellulitis that has failed treatment with Augmentin. No systemic signs of infection.  No evidence of foreign body or subcutaneous air on plain film. MRI consistent with diffuse cellulitis, no evidence of abscesses. She was treated for 48 hours with IV Vanc and Zosyn with improvement of the erythema and swelling.  And then transitioned to Doxycycline (covers P multocida) and Metronidazole (anaerobes).  She was given an additional 8  day course of Abx, recommend treatment until cellulitis resolves completely.  Our office should call to give her a follow up appointment next week to reassess. She will call the office with any issues. She was seen by orthopaedics who agreed with the plan.  Her dog is up to date on his vaccines and is currently in quarantine at her home.  She was given a prescription for Hydrocodone for pain.    Discharge Vitals:   BP 113/71 mmHg  Pulse 64  Temp(Src) 98 F (36.7 C) (Oral)  Resp 18  Ht 5\' 5"  (1.651 m)  Wt 170 lb 3.1 oz (77.2 kg)  BMI 28.32 kg/m2  SpO2 100%  Discharge Labs:  No results found for this or any previous visit (from the past 24 hour(s)).  Signed: Lucious Groves, DO 04/23/2015, 10:48 AM    Services Ordered on Discharge: none Equipment Ordered on Discharge: none (patient has shower bench and crutches already)

## 2015-04-23 NOTE — Progress Notes (Signed)
Subjective: Reports she feels that her leg is continuing to improve. She did have some mild itching last night which she felt may be due to percocet.  Objective: Vital signs in last 24 hours: Filed Vitals:   04/22/15 0606 04/22/15 1334 04/22/15 2215 04/23/15 0545  BP: 105/68 100/52 107/63 113/71  Pulse: 75 72 60 64  Temp: 97.7 F (36.5 C) 97.7 F (36.5 C) 97.3 F (36.3 C) 98 F (36.7 C)  TempSrc:  Oral Oral Oral  Resp: 18 18 18 18   Height:      Weight:      SpO2: 100% 100% 100% 100%   Weight change:   Intake/Output Summary (Last 24 hours) at 04/23/15 0933 Last data filed at 04/23/15 0900  Gross per 24 hour  Intake    920 ml  Output    300 ml  Net    620 ml   Physical Exam General: resting in bed HEENT: PERRL, EOMI, no scleral icterus Cardiac: RRR, no rubs, murmurs or gallops Pulm: clear to auscultation bilaterally, moving normal volumes of air Abd: soft, nontender, nondistended, BS present Ext: RLE with multiple linear lacerations on the lateral and medial sides. errythme continues to improve as well as swelling, she continues to have some tenderness to palpation. No fluctuance noted. Distal pulses 2+. Serosanguinous drainage from lacerations.  Neuro: alert and oriented x 3, no focal deficits   Lab Results: Basic Metabolic Panel:  Recent Labs Lab 04/21/15 1938 04/22/15 0754  NA 142 140  K 3.7 3.7  CL 111 110  CO2 22 22  GLUCOSE 107* 99  BUN 16 13  CREATININE 0.77 0.87  CALCIUM 9.1 8.6*   CBC:  Recent Labs Lab 04/21/15 1938 04/22/15 0754  WBC 7.3 6.9  NEUTROABS 4.8  --   HGB 12.7 11.8*  HCT 37.6 35.3*  MCV 91.5 91.5  PLT 305 274    Studies/Results: Dg Tibia/fibula Right  04/21/2015   CLINICAL DATA:  Right lower extremity infection after dog bite.  EXAM: RIGHT TIBIA AND FIBULA - 2 VIEW  COMPARISON:  None.  FINDINGS: There is no evidence of fracture or other focal bone lesions. Soft tissues are unremarkable. No radiopaque foreign body is noted.   IMPRESSION: Normal right tibia and fibula.   Electronically Signed   By: Marijo Conception, M.D.   On: 04/21/2015 20:30   Mr Tibia Fibula Right W Wo Contrast  04/22/2015   CLINICAL DATA:  Pain and swelling of the right lower leg after dog bite on 04/16/2015. Right lower leg cellulitis.  EXAM: MRI OF LOWER RIGHT EXTREMITY WITHOUT AND WITH CONTRAST  TECHNIQUE: Multiplanar, multisequence MR imaging of the right lower leg was performed both before and after administration of intravenous contrast.  CONTRAST:  58mL MULTIHANCE GADOBENATE DIMEGLUMINE 529 MG/ML IV SOLN  COMPARISON:  Radiographs dated 04/21/2015  FINDINGS: There is circumferential subcutaneous edema with fluid along the superficial fascia planes in the right lower leg consistent with cellulitis. There are focal areas of hemorrhage in the subcutaneous fat at the site of the bite wounds medially and laterally. There is enhancement around these areas after contrast administration. I do not think these represent abscesses.  The bite wounds do extend into the anterior tibialis muscle laterally and into the soleus and medial head of the gastrocnemius muscle medially.  The tibia and fibula appear normal.  IMPRESSION: Diffuse cellulitis of the right lower leg. Bite wounds with small areas of hemorrhage in the subcutaneous tissues and the anterior tibialis  and soleus and medial head of the gastrocnemius muscles. No abscesses.   Electronically Signed   By: Lorriane Shire M.D.   On: 04/22/2015 09:15   Medications: I have reviewed the patient's current medications.  Assessment/Plan:  RLE Cellulitis 2/2 Dog Bite: Persistent cellulitis that has failed treatment with Augmentin. No systemic signs of infection, has remained afebrile and WBC count normal. No evidence of foreign body or subcutaneous air on plain film. MRI consistent with diffuse cellulitis, no evidence of abscesses.  - Orthopedics following, OK to go home today - D/C Vancomycin and Zosyn - Start  Doxycycline (covers P multocida) and Metronidazole (anaerobes) - Pain control (had itching with percocet) change to Hydrocodone tolerated 1st dose well will discharge with this medication - Dog is in quarantine at home.  Migraines: No acute issues. At home, patient is on gabapentin 300 mg daily at bedtime and topiramate 200 mg daily at bedtime. Additionally, nabumetone and chlorpromazine PRN for severe migraines. -Continue home regimen.  Diet: Regular  VTE PPx: Lovenox SQ Dispo: Discharge home today, will give 8 day abx course. Patient will be seen in 1 week for hospital follow up.  The patient does not have a current PCP (No Pcp Per Patient) and does need an [facility] hospital follow-up appointment after discharge.  The patient does not have transportation limitations that hinder transportation to clinic appointments.  .Services Needed at time of discharge: Y = Yes, Blank = No PT:   OT:   RN:   Equipment:   Other:     LOS: 2 days   Lucious Groves, DO 04/23/2015, 9:33 AM

## 2015-04-23 NOTE — Progress Notes (Signed)
Patient ID: Patricia Erickson, female   DOB: February 19, 1971, 44 y.o.   MRN: [id number] Much improved overall with decreased swelling, redness, and pain.  The MRI was consistent with a cellulitis.  From my standpoint, she can be discharged to home on oral antibiotics - I agree with the abx from her primary service's earlier note this am.  She should increase her activities as comfort allows.  She has my card and number and only needs follow-up if things worsen at all.  Elevation as needed for swelling and she can get her leg wet in the shower with local wound care prn.

## 2015-04-24 LAB — WOUND CULTURE
Culture: NO GROWTH
Gram Stain: NONE SEEN

## 2015-04-27 ENCOUNTER — Telehealth: Payer: Self-pay | Admitting: *Deleted

## 2015-04-27 MED ORDER — ONDANSETRON HCL 4 MG PO TABS: 4.0000 mg | ORAL_TABLET | Freq: Every day | ORAL | Status: DC | PRN

## 2015-04-27 NOTE — Telephone Encounter (Signed)
Rx sent for Zofran. Patient informed by telephone.

## 2015-04-27 NOTE — Telephone Encounter (Signed)
Pt called with a c/o nausea.   She was hospitalize s/p  dog bite with cellulitis.  Discharged home late sunday. Still on antibiotics. Mild nausea on and off since d/c but today nauseated all day. She would like something called in for her.   Still using a walker, swelling to leg is better, eating and drinking okay.  Afebrile   Pt # [id number]

## 2015-04-28 ENCOUNTER — Telehealth: Payer: Self-pay | Admitting: *Deleted

## 2015-04-28 MED ORDER — FLUCONAZOLE 150 MG PO TABS: 150.0000 mg | ORAL_TABLET | Freq: Once | ORAL | Status: DC

## 2015-04-28 NOTE — Telephone Encounter (Signed)
Pt informed, Rx sent. 

## 2015-04-28 NOTE — Telephone Encounter (Signed)
Pt was placed on antibiotic by other physician, now has yeast infection itching and white discharge. Asked if you would be willing to prescribe Rx. Please advise

## 2015-04-28 NOTE — Telephone Encounter (Signed)
Diflucan 150 mg one by mouth 

## 2015-05-01 ENCOUNTER — Encounter: Payer: Self-pay | Admitting: Internal Medicine

## 2015-05-01 ENCOUNTER — Ambulatory Visit (INDEPENDENT_AMBULATORY_CARE_PROVIDER_SITE_OTHER): Payer: 59 | Admitting: Internal Medicine

## 2015-05-01 VITALS — BP 126/63 | HR 76 | Temp 98.3°F

## 2015-05-01 DIAGNOSIS — B9689 Other specified bacterial agents as the cause of diseases classified elsewhere: Secondary | ICD-10-CM | POA: Diagnosis not present

## 2015-05-01 DIAGNOSIS — W540XXD Bitten by dog, subsequent encounter: Secondary | ICD-10-CM | POA: Diagnosis not present

## 2015-05-01 DIAGNOSIS — L03115 Cellulitis of right lower limb: Secondary | ICD-10-CM

## 2015-05-01 DIAGNOSIS — S81851D Open bite, right lower leg, subsequent encounter: Secondary | ICD-10-CM

## 2015-05-01 MED ORDER — NAPROXEN 500 MG PO TABS
500.0000 mg | ORAL_TABLET | Freq: Two times a day (BID) | ORAL | Status: DC | PRN
Start: 2015-05-01 — End: 2015-06-21

## 2015-05-01 NOTE — Patient Instructions (Addendum)
You can return to work today, remember to elevate your right leg at work and at home. Continue to manage your pain with the hydrocodone-acetaminophen 10/325 at night; try the naproxen 500 mg tablet during the day if you are experiencing significant pain during the day.  Continue to try to use your leg as you can tolerate it.  General Instructions:   Please bring your medicines with you each time you come to clinic.  Medicines may include prescription medications, over-the-counter medications, herbal remedies, eye drops, vitamins, or other pills.   Progress Toward Treatment Goals:  No flowsheet data found.  Self Care Goals & Plans:  No flowsheet data found.  No flowsheet data found.   Care Management & Community Referrals:  No flowsheet data found.

## 2015-05-01 NOTE — Progress Notes (Signed)
[facility] Subjective:   Patient ID: Patricia Erickson Key female   DOB: 09/25/1971 44 y.o.   MRN: [id number]  HPI: [name] Key is a 44 y.o. female with a history of anxiety and migraine headaches who is here for hospital follow up. She was hospitalized from 5/13 to 5/15 for right lower extremity cellulitis following a significant lower extremity wound from a dog bite. She had failed outpatient treatment with augmentin and was found to have diffuse cellulitis without an abscess on MRI of her right tibia/fibula. Orthopaedics was consulted during her hospitalization. Her symptoms improved with 48 hours of IV vancomycin and zosyn and she was transitioned to doxycycline and metronidazole at discharge; today is her last day of the course of each of these antibiotics.  Fortunately, the dog who bit her is up to date on vaccines. Her wound culture, which was not final at discharge, showed no white blood cells, no squamous epithelial cells, no organisms and no growth.  Since discharge, she feels that all of her symptoms have improved, but she is concerned at the slow rate of improvement. She initially was experiencing 10/10 pain, requiring her Norco 10-325 every 4 hours around the clock. Now, she takes just one Norco before bed. She believes that the swelling in her right leg has gone down, but that her leg is still larger than it was at baseline (and larger than her left). She denies chest pain, shortness of breath, fever or chills.   Past Medical History  Diagnosis Date  . Polyp of colon 03/11/2007    BENIGN  . Anxiety   . Migraines    Current Outpatient Prescriptions  Medication Sig Dispense Refill  . chlorproMAZINE (THORAZINE) 25 MG tablet Take 25 mg by mouth as needed (for migraines).     Marland Kitchen doxycycline (VIBRA-TABS) 100 MG tablet Take 1 tablet (100 mg total) by mouth every 12 (twelve) hours. 16 tablet 0  . fluconazole (DIFLUCAN) 150 MG tablet Take 1 tablet (150 mg total) by mouth  once. 1 tablet 0  . gabapentin (NEURONTIN) 100 MG capsule Take 300 mg by mouth at bedtime.     Marland Kitchen HYDROcodone-acetaminophen (NORCO) 10-325 MG per tablet Take 1 tablet by mouth every 4 (four) hours as needed for severe pain. 45 tablet 0  . levonorgestrel (MIRENA) 20 MCG/24HR IUD 1 each by Intrauterine route once. INSERTED 05/06/2007     . metroNIDAZOLE (FLAGYL) 500 MG tablet Take 1 tablet (500 mg total) by mouth 3 (three) times daily. 24 tablet 0  . nabumetone (RELAFEN) 750 MG tablet Take 750 mg by mouth daily as needed (for migraines).     . naproxen (NAPROSYN) 500 MG tablet Take 1 tablet (500 mg total) by mouth 2 (two) times daily as needed (take as needed with meals). 60 tablet 0  . ondansetron (ZOFRAN) 4 MG tablet Take 1 tablet (4 mg total) by mouth daily as needed for nausea or vomiting. 30 tablet 1  . topiramate (TOPAMAX) 200 MG tablet Take 200 mg by mouth at bedtime.     No current facility-administered medications for this visit.   Family History  Problem Relation Age of Onset  . Cancer Mother     COLON  . Osteoporosis Mother   . Breast cancer Mother 78  . Hypertension Father   . Osteoporosis Maternal Grandmother    History   Social History  . Marital Status: Divorced    Spouse Name: N/A  . Number of Children: N/A  .  Years of Education: N/A   Social History Main Topics  . Smoking status: Former Smoker    Quit date: 10/12/1992  . Smokeless tobacco: Never Used  . Alcohol Use: 0.0 oz/week    0 Standard drinks or equivalent per week     Comment: OCCASIONALLY  . Drug Use: No  . Sexual Activity: Yes    Birth Control/ Protection: IUD     Comment: Mirena 10/29/2012   Other Topics Concern  . None   Social History Narrative   Review of Systems: General: no recent illness, recent hospitalization  Skin: see HPI HEENT: no headaches, no blurred vision Cardiac: no chest pain, no palpitations Respiratory: no shortness of breath GI: no abdominal pain, no nausea or  vomiting Urinary: no changes Msk: pain when she puts any pressure on her right foot, pain on flexion of right foot; this pain is similar but less intense to the pain she felt in the hospital Psychiatric: history of anxiety    Objective:  Physical Exam: Filed Vitals:   05/01/15 0834  BP: 126/63  Pulse: 76  Temp: 98.3 F (36.8 C)  TempSrc: Oral  SpO2: 100%   Appearance: in NAD, sitting in hospital wheelchair, walker in room HEENT: AT/[place], PERRL, EOMi Heart: RRR, normal S1S2 Lungs: CTAB, no wheezes Abdomen: BS+, soft, nontender Musculoskeletal: right lower extremity is larger than left, wounds are healing well but remain tender, no fluctuance, no erythema, no warmth, significant pain on foot flexion and on deep palpation near wounds Neurologic: A&Ox3, sensation and pulses fully intact in lower extremities   Assessment & Plan:  Case discussed with Dr. Lynnae January  Cellulitis of right lower leg Cellulitis appears to be gone; patient's feet are cooler than the rest of her lower extremity, but lower extremities are symmetric in warmth. Right lower extremity continues to be larger than the left, but this swelling has decreased since discharge. Persistent tenderness. Today is end of 8 day course of doxycycline and metronidazole.  - Continue hydrocodone-acetaminophen 10-325 mg by mouth for pain at night - If pain persists, patient to fill script for naproxen 500 mg by mouth during the day  - Patient will be returning to work today; return to work note given with specification that patient needs to elevate right leg at her desk   Animal bite of lower leg Wounds from dog bite appear to be healing well, but lower extremity remains tender.  - Slowly attempt to increase use of lower extremity as tolerated (patient currently using walker) - Continue hydrocodone-acetaminophen 10/325 mg by mouth for pain at night - If pain persists, patient to fill script for naproxen 500 mg by mouth during the day   - Patient will be returning to work today; return to work note given with specification that patient needs to elevate right leg at her desk     Medications Ordered Meds ordered this encounter  Medications  . naproxen (NAPROSYN) 500 MG tablet    Sig: Take 1 tablet (500 mg total) by mouth 2 (two) times daily as needed (take as needed with meals).    Dispense:  60 tablet    Refill:  0   Other Orders No orders of the defined types were placed in this encounter.

## 2015-05-02 NOTE — Assessment & Plan Note (Addendum)
Wounds from dog bite appear to be healing well, but lower extremity remains tender.  - Slowly attempt to increase use of lower extremity as tolerated (patient currently using walker) - Continue hydrocodone-acetaminophen 10/325 mg by mouth for pain at night - If pain persists, patient to fill script for naproxen 500 mg by mouth during the day  - Patient will be returning to work today; return to work note given with specification that patient needs to elevate right leg at her desk

## 2015-05-02 NOTE — Assessment & Plan Note (Addendum)
Cellulitis appears to be gone; patient's feet are cooler than the rest of her lower extremity, but lower extremities are symmetric in warmth. Right lower extremity continues to be larger than the left, but this swelling has decreased since discharge. Persistent tenderness. Today is end of 8 day course of doxycycline and metronidazole.  - Continue hydrocodone-acetaminophen 10-325 mg by mouth for pain at night - If pain persists, patient to fill script for naproxen 500 mg by mouth during the day  - Patient will be returning to work today; return to work note given with specification that patient needs to elevate right leg at her desk

## 2015-05-02 NOTE — Progress Notes (Signed)
Internal Medicine Clinic Attending  I saw and evaluated the patient.  I personally confirmed the key portions of the history and exam documented by Dr. Sherrine Maples and I reviewed pertinent patient test results.  The assessment, diagnosis, and plan were formulated together and I agree with the documentation in the resident's note.

## 2015-05-11 ENCOUNTER — Other Ambulatory Visit: Payer: Self-pay | Admitting: *Deleted

## 2015-05-11 NOTE — Telephone Encounter (Signed)
Pt called for refill of med. Last given 5/15 # 45 Last visit 5/23

## 2015-05-12 MED ORDER — HYDROCODONE-ACETAMINOPHEN 10-325 MG PO TABS: 1.0000 | ORAL_TABLET | Freq: Every day | ORAL | Status: DC | PRN

## 2015-05-12 NOTE — Treatment Plan (Signed)
Patient called in for hydrocodone-acetaminophen 10-325 mg by mouth refill today (prescribed on 5/15/ at hospital discharge #45).   Called patient to check in on the status of her resolving lower extremity cellulitis and pain. She was hospitalized from 5/13 to 5/15 for right lower extremity cellulitis following a significant lower extremity wound from a dog bite and presented for hospital follow up in clinic on 5/23. At discharge from the hospital on 5/15, she was taking the hydrocodone-acetaminophen every 4 hours around the clock. By the time of her follow up visit, she was trying to take it just once a day.   Her symptoms are now improving and she has returned to work. She continues to prop her foot up on a box during the day for relief of symptoms. She is no longer using a walker and finds that the pain is tolerable when she walks on her foot, though she continues to step on her tip-toe on the affected side. For the past week, she has reliably only needed her hydrocodone-acetaminophen before bed (1 tablet).   - Will refill hydrocodone-acetaminophen 10-325 mg by mouth but will change frequency of dosing to one tablet per day as needed for severe pain - She has confirmed that she will take the tablet only when needed each night before bed to help with sleep - Encouraged patient to taper use of the medication as tolerated, as the medication will continue to need to be tapered - Gave a 30 day supply of 30 tablets - If patient requests a refill in a month (06/11/15), she should instead be scheduled for a follow up appointment for re-assessment, as pain should be better controlled by that time and further workup may be necessary

## 2015-06-08 ENCOUNTER — Ambulatory Visit (INDEPENDENT_AMBULATORY_CARE_PROVIDER_SITE_OTHER): Payer: 59 | Admitting: Family Medicine

## 2015-06-08 VITALS — BP 118/80 | HR 82 | Temp 98.2°F | Resp 18 | Ht 66.0 in | Wt 170.0 lb

## 2015-06-08 DIAGNOSIS — N3090 Cystitis, unspecified without hematuria: Secondary | ICD-10-CM

## 2015-06-08 DIAGNOSIS — R350 Frequency of micturition: Secondary | ICD-10-CM

## 2015-06-08 LAB — POCT URINALYSIS DIPSTICK
Bilirubin, UA: NEGATIVE
Glucose, UA: NEGATIVE
Ketones, UA: NEGATIVE
Nitrite, UA: NEGATIVE
Protein, UA: NEGATIVE
Spec Grav, UA: 1.025
Urobilinogen, UA: 0.2
pH, UA: 5.5

## 2015-06-08 LAB — POCT UA - MICROSCOPIC ONLY
Casts, Ur, LPF, POC: NEGATIVE
Crystals, Ur, HPF, POC: NEGATIVE
Mucus, UA: NEGATIVE
Yeast, UA: NEGATIVE

## 2015-06-08 MED ORDER — SULFAMETHOXAZOLE-TRIMETHOPRIM 800-160 MG PO TABS: 1.0000 | ORAL_TABLET | Freq: Two times a day (BID) | ORAL | Status: DC

## 2015-06-08 NOTE — Patient Instructions (Signed)
Drink plenty of fluids  Take the sulfamethoxazole one twice daily for infection  Return if worse at any time  Return as needed  Urinary Tract Infection Urinary tract infections (UTIs) can develop anywhere along your urinary tract. Your urinary tract is your body's drainage system for removing wastes and extra water. Your urinary tract includes two kidneys, two ureters, a bladder, and a urethra. Your kidneys are a pair of bean-shaped organs. Each kidney is about the size of your fist. They are located below your ribs, one on each side of your spine. CAUSES Infections are caused by microbes, which are microscopic organisms, including fungi, viruses, and bacteria. These organisms are so small that they can only be seen through a microscope. Bacteria are the microbes that most commonly cause UTIs. SYMPTOMS  Symptoms of UTIs may vary by age and gender of the patient and by the location of the infection. Symptoms in young women typically include a frequent and intense urge to urinate and a painful, burning feeling in the bladder or urethra during urination. Older women and men are more likely to be tired, shaky, and weak and have muscle aches and abdominal pain. A fever may mean the infection is in your kidneys. Other symptoms of a kidney infection include pain in your back or sides below the ribs, nausea, and vomiting. DIAGNOSIS To diagnose a UTI, your caregiver will ask you about your symptoms. Your caregiver also will ask to provide a urine sample. The urine sample will be tested for bacteria and white blood cells. White blood cells are made by your body to help fight infection. TREATMENT  Typically, UTIs can be treated with medication. Because most UTIs are caused by a bacterial infection, they usually can be treated with the use of antibiotics. The choice of antibiotic and length of treatment depend on your symptoms and the type of bacteria causing your infection. HOME CARE INSTRUCTIONS  If you  were prescribed antibiotics, take them exactly as your caregiver instructs you. Finish the medication even if you feel better after you have only taken some of the medication.  Drink enough water and fluids to keep your urine clear or pale yellow.  Avoid caffeine, tea, and carbonated beverages. They tend to irritate your bladder.  Empty your bladder often. Avoid holding urine for long periods of time.  Empty your bladder before and after sexual intercourse.  After a bowel movement, women should cleanse from front to back. Use each tissue only once. SEEK MEDICAL CARE IF:   You have back pain.  You develop a fever.  Your symptoms do not begin to resolve within 3 days. SEEK IMMEDIATE MEDICAL CARE IF:   You have severe back pain or lower abdominal pain.  You develop chills.  You have nausea or vomiting.  You have continued burning or discomfort with urination. MAKE SURE YOU:   Understand these instructions.  Will watch your condition.  Will get help right away if you are not doing well or get worse. Document Released: 09/04/2005 Document Revised: 05/26/2012 Document Reviewed: 01/03/2012 [facility] Patient Information 2015 [place], [place]. This information is not intended to replace advice given to you by your health care provider. Make sure you discuss any questions you have with your health care provider.

## 2015-06-08 NOTE — Progress Notes (Signed)
Subjective:  Patient ID: Patricia Erickson, female    DOB: 1971/09/02  Age: 44 y.o. MRN: [id number]  Patient been having dysuria for the last couple of days. Has only had about one urinary tract infection in the past. No fever. Otherwise doing well. Has a regular boyfriend.   Objective:   No CVA tenderness. Abdomen soft without mass or tenderness. Results for orders placed or performed in visit on 06/08/15  POCT urinalysis dipstick  Result Value Ref Range   Color, UA yellow    Clarity, UA clear    Glucose, UA neg    Bilirubin, UA neg    Ketones, UA neg    Spec Grav, UA 1.025    Blood, UA trace-lysed    pH, UA 5.5    Protein, UA neg    Urobilinogen, UA 0.2    Nitrite, UA neg    Leukocytes, UA moderate (2+) (A) Negative  POCT UA - Microscopic Only  Result Value Ref Range   WBC, Ur, HPF, POC TNTC    RBC, urine, microscopic 0-2    Bacteria, U Microscopic few    Mucus, UA neg    Epithelial cells, urine per micros 1-6    Crystals, Ur, HPF, POC neg    Casts, Ur, LPF, POC neg    Yeast, UA neg     Assessment & Plan:   Assessment: Dysuria Urinary tract infection/cystitis  Plan:  Urine culture Patient Instructions  Drink plenty of fluids  Take the sulfamethoxazole one twice daily for infection  Return if worse at any time  Return as needed  Urinary Tract Infection Urinary tract infections (UTIs) can develop anywhere along your urinary tract. Your urinary tract is your body's drainage system for removing wastes and extra water. Your urinary tract includes two kidneys, two ureters, a bladder, and a urethra. Your kidneys are a pair of bean-shaped organs. Each kidney is about the size of your fist. They are located below your ribs, one on each side of your spine. CAUSES Infections are caused by microbes, which are microscopic organisms, including fungi, viruses, and bacteria. These organisms are so small that they can only be seen through a microscope. Bacteria are the microbes  that most commonly cause UTIs. SYMPTOMS  Symptoms of UTIs may vary by age and gender of the patient and by the location of the infection. Symptoms in young women typically include a frequent and intense urge to urinate and a painful, burning feeling in the bladder or urethra during urination. Older women and men are more likely to be tired, shaky, and weak and have muscle aches and abdominal pain. A fever may mean the infection is in your kidneys. Other symptoms of a kidney infection include pain in your back or sides below the ribs, nausea, and vomiting. DIAGNOSIS To diagnose a UTI, your caregiver will ask you about your symptoms. Your caregiver also will ask to provide a urine sample. The urine sample will be tested for bacteria and white blood cells. White blood cells are made by your body to help fight infection. TREATMENT  Typically, UTIs can be treated with medication. Because most UTIs are caused by a bacterial infection, they usually can be treated with the use of antibiotics. The choice of antibiotic and length of treatment depend on your symptoms and the type of bacteria causing your infection. HOME CARE INSTRUCTIONS  If you were prescribed antibiotics, take them exactly as your caregiver instructs you. Finish the medication even if you feel Erickson after  you have only taken some of the medication.  Drink enough water and fluids to keep your urine clear or pale yellow.  Avoid caffeine, tea, and carbonated beverages. They tend to irritate your bladder.  Empty your bladder often. Avoid holding urine for long periods of time.  Empty your bladder before and after sexual intercourse.  After a bowel movement, women should cleanse from front to back. Use each tissue only once. SEEK MEDICAL CARE IF:   You have back pain.  You develop a fever.  Your symptoms do not begin to resolve within 3 days. SEEK IMMEDIATE MEDICAL CARE IF:   You have severe back pain or lower abdominal pain.  You  develop chills.  You have nausea or vomiting.  You have continued burning or discomfort with urination. MAKE SURE YOU:   Understand these instructions.  Will watch your condition.  Will get help right away if you are not doing well or get worse. Document Released: 09/04/2005 Document Revised: 05/26/2012 Document Reviewed: 01/03/2012 [facility] Patient Information 2015 [place], [place]. This information is not intended to replace advice given to you by your health care provider. Make sure you discuss any questions you have with your health care provider.      HOPPER,DAVID, MD 06/08/2015

## 2015-06-11 LAB — URINE CULTURE: Colony Count: >=100000

## 2015-06-17 ENCOUNTER — Other Ambulatory Visit: Payer: Self-pay | Admitting: Family Medicine

## 2015-06-17 MED ORDER — NITROFURANTOIN MONOHYD MACRO 100 MG PO CAPS: 100.0000 mg | ORAL_CAPSULE | Freq: Two times a day (BID) | ORAL | Status: DC

## 2015-06-20 ENCOUNTER — Other Ambulatory Visit: Payer: Self-pay

## 2015-06-20 ENCOUNTER — Telehealth: Payer: Self-pay | Admitting: *Deleted

## 2015-06-20 DIAGNOSIS — Z1231 Encounter for screening mammogram for malignant neoplasm of breast: Secondary | ICD-10-CM

## 2015-06-20 NOTE — Telephone Encounter (Signed)
Pt was treated for UTI at PCP office, states infection seems no better. Pt said she is having lower back discomfort, swollen glands that is tender to touch. Pt asked if she could have OV with provider here, I advised pt this was fine and transferred to front desk.

## 2015-06-21 ENCOUNTER — Ambulatory Visit (INDEPENDENT_AMBULATORY_CARE_PROVIDER_SITE_OTHER): Payer: 59 | Admitting: Gynecology

## 2015-06-21 ENCOUNTER — Encounter: Payer: Self-pay | Admitting: Gynecology

## 2015-06-21 VITALS — BP 118/80 | Ht 66.0 in | Wt 170.0 lb

## 2015-06-21 DIAGNOSIS — N3000 Acute cystitis without hematuria: Secondary | ICD-10-CM | POA: Diagnosis not present

## 2015-06-21 LAB — URINALYSIS W MICROSCOPIC + REFLEX CULTURE
Bilirubin Urine: NEGATIVE
Glucose, UA: NEGATIVE mg/dL
Hgb urine dipstick: NEGATIVE
Ketones, ur: NEGATIVE mg/dL
Leukocytes, UA: NEGATIVE
Nitrite: NEGATIVE
Protein, ur: NEGATIVE mg/dL
Specific Gravity, Urine: 1.015 (ref 1.005–1.030)
Urobilinogen, UA: 0.2 mg/dL (ref 0.0–1.0)
pH: 5.5 (ref 5.0–8.0)

## 2015-06-21 NOTE — Addendum Note (Signed)
Addended by: Thurnell Garbe A on: 06/21/2015 04:16 PM   Modules accepted: Orders, SmartSet

## 2015-06-21 NOTE — Progress Notes (Signed)
   Patient is a 44 year old presented to the office to discuss her recent treatment for urinary tract infection. Patient stays and on June 30 she presented to an urgent care patient is having dysuria frequency and back discomfort. Has a weighted for urine culture she was started on Septra DS 1 by mouth twice a day for 7 days. She completed the treatment and she was notified later that this anabolic was resistant and was switched to Butler. Microorganism identified was Escherichia coli. She is currently been on it for 5 days. She has some mild right flank discomfort but no dysuria, frequency, nausea, vomiting, fever, or chills.  Exam: Blood pressure 118/80 Gen. appearance: Well developed well nourished female in no acute distress Back: Mild right CVA tenderness Abdomen: Soft nontender no rebound or guarding Pelvic: Bartholin urethra Skene was within normal limits Vagina: No lesions or discharge Cervix: No lesions or discharge Uterus anteverted normal size shape and consistency Adnexa: No palpable masses or tenderness Rectal exam not done  Urinalysis negative  Assessment/plan: Patient almost completed her therapy for urinary tract infection after anabolic was changed as a result of sensitivity. Patient is to be doing well. Patient started to continue her fluid intake and complete her anabolic treatment. If she continues any back discomfort she'll return back to the office. Patient otherwise scheduled to return to the office in a year for annual exam or when necessary.

## 2015-07-05 ENCOUNTER — Encounter: Payer: Self-pay | Admitting: Internal Medicine

## 2015-07-05 ENCOUNTER — Ambulatory Visit (HOSPITAL_COMMUNITY)
Admission: RE | Admit: 2015-07-05 | Discharge: 2015-07-05 | Disposition: A | Discharge status: Home / Self Care | Payer: 59 | Source: Ambulatory Visit | Attending: Family Medicine | Admitting: Family Medicine

## 2015-07-05 ENCOUNTER — Ambulatory Visit (INDEPENDENT_AMBULATORY_CARE_PROVIDER_SITE_OTHER): Payer: 59 | Admitting: Internal Medicine

## 2015-07-05 VITALS — BP 135/76 | HR 86 | Temp 98.2°F | Wt 176.5 lb

## 2015-07-05 DIAGNOSIS — Z0000 Encounter for general adult medical examination without abnormal findings: Secondary | ICD-10-CM | POA: Insufficient documentation

## 2015-07-05 DIAGNOSIS — R002 Palpitations: Secondary | ICD-10-CM | POA: Insufficient documentation

## 2015-07-05 DIAGNOSIS — R0789 Other chest pain: Secondary | ICD-10-CM

## 2015-07-05 DIAGNOSIS — M542 Cervicalgia: Secondary | ICD-10-CM | POA: Diagnosis not present

## 2015-07-05 LAB — COMPLETE METABOLIC PANEL WITH GFR
ALT: 18 U/L (ref 6–29)
AST: 17 U/L (ref 10–35)
Albumin: 4.3 g/dL (ref 3.6–5.1)
Alkaline Phosphatase: 65 U/L (ref 33–115)
BUN: 15 mg/dL (ref 7–25)
CO2: 22 mEq/L (ref 20–31)
Calcium: 9 mg/dL (ref 8.6–10.2)
Chloride: 106 mEq/L (ref 98–110)
Creat: 0.62 mg/dL (ref 0.50–1.10)
GFR, Est African American: >89 mL/min (ref >=60)
GFR, Est Non African American: >89 mL/min (ref >=60)
Glucose, Bld: 82 mg/dL (ref 65–99)
Potassium: 4.1 mEq/L (ref 3.5–5.3)
Sodium: 139 mEq/L (ref 135–146)
Total Bilirubin: 0.4 mg/dL (ref 0.2–1.2)
Total Protein: 6.7 g/dL (ref 6.1–8.1)

## 2015-07-05 LAB — CBC WITH DIFFERENTIAL/PLATELET
Basophils Absolute: 0 10*3/uL (ref 0.0–0.1)
Basophils Relative: 0 % (ref 0–1)
Eosinophils Absolute: 0.1 10*3/uL (ref 0.0–0.7)
Eosinophils Relative: 1 % (ref 0–5)
HCT: 42.3 % (ref 36.0–46.0)
Hemoglobin: 14.1 g/dL (ref 12.0–15.0)
Lymphocytes Relative: 17 % (ref 12–46)
Lymphs Abs: 1.6 10*3/uL (ref 0.7–4.0)
MCH: 30.3 pg (ref 26.0–34.0)
MCHC: 33.3 g/dL (ref 30.0–36.0)
MCV: 91 fL (ref 78.0–100.0)
MPV: 8.8 fL (ref 8.6–12.4)
Monocytes Absolute: 0.5 10*3/uL (ref 0.1–1.0)
Monocytes Relative: 5 % (ref 3–12)
Neutro Abs: 7.2 10*3/uL (ref 1.7–7.7)
Neutrophils Relative %: 77 % (ref 43–77)
Platelets: 280 10*3/uL (ref 150–400)
RBC: 4.65 MIL/uL (ref 3.87–5.11)
RDW: 13.6 % (ref 11.5–15.5)
WBC: 9.4 10*3/uL (ref 4.0–10.5)

## 2015-07-05 LAB — MAGNESIUM: Magnesium: 2.1 mg/dL (ref 1.5–2.5)

## 2015-07-05 LAB — TSH: TSH: 0.835 u[IU]/mL (ref 0.350–4.500)

## 2015-07-05 LAB — TROPONIN I: Troponin I: <0.03 ng/mL (ref <0.031)

## 2015-07-05 MED ORDER — IBUPROFEN 600 MG PO TABS: 600.0000 mg | ORAL_TABLET | Freq: Four times a day (QID) | ORAL | Status: DC | PRN

## 2015-07-05 MED ORDER — CYCLOBENZAPRINE HCL 5 MG PO TABS: 5.0000 mg | ORAL_TABLET | Freq: Three times a day (TID) | ORAL | Status: DC | PRN

## 2015-07-05 NOTE — Assessment & Plan Note (Signed)
Assessment: Pt with 1-2 week history of left sided neck pain without preceding injury/trauma/strain who presents with persistent pain most likely due to muscle spasm.   Plan:  -Prescribe ibuprofen 600 mg Q 6 hr PRN pain/inflammation  -Prescribe flexeril 5 mg TID PRN muscle spasms (pt instructed to not operate machinery when using this medication) -Pt instructed to use heating pad to neck  -No indication for imaging at this time -Pt instructed to return if symptoms do not improve

## 2015-07-05 NOTE — Progress Notes (Signed)
Patient ID: Patricia Erickson, female   DOB: November 09, 1971, 43 y.o.   MRN: [id number]    Subjective:   Patient ID: Patricia Erickson female   DOB: [birth year] 44 y.o.   MRN: [id number]  HPI: [name] is a 44 y.o. very pleasant woman with migraine headaches, history of colon polyps, dog bite to right LE with cellulitis, and IUD who presents with chief complaint of chest heaviness, palpitations, and left sided neck pain.   She reports feeling chest heaviness that started one week ago with intermittent palpitations (<10 times per day). She reports checking her pulse at home with heart rate 80-110 above her normal heart rate in 60's. She denies recent heaving lifting, chest wall tenderness, dyspnea, lightheadedness, syncope, cough, wheezing, diaphoresis, jaw pain, fatigue, nausea, vomiting, acid reflux symptoms, asthma, anxiety, recent illness, or drug use. She used to smoke but quit about 20 years ago. She has no prior cardiac history. Her father had MI at approx age 71. Her last TSH level on 12/215 was normal.   She also reports left sided neck pain without preceding trauma, injury, or strain that started before the chest heaviness. She has no past history of neck pain. She denies headache, vision change, neck stiffness, UE weakness, or UE paraesthesias. She has not used any medications for pain relief. She has used heating pad with mild relief.     Past Medical History  Diagnosis Date  . Polyp of colon 03/11/2007    BENIGN  . Anxiety   . Migraines    Current Outpatient Prescriptions  Medication Sig Dispense Refill  . chlorproMAZINE (THORAZINE) 25 MG tablet Take 25 mg by mouth as needed (for migraines).     . gabapentin (NEURONTIN) 600 MG tablet Take 1 tablet by mouth 2 (two) times daily.    Marland Kitchen HYDROcodone-acetaminophen (NORCO) 10-325 MG per tablet Take 1 tablet by mouth every 6 (six) hours as needed.    Marland Kitchen levonorgestrel (MIRENA) 20 MCG/24HR IUD 1 each by Intrauterine route once. INSERTED 05/06/2007      . nabumetone (RELAFEN) 750 MG tablet Take 750 mg by mouth daily as needed (for migraines).     . nitrofurantoin, macrocrystal-monohydrate, (MACROBID) 100 MG capsule Take 1 capsule (100 mg total) by mouth 2 (two) times daily. 14 capsule 0  . topiramate (TOPAMAX) 200 MG tablet Take 200 mg by mouth at bedtime.     No current facility-administered medications for this visit.   Family History  Problem Relation Age of Onset  . Cancer Mother     COLON  . Osteoporosis Mother   . Breast cancer Mother 34  . Hypertension Father   . Osteoporosis Maternal Grandmother    History   Social History  . Marital Status: Divorced    Spouse Name: N/A  . Number of Children: N/A  . Years of Education: N/A   Social History Main Topics  . Smoking status: Former Smoker    Quit date: 10/12/1992  . Smokeless tobacco: Never Used  . Alcohol Use: 0.0 oz/week    0 Standard drinks or equivalent per week     Comment: OCCASIONALLY  . Drug Use: No  . Sexual Activity: Yes    Birth Control/ Protection: IUD     Comment: Mirena 10/29/2012   Other Topics Concern  . Not on file   Social History Narrative   Review of Systems: Review of Systems  Constitutional: Negative for fever, chills and malaise/fatigue.  Eyes: Negative for blurred vision.  Respiratory: Negative  for cough, shortness of breath and wheezing.   Cardiovascular: Positive for chest pain (chest heaviness) and palpitations (intermittent <10 per day ). Negative for leg swelling.  Gastrointestinal: Negative for heartburn, nausea, vomiting, abdominal pain, diarrhea, constipation and blood in stool.  Genitourinary: Negative for dysuria, urgency, frequency and hematuria.  Musculoskeletal: Positive for neck pain. Negative for myalgias and falls.  Skin: Negative for rash (resolved right LE cellultiis ).  Neurological: Positive for headaches (chronic migraine headaches). Negative for dizziness, sensory change, focal weakness and loss of consciousness.    Psychiatric/Behavioral: Negative for substance abuse. The patient is not nervous/anxious.      Objective:  Physical Exam: Filed Vitals:   07/05/15 1649  BP: 135/76  Pulse: 86  Temp: 98.2 F (36.8 C)  TempSrc: Oral  Weight: 176 lb 8 oz (80.06 kg)  SpO2: 100%    Physical Exam  Constitutional: She is oriented to person, place, and time. She appears well-developed and well-nourished. No distress.  HENT:  Head: Normocephalic and atraumatic.  Right Ear: External ear normal.  Left Ear: External ear normal.  Nose: Nose normal.  Mouth/Throat: Oropharynx is clear and moist. No oropharyngeal exudate.  Eyes: Conjunctivae and EOM are normal. Pupils are equal, round, and reactive to light. Right eye exhibits no discharge. Left eye exhibits no discharge. No scleral icterus.  Neck: Normal range of motion. Neck supple.  Mild left paraspinal tension with no tenderness   Cardiovascular: Normal rate, regular rhythm and normal heart sounds.   No murmur heard. Pulmonary/Chest: Effort normal and breath sounds normal. No respiratory distress. She has no wheezes. She has no rales.  Abdominal: Soft. Bowel sounds are normal. She exhibits no distension. There is no tenderness. There is no rebound and no guarding.  Musculoskeletal: Normal range of motion. She exhibits edema (trace b/l LE). She exhibits no tenderness.  Neurological: She is alert and oriented to person, place, and time.  Skin: Skin is warm and dry. No rash noted. She is not diaphoretic. No erythema. No pallor.  Psychiatric: She has a normal mood and affect. Her behavior is normal. Judgment and thought content normal.    Assessment & Plan:   Please see problem list for problem-based assessment and plan

## 2015-07-05 NOTE — Patient Instructions (Addendum)
-  Will check your bloodwork and do an EKG today. I will call you with your lab results -For neck pain, take ibuprofen 600 mg every 6 hrs for pain and place a heating pad on your neck  -Take flexeril 5 mg three times daily for muscle spasm in your neck  -Very nice meeting you, please come back if the pain, palpitations, and chest heaviness do not get better   General Instructions:   Please bring your medicines with you each time you come to clinic.  Medicines may include prescription medications, over-the-counter medications, herbal remedies, eye drops, vitamins, or other pills.   Progress Toward Treatment Goals:  No flowsheet data found.  Self Care Goals & Plans:  No flowsheet data found.  No flowsheet data found.   Care Management & Community Referrals:  No flowsheet data found.

## 2015-07-05 NOTE — Assessment & Plan Note (Addendum)
Assessment: Pt with 1-week history of chest heaviness with no prior cardiac history who presents with probable non-cardiac chest pain possibly due to anxiety, GERD, or uncapturable arrhythmia.    Plan:  -Obtain stat 12-lead EKG ----> normal  -Obtain stat troponin ---> normal -Obtain TSH, CMP, CBC w/diff, and magnesium level  -No indication for chest xray at this time -Consider starting empiric PPI for GERD if chest pressure persists  -Consider anti-anxiety medication for anxiety if other causes excluded -Consider holter monitor to assess for arrhythmia if palpitations persist -Pt instructed to return if symptoms do not improve

## 2015-07-05 NOTE — Assessment & Plan Note (Addendum)
Assessment: Pt with 1-week history of intermittent palpitations with no prior history who presents with normal heart rate (86) and normal sinus rhythm possibly due to anxiety vs uncapturable arrhythmia.   Plan:  -Obtain stat 12-lead EKG ----> normal  -Obtain stat troponin ---> normal -Obtain TSH, CMP, CBC w/diff, and magnesium level  -Consider anti-anxiety medication for anxiety if other causes excluded -Consider holter monitor to assess for arrhythmia if palpitations persist -Pt instructed to return if symptoms do not improve

## 2015-07-05 NOTE — Assessment & Plan Note (Signed)
Obtain screening HIV Ab

## 2015-07-06 LAB — HIV ANTIBODY (ROUTINE TESTING W REFLEX): HIV 1&2 Ab, 4th Generation: NONREACTIVE

## 2015-07-06 NOTE — Progress Notes (Signed)
Medicine attending: Medical history, presenting problems, physical findings, and medications, reviewed with Dr Juluis Mire and I concur with her evaluation and management plan. No cardiac risk factors. Normal EKG. I do not believe her symptoms are ischemic cardiac in nature. If symptoms persist, we will get a Holter monitor to rule out arrhythmia.

## 2015-07-24 ENCOUNTER — Ambulatory Visit
Admission: RE | Admit: 2015-07-24 | Discharge: 2015-07-24 | Disposition: A | Discharge status: Home / Self Care | Payer: 59 | Source: Ambulatory Visit

## 2015-07-24 DIAGNOSIS — Z1231 Encounter for screening mammogram for malignant neoplasm of breast: Secondary | ICD-10-CM

## 2015-08-02 ENCOUNTER — Telehealth: Payer: Self-pay | Admitting: *Deleted

## 2015-08-02 MED ORDER — FLUCONAZOLE 150 MG PO TABS: 150.0000 mg | ORAL_TABLET | Freq: Once | ORAL | Status: DC

## 2015-08-02 NOTE — Telephone Encounter (Signed)
Diflucan 150 mg one tablet

## 2015-08-02 NOTE — Telephone Encounter (Signed)
Pt aware Rx sent.  

## 2015-08-02 NOTE — Telephone Encounter (Signed)
Pt called c/o yeast infection itching and white discharge. Is working all this week 7a-7pm, has yeast due to taking augumentin prescribed by other provider. Pt said OTC doesn't work and was hoping you would send a Rx in for her? I did explain to pt OV for infections, but unable due to work. Pt asked me to relay to you for answer. Please advise

## 2015-08-08 ENCOUNTER — Telehealth: Payer: Self-pay | Admitting: *Deleted

## 2015-08-08 DIAGNOSIS — M542 Cervicalgia: Secondary | ICD-10-CM

## 2015-08-09 NOTE — Telephone Encounter (Signed)
Patient prescribed flexeril 07/05/2015 for muscle spasm. She will need to be seen in clinic for reevaluation if she is still requiring the medication.  Jacques Earthly, MD

## 2015-08-16 NOTE — Telephone Encounter (Signed)
Patient states she did not request the refill and does not need this medication.

## 2015-08-17 ENCOUNTER — Encounter: Payer: 59 | Admitting: Pulmonary Disease

## 2015-10-06 ENCOUNTER — Other Ambulatory Visit: Payer: Self-pay | Admitting: Gastroenterology

## 2015-10-06 DIAGNOSIS — R1033 Periumbilical pain: Secondary | ICD-10-CM

## 2015-10-11 ENCOUNTER — Ambulatory Visit
Admission: RE | Admit: 2015-10-11 | Discharge: 2015-10-11 | Disposition: A | Discharge status: Home / Self Care | Payer: 59 | Source: Ambulatory Visit | Attending: Gastroenterology | Admitting: Gastroenterology

## 2015-10-11 DIAGNOSIS — R1033 Periumbilical pain: Secondary | ICD-10-CM

## 2015-10-11 MED ORDER — IOPAMIDOL (ISOVUE-300) INJECTION 61%
100.0000 mL | Freq: Once | INTRAVENOUS | Status: AC | PRN
  Administered 2015-10-11: 100 mL via INTRAVENOUS

## 2015-10-17 ENCOUNTER — Emergency Department (HOSPITAL_COMMUNITY): Payer: 59

## 2015-10-17 ENCOUNTER — Encounter (HOSPITAL_COMMUNITY): Payer: Self-pay

## 2015-10-17 ENCOUNTER — Observation Stay (HOSPITAL_COMMUNITY)
Admission: EM | Admit: 2015-10-17 | Discharge: 2015-10-19 | Disposition: A | Discharge status: Home / Self Care | Payer: 59 | Attending: General Surgery | Admitting: General Surgery

## 2015-10-17 DIAGNOSIS — Z8719 Personal history of other diseases of the digestive system: Secondary | ICD-10-CM | POA: Diagnosis present

## 2015-10-17 DIAGNOSIS — K8010 Calculus of gallbladder with chronic cholecystitis without obstruction: Secondary | ICD-10-CM | POA: Diagnosis not present

## 2015-10-17 DIAGNOSIS — Z87891 Personal history of nicotine dependence: Secondary | ICD-10-CM | POA: Insufficient documentation

## 2015-10-17 DIAGNOSIS — F419 Anxiety disorder, unspecified: Secondary | ICD-10-CM | POA: Diagnosis not present

## 2015-10-17 DIAGNOSIS — R1011 Right upper quadrant pain: Secondary | ICD-10-CM | POA: Diagnosis present

## 2015-10-17 DIAGNOSIS — K8020 Calculus of gallbladder without cholecystitis without obstruction: Secondary | ICD-10-CM | POA: Diagnosis present

## 2015-10-17 DIAGNOSIS — R110 Nausea: Secondary | ICD-10-CM | POA: Diagnosis present

## 2015-10-17 DIAGNOSIS — R109 Unspecified abdominal pain: Secondary | ICD-10-CM | POA: Diagnosis present

## 2015-10-17 LAB — URINALYSIS, ROUTINE W REFLEX MICROSCOPIC
Bilirubin Urine: NEGATIVE
Glucose, UA: NEGATIVE mg/dL
Hgb urine dipstick: NEGATIVE
Ketones, ur: 15 mg/dL — AB
Leukocytes, UA: NEGATIVE
Nitrite: NEGATIVE
Protein, ur: NEGATIVE mg/dL
Specific Gravity, Urine: 1.021 (ref 1.005–1.030)
Urobilinogen, UA: 0.2 mg/dL (ref 0.0–1.0)
pH: 5.5 (ref 5.0–8.0)

## 2015-10-17 LAB — DIFFERENTIAL
Basophils Absolute: 0 10*3/uL (ref 0.0–0.1)
Basophils Relative: 0 %
Eosinophils Absolute: 0.1 10*3/uL (ref 0.0–0.7)
Eosinophils Relative: 1 %
Lymphocytes Relative: 30 %
Lymphs Abs: 2.2 10*3/uL (ref 0.7–4.0)
Monocytes Absolute: 0.4 10*3/uL (ref 0.1–1.0)
Monocytes Relative: 5 %
Neutro Abs: 4.7 10*3/uL (ref 1.7–7.7)
Neutrophils Relative %: 64 %

## 2015-10-17 LAB — COMPREHENSIVE METABOLIC PANEL
ALT: 18 U/L (ref 14–54)
AST: 17 U/L (ref 15–41)
Albumin: 4 g/dL (ref 3.5–5.0)
Alkaline Phosphatase: 58 U/L (ref 38–126)
Anion gap: 8 (ref 5–15)
BUN: 8 mg/dL (ref 6–20)
CO2: 18 mmol/L — ABNORMAL LOW (ref 22–32)
Calcium: 8.7 mg/dL — ABNORMAL LOW (ref 8.9–10.3)
Chloride: 112 mmol/L — ABNORMAL HIGH (ref 101–111)
Creatinine, Ser: 0.73 mg/dL (ref 0.44–1.00)
GFR calc Af Amer: >60 mL/min (ref >60)
GFR calc non Af Amer: >60 mL/min (ref >60)
Glucose, Bld: 100 mg/dL — ABNORMAL HIGH (ref 65–99)
Potassium: 3.7 mmol/L (ref 3.5–5.1)
Sodium: 138 mmol/L (ref 135–145)
Total Bilirubin: 0.7 mg/dL (ref 0.3–1.2)
Total Protein: 6.3 g/dL — ABNORMAL LOW (ref 6.5–8.1)

## 2015-10-17 LAB — CBC
HCT: 42 % (ref 36.0–46.0)
Hemoglobin: 14.2 g/dL (ref 12.0–15.0)
MCH: 29.9 pg (ref 26.0–34.0)
MCHC: 33.8 g/dL (ref 30.0–36.0)
MCV: 88.4 fL (ref 78.0–100.0)
Platelets: 251 10*3/uL (ref 150–400)
RBC: 4.75 MIL/uL (ref 3.87–5.11)
RDW: 12.6 % (ref 11.5–15.5)
WBC: 7.4 10*3/uL (ref 4.0–10.5)

## 2015-10-17 LAB — SURGICAL PCR SCREEN
MRSA, PCR: NEGATIVE
Staphylococcus aureus: NEGATIVE

## 2015-10-17 LAB — LIPASE, BLOOD: Lipase: 24 U/L (ref 11–51)

## 2015-10-17 MED ORDER — CYCLOBENZAPRINE HCL 5 MG PO TABS: 5.0000 mg | ORAL_TABLET | Freq: Three times a day (TID) | ORAL | Status: DC | PRN

## 2015-10-17 MED ORDER — ONDANSETRON HCL 4 MG/2ML IJ SOLN
4.0000 mg | Freq: Once | INTRAMUSCULAR | Status: DC | PRN
  Administered 2015-10-17: 4 mg via INTRAVENOUS
  Filled 2015-10-17 (×2): qty 2

## 2015-10-17 MED ORDER — DEXTROSE 5 % IV SOLN
2.0000 g | INTRAVENOUS | Status: DC
  Filled 2015-10-17 (×2): qty 2

## 2015-10-17 MED ORDER — ONDANSETRON 4 MG PO TBDP: 4.0000 mg | ORAL_TABLET | Freq: Four times a day (QID) | ORAL | Status: DC | PRN

## 2015-10-17 MED ORDER — TOPIRAMATE 100 MG PO TABS
200.0000 mg | ORAL_TABLET | Freq: Every day | ORAL | Status: DC
  Administered 2015-10-17 – 2015-10-19 (×2): 200 mg via ORAL
  Filled 2015-10-17 (×3): qty 2

## 2015-10-17 MED ORDER — DEXTROSE-NACL 5-0.9 % IV SOLN
INTRAVENOUS | Status: DC
  Administered 2015-10-18: 01:00:00 via INTRAVENOUS

## 2015-10-17 MED ORDER — ONDANSETRON HCL 4 MG/2ML IJ SOLN
4.0000 mg | Freq: Once | INTRAMUSCULAR | Status: AC
  Administered 2015-10-17: 4 mg via INTRAVENOUS
  Filled 2015-10-17: qty 2

## 2015-10-17 MED ORDER — HYDROMORPHONE HCL 1 MG/ML IJ SOLN
1.0000 mg | INTRAMUSCULAR | Status: DC | PRN
  Administered 2015-10-17 – 2015-10-18 (×6): 1 mg via INTRAVENOUS
  Filled 2015-10-17 (×6): qty 1

## 2015-10-17 MED ORDER — IRBESARTAN 75 MG PO TABS
75.0000 mg | ORAL_TABLET | Freq: Every day | ORAL | Status: DC
Start: 2015-10-18 — End: 2015-10-18
  Filled 2015-10-17: qty 1

## 2015-10-17 MED ORDER — ENOXAPARIN SODIUM 40 MG/0.4ML ~~LOC~~ SOLN
40.0000 mg | SUBCUTANEOUS | Status: DC
  Administered 2015-10-17 – 2015-10-18 (×2): 40 mg via SUBCUTANEOUS
  Filled 2015-10-17 (×2): qty 0.4

## 2015-10-17 MED ORDER — HYDROMORPHONE HCL 1 MG/ML IJ SOLN
1.0000 mg | Freq: Once | INTRAMUSCULAR | Status: AC
  Administered 2015-10-17: 1 mg via INTRAVENOUS
  Filled 2015-10-17: qty 1

## 2015-10-17 MED ORDER — HYDROMORPHONE HCL 1 MG/ML IJ SOLN
2.0000 mg | Freq: Once | INTRAMUSCULAR | Status: AC
  Administered 2015-10-17: 2 mg via INTRAVENOUS
  Filled 2015-10-17: qty 2

## 2015-10-17 MED ORDER — DEXTROSE 5 % IV SOLN
2.0000 g | INTRAVENOUS | Status: DC
  Filled 2015-10-17: qty 2

## 2015-10-17 MED ORDER — OXYCODONE HCL 5 MG PO TABS
5.0000 mg | ORAL_TABLET | ORAL | Status: DC | PRN
  Administered 2015-10-17: 10 mg via ORAL
  Filled 2015-10-17: qty 2

## 2015-10-17 MED ORDER — CHLORPROMAZINE HCL 25 MG PO TABS
25.0000 mg | ORAL_TABLET | ORAL | Status: DC | PRN
  Filled 2015-10-17: qty 1

## 2015-10-17 MED ORDER — PANTOPRAZOLE SODIUM 40 MG IV SOLR
40.0000 mg | Freq: Once | INTRAVENOUS | Status: AC
  Administered 2015-10-17: 40 mg via INTRAVENOUS
  Filled 2015-10-17: qty 40

## 2015-10-17 MED ORDER — ONDANSETRON HCL 4 MG/2ML IJ SOLN
4.0000 mg | Freq: Four times a day (QID) | INTRAMUSCULAR | Status: DC | PRN
  Administered 2015-10-18 (×3): 4 mg via INTRAVENOUS
  Filled 2015-10-17 (×2): qty 2

## 2015-10-17 MED ORDER — SODIUM CHLORIDE 0.9 % IV BOLUS (SEPSIS)
1000.0000 mL | Freq: Once | INTRAVENOUS | Status: AC
  Administered 2015-10-17: 1000 mL via INTRAVENOUS

## 2015-10-17 NOTE — ED Notes (Signed)
General surgery PA  at bedside,

## 2015-10-17 NOTE — ED Notes (Signed)
Pt reports abdominal pain with nausea since October 1st. Pt scheduled to have choleycstecomy but states pain and nausea increased so much she was told to come in. Denies fever. Denies emesis and diarrhea.

## 2015-10-17 NOTE — Progress Notes (Signed)
Pt admitted from ED this evening. Scheduled for a cholecystectomy tomorrow. Alert, oriented and able to voice needs. No concerns expressed at this time

## 2015-10-17 NOTE — H&P (Signed)
Patricia Erickson [birth year]  [id number].   GI doctor: Dr. Juanita Craver  Chief Complaint/Reason for Consult: gallstones HPI: This is a 44 yo white female who states in early October she started having some diffuse abdominal pain that was greatest in the LUQ.  She has had some nausea, but no emesis.  She has had generalized malaise, but no definitive fevers.  She states initially her pain improved after eating, but she hasn't eat much recently due to nausea, so she's not sure if eating makes her pain better or worse anymore.  She had diarrhea for 2 days initially, but then returned to normal.  Last week, she then had constipation, but denies blood in her stools.  She denies reflux like symptoms, bloating, but occasional belching.  She saw her GI doctor last week and was sent for a CT scan that revealed 2 gallstones.  She was referred to see Dr. Georgette Dover as an outpatient, but her symptoms have continued to worsen and so she presented to the [facility] today for evaluation.  Her labs are normal and her [place] is normal except for these 2 gallstones, seen previously on her CT scan.  We have been asked to see her for further evaluation.  ROS : Please see HPI, otherwise all other systems are negative.  CT does show tiny left kidney stone, but denies back pain, pain in left groin, dysuria, or hematuria.  Family History  Problem Relation Age of Onset  . Cancer Mother     COLON  . Osteoporosis Mother   . Breast cancer Mother 63  . Hypertension Father   . Osteoporosis Maternal Grandmother     Past Medical History  Diagnosis Date  . Polyp of colon 03/11/2007    BENIGN  . Anxiety   . Migraines     Past Surgical History  Procedure Laterality Date  . Cesarean section  2000    Social History:  reports that she quit smoking about 23 years ago. She has never used smokeless tobacco. She reports that she drinks alcohol. She reports that she does not use illicit drugs.  Allergies:  Allergies  Allergen Reactions  .  Propoxyphene N-Acetaminophen Nausea And Vomiting     (Not in a hospital admission)  Blood pressure 122/66, pulse 83, temperature 99 F (37.2 C), temperature source Oral, resp. rate 18, SpO2 99 %. Physical Exam: General: pleasant, WD, WN white female who is laying in bed in NAD HEENT: head is normocephalic, atraumatic.  Sclera are noninjected.  PERRL.  Ears and nose without any masses or lesions.  Mouth is pink and moist Heart: regular, rate, and rhythm.  Normal s1,s2. No obvious murmurs, gallops, or rubs noted.  Palpable radial and pedal pulses bilaterally Lungs: CTAB, no wheezes, rhonchi, or rales noted.  Respiratory effort nonlabored Abd: soft, mild diffuse tenderness, great in LUQ, but no guarding, rebounding, ND, +BS, no masses, hernias, or organomegaly MS: all 4 extremities are symmetrical with no cyanosis, clubbing, or edema. Skin: warm and dry with no masses, lesions, or rashes Psych: A&Ox3 with an appropriate affect.    Results for orders placed or performed during the hospital encounter of 10/17/15 (from the past 48 hour(s))  Lipase, blood     Status: None   Collection Time: 10/17/15 12:32 PM  Result Value Ref Range   Lipase 24 11 - 51 U/L  Comprehensive metabolic panel     Status: Abnormal   Collection Time: 10/17/15 12:32 PM  Result Value Ref Range   Sodium  138 135 - 145 mmol/L   Potassium 3.7 3.5 - 5.1 mmol/L   Chloride 112 (H) 101 - 111 mmol/L   CO2 18 (L) 22 - 32 mmol/L   Glucose, Bld 100 (H) 65 - 99 mg/dL   BUN 8 6 - 20 mg/dL   Creatinine, Ser 0.73 0.44 - 1.00 mg/dL   Calcium 8.7 (L) 8.9 - 10.3 mg/dL   Total Protein 6.3 (L) 6.5 - 8.1 g/dL   Albumin 4.0 3.5 - 5.0 g/dL   AST 17 15 - 41 U/L   ALT 18 14 - 54 U/L   Alkaline Phosphatase 58 38 - 126 U/L   Total Bilirubin 0.7 0.3 - 1.2 mg/dL   GFR calc non Af Amer >60 >60 mL/min   GFR calc Af Amer >60 >60 mL/min    Comment: (NOTE) The eGFR has been calculated using the CKD EPI equation. This calculation has not  been validated in all clinical situations. eGFR's persistently <60 mL/min signify possible Chronic Kidney Disease.    Anion gap 8 5 - 15  CBC     Status: None   Collection Time: 10/17/15 12:32 PM  Result Value Ref Range   WBC 7.4 4.0 - 10.5 K/uL   RBC 4.75 3.87 - 5.11 MIL/uL   Hemoglobin 14.2 12.0 - 15.0 g/dL   HCT 42.0 36.0 - 46.0 %   MCV 88.4 78.0 - 100.0 fL   MCH 29.9 26.0 - 34.0 pg   MCHC 33.8 30.0 - 36.0 g/dL   RDW 12.6 11.5 - 15.5 %   Platelets 251 150 - 400 K/uL  Differential     Status: None   Collection Time: 10/17/15 12:32 PM  Result Value Ref Range   Neutrophils Relative % 64 %   Neutro Abs 4.7 1.7 - 7.7 K/uL   Lymphocytes Relative 30 %   Lymphs Abs 2.2 0.7 - 4.0 K/uL   Monocytes Relative 5 %   Monocytes Absolute 0.4 0.1 - 1.0 K/uL   Eosinophils Relative 1 %   Eosinophils Absolute 0.1 0.0 - 0.7 K/uL   Basophils Relative 0 %   Basophils Absolute 0.0 0.0 - 0.1 K/uL   US Abdomen Limited  10/17/2015  CLINICAL DATA:  Suspected cholecystitis.  Follow-up CT. EXAM: US ABDOMEN LIMITED - RIGHT UPPER QUADRANT COMPARISON:  CT abdomen and pelvis dated 10/11/2015. FINDINGS: Gallbladder: Two gallstones are identified within the gallbladder, measuring 1.6 and 1.3 cm respectively. There is no gallbladder wall thickening, pericholecystic fluid or other secondary signs of acute cholecystitis. Common bile duct: Diameter: Normal at 4 mm. No bile duct stone identified. No intrahepatic bile duct dilatation. Liver: No focal lesion identified. Within normal limits in parenchymal echogenicity. No free fluid identified in the right upper quadrant. IMPRESSION: 1. Cholelithiasis without evidence of acute cholecystitis. 2. No acute findings. Electronically Signed   By: Franki Cabot M.D.   On: 10/17/2015 14:31       Assessment/Plan 1. Abdominal pain, cholelithiasis -Dr. Kieth Brightly has evaluated the patient with me.  We will get her admitted and plan to remove her gallbladder this admission.  Her  story is not classic for biliary colic and so there is a chance that surgery won't resolve her symptoms, but after discussion, this is how she would like to proceed.   -clear liquids tonight and NPO p MN -no abx needed currently, will give dose on call to OR tomorrow -IVFs, pain meds, nausea meds prn   OSBORNE,KELLY E 10/17/2015, 4:03 PM Pager: [phone]

## 2015-10-17 NOTE — ED Notes (Signed)
Pt placed in gown and in bed. Pt monitored by pulse ox and bp cuff. 

## 2015-10-17 NOTE — ED Notes (Signed)
Attempted report 

## 2015-10-17 NOTE — ED Provider Notes (Signed)
CSN: [id number]  Arrival date & time 10/17/15  1156 History   First MD Initiated Contact with Patient 10/17/15 1300     Chief Complaint  Patient presents with  . Abdominal Pain     (Consider location/radiation/quality/duration/timing/severity/associated sxs/prior Treatment) HPI   Patricia Erickson is a 44 y.o. female, with a history of kidney stones, presenting to the ED with abdominal pain and nausea since October 1, but it increased sharply last night. The pain woke pt up from sleep. Pain is sharp and cramping, generalized abdomen, but mostly in RUQ, 10/10, and non-radiating.  When pt called Dr. Vonna Kotyk office, she was told to come into the ED if she was having such intense pain. Pt denies vomiting. Pt has tried phenergan for the nausea this morning but it didn't help.  Pt was supposed to have a consultation for chole by Dr. Georgette Dover. Pt has had intermittent diarrhea and constipation over the past week. Also admits to subjective fever yesterday. Pt denies chest pain, shortness of breath, dizziness, or any other pain or complaints. Pt states she has a history of kidney stones and this does not feel like a kidney stone.  Past Medical History  Diagnosis Date  . Polyp of colon 03/11/2007    BENIGN  . Anxiety   . Migraines    Past Surgical History  Procedure Laterality Date  . Cesarean section  2000   Family History  Problem Relation Age of Onset  . Cancer Mother     COLON  . Osteoporosis Mother   . Breast cancer Mother 87  . Hypertension Father   . Osteoporosis Maternal Grandmother    Social History  Substance Use Topics  . Smoking status: Former Smoker    Quit date: 10/12/1992  . Smokeless tobacco: Never Used  . Alcohol Use: 0.0 oz/week    0 Standard drinks or equivalent per week     Comment: OCCASIONALLY   OB History    Gravida Para Term Preterm AB TAB SAB Ectopic Multiple Living   2 1 1  1  1   1      Review of Systems  Constitutional: Positive for fever and appetite change.   Respiratory: Negative for shortness of breath.   Cardiovascular: Negative for chest pain.  Gastrointestinal: Positive for nausea, abdominal pain, diarrhea and constipation. Negative for vomiting and blood in stool.  Genitourinary: Negative for dysuria, urgency, hematuria, flank pain and difficulty urinating.  Musculoskeletal: Negative for back pain.  Neurological: Negative for dizziness, weakness and light-headedness.  All other systems reviewed and are negative.     Allergies  Propoxyphene n-acetaminophen  Home Medications   Prior to Admission medications   Medication Sig Start Date End Date Taking? Authorizing Provider  candesartan (ATACAND) 8 MG tablet Take 8 mg by mouth daily. 10/02/15  Yes Historical Provider, MD  chlorproMAZINE (THORAZINE) 25 MG tablet Take 25 mg by mouth as needed (for migraines).    Yes Historical Provider, MD  cyclobenzaprine (FLEXERIL) 5 MG tablet Take 1 tablet (5 mg total) by mouth 3 (three) times daily as needed for muscle spasms. 07/05/15  Yes Juluis Mire, MD  levonorgestrel (MIRENA) 20 MCG/24HR IUD 1 each by Intrauterine route once. INSERTED 05/06/2007    Yes Historical Provider, MD  promethazine (PHENERGAN) 25 MG tablet Take 25 mg by mouth every 6 (six) hours as needed for nausea or vomiting.   Yes Historical Provider, MD  topiramate (TOPAMAX) 200 MG tablet Take 200 mg by mouth at bedtime.   Yes Historical  Provider, MD  traMADol (ULTRAM) 50 MG tablet Take 50 mg by mouth every 6 (six) hours as needed for moderate pain.   Yes Historical Provider, MD  fluconazole (DIFLUCAN) 150 MG tablet Take 1 tablet (150 mg total) by mouth once. Patient not taking: Reported on 10/17/2015 08/02/15   Terrance Mass, MD  ibuprofen (ADVIL,MOTRIN) 600 MG tablet Take 1 tablet (600 mg total) by mouth every 6 (six) hours as needed. Patient not taking: Reported on 10/17/2015 07/05/15   Juluis Mire, MD   BP 122/66 mmHg  Pulse 83  Temp(Src) 99 F (37.2 C) (Oral)  Resp 18   SpO2 99% Physical Exam  Constitutional: She appears well-developed and well-nourished. No distress.  HENT:  Head: Normocephalic and atraumatic.  Eyes: Conjunctivae are normal. Pupils are equal, round, and reactive to light.  Cardiovascular: Normal rate, regular rhythm, normal heart sounds and intact distal pulses.   Pulmonary/Chest: Effort normal and breath sounds normal. No respiratory distress.  Abdominal: Soft. Bowel sounds are normal. She exhibits no distension and no mass. There is tenderness. There is no rigidity, no CVA tenderness and no tenderness at McBurney's Erickson.  Pt is lying perfectly still on the bed, pt states that it hurts every time she moves so she tries to stay still. Generalized abdominal tenderness, but more intense in RUQ.   Musculoskeletal: She exhibits no edema or tenderness.  Neurological: She is alert.  Skin: Skin is warm and dry. She is not diaphoretic.  Nursing note and vitals reviewed.   ED Course  Procedures (including critical care time) Labs Review Labs Reviewed  COMPREHENSIVE METABOLIC PANEL - Abnormal; Notable for the following:    Chloride 112 (*)    CO2 18 (*)    Glucose, Bld 100 (*)    Calcium 8.7 (*)    Total Protein 6.3 (*)    All other components within normal limits  LIPASE, BLOOD  CBC  DIFFERENTIAL  URINALYSIS, ROUTINE W REFLEX MICROSCOPIC (NOT AT [facility])  URINALYSIS, ROUTINE W REFLEX MICROSCOPIC (NOT AT [facility])    Imaging Review US Abdomen Limited  10/17/2015  CLINICAL DATA:  Suspected cholecystitis.  Follow-up CT. EXAM: US ABDOMEN LIMITED - RIGHT UPPER QUADRANT COMPARISON:  CT abdomen and pelvis dated 10/11/2015. FINDINGS: Gallbladder: Two gallstones are identified within the gallbladder, measuring 1.6 and 1.3 cm respectively. There is no gallbladder wall thickening, pericholecystic fluid or other secondary signs of acute cholecystitis. Common bile duct: Diameter: Normal at 4 mm. No bile duct stone identified. No intrahepatic bile duct  dilatation. Liver: No focal lesion identified. Within normal limits in parenchymal echogenicity. No free fluid identified in the right upper quadrant. IMPRESSION: 1. Cholelithiasis without evidence of acute cholecystitis. 2. No acute findings. Electronically Signed   By: Franki Cabot M.D.   On: 10/17/2015 14:31   I have personally reviewed and evaluated these images and lab results as part of my medical decision-making.   EKG Interpretation None      MDM   Final diagnoses:  Right upper quadrant pain  Nausea  History of gallstones    Quinn Plowman Key presents with abdominal pain.  Findings and plan of care discussed with Leonard Schwartz, MD.  Suspect cholecystitis vs biliary colic. Plan to wait for [place] results prior to consulting General surgery.  2:43 PM Pt reassessed. [place] results still not available yet. Pt pain now down to a 7/10 and nausea has resolved.  Ultrasound shows cholelithiasis without evidence of acute cholecystitis. Dr. Audie Pinto will contact surgical service. Surgical  service called Dr. Audie Pinto back and stated that they would come see this patient in the ED. Ultrasound results were communicated with the patient and patient states understanding of them. Plan to control patient's pain and nausea, and defer to the surgical team's evaluation. 3:34 PM Pt now rates pain at 6/10, but maintains that her nausea is still resolved.  Surgical consult, Saverio Danker, PA-C,  evaluated patient. Their decision and findings are pending, but a H&P has been started.  Pt to be admitted by the general surgery service under Dr. Kieth Brightly. No requests for [place] to place temporary admit orders for this patient.  Lorayne Bender, PA-C 10/17/15 1629  Leonard Schwartz, MD 10/18/15 [phone]

## 2015-10-17 NOTE — ED Notes (Signed)
Patient returned placed on monitor.

## 2015-10-18 ENCOUNTER — Observation Stay (HOSPITAL_COMMUNITY): Payer: 59 | Admitting: Certified Registered Nurse Anesthetist

## 2015-10-18 ENCOUNTER — Encounter (HOSPITAL_COMMUNITY)
Admission: EM | Disposition: A | Discharge status: Home / Self Care | Payer: Self-pay | Source: Home / Self Care | Attending: Emergency Medicine

## 2015-10-18 HISTORY — PX: LAPAROSCOPIC CHOLECYSTECTOMY: SUR755

## 2015-10-18 HISTORY — PX: CHOLECYSTECTOMY: SHX55

## 2015-10-18 SURGERY — LAPAROSCOPIC CHOLECYSTECTOMY
Anesthesia: General | Site: Abdomen

## 2015-10-18 MED ORDER — HYDROCODONE-ACETAMINOPHEN 5-325 MG PO TABS
1.0000 | ORAL_TABLET | ORAL | Status: DC | PRN
  Administered 2015-10-19 (×3): 1 via ORAL
  Filled 2015-10-18 (×3): qty 1

## 2015-10-18 MED ORDER — DEXAMETHASONE SODIUM PHOSPHATE 4 MG/ML IJ SOLN
INTRAMUSCULAR | Status: DC | PRN
  Administered 2015-10-18: 8 mg via INTRAVENOUS

## 2015-10-18 MED ORDER — PROPOFOL 10 MG/ML IV BOLUS
INTRAVENOUS | Status: AC
  Filled 2015-10-18: qty 20

## 2015-10-18 MED ORDER — PROMETHAZINE HCL 25 MG/ML IJ SOLN
INTRAMUSCULAR | Status: AC
  Administered 2015-10-18: 6.25 mg via INTRAVENOUS
  Filled 2015-10-18: qty 1

## 2015-10-18 MED ORDER — NEOSTIGMINE METHYLSULFATE 10 MG/10ML IV SOLN
INTRAVENOUS | Status: DC | PRN
  Administered 2015-10-18: 3 mg via INTRAVENOUS

## 2015-10-18 MED ORDER — BUPIVACAINE-EPINEPHRINE 0.25% -1:200000 IJ SOLN
INTRAMUSCULAR | Status: DC | PRN
  Administered 2015-10-18: 17 mL

## 2015-10-18 MED ORDER — ROCURONIUM BROMIDE 50 MG/5ML IV SOLN
INTRAVENOUS | Status: AC
  Filled 2015-10-18: qty 1

## 2015-10-18 MED ORDER — LACTATED RINGERS IV SOLN
INTRAVENOUS | Status: DC
  Administered 2015-10-18: 13:00:00 via INTRAVENOUS

## 2015-10-18 MED ORDER — PROPOFOL 10 MG/ML IV BOLUS
INTRAVENOUS | Status: DC | PRN
  Administered 2015-10-18: 150 mg via INTRAVENOUS

## 2015-10-18 MED ORDER — PHENYLEPHRINE HCL 10 MG/ML IJ SOLN
INTRAMUSCULAR | Status: DC | PRN
  Administered 2015-10-18 (×2): 80 ug via INTRAVENOUS

## 2015-10-18 MED ORDER — SODIUM CHLORIDE 0.9 % IR SOLN
Status: DC | PRN
  Administered 2015-10-18: 1000 mL

## 2015-10-18 MED ORDER — 0.9 % SODIUM CHLORIDE (POUR BTL) OPTIME
TOPICAL | Status: DC | PRN
  Administered 2015-10-18: 1000 mL

## 2015-10-18 MED ORDER — SODIUM CHLORIDE 0.9 % IV SOLN
INTRAVENOUS | Status: DC
  Administered 2015-10-18 (×4): via INTRAVENOUS

## 2015-10-18 MED ORDER — CEFAZOLIN SODIUM-DEXTROSE 2-3 GM-% IV SOLR
INTRAVENOUS | Status: DC | PRN
  Administered 2015-10-18: 2 g via INTRAVENOUS

## 2015-10-18 MED ORDER — PHENYLEPHRINE 40 MCG/ML (10ML) SYRINGE FOR IV PUSH (FOR BLOOD PRESSURE SUPPORT)
PREFILLED_SYRINGE | INTRAVENOUS | Status: AC
  Filled 2015-10-18: qty 10

## 2015-10-18 MED ORDER — MIDAZOLAM HCL 2 MG/2ML IJ SOLN
INTRAMUSCULAR | Status: AC
  Filled 2015-10-18: qty 4

## 2015-10-18 MED ORDER — FENTANYL CITRATE (PF) 100 MCG/2ML IJ SOLN
INTRAMUSCULAR | Status: AC
  Administered 2015-10-18: 50 ug via INTRAVENOUS
  Filled 2015-10-18: qty 2

## 2015-10-18 MED ORDER — MIDAZOLAM HCL 5 MG/5ML IJ SOLN
INTRAMUSCULAR | Status: DC | PRN
  Administered 2015-10-18: 2 mg via INTRAVENOUS

## 2015-10-18 MED ORDER — PROMETHAZINE HCL 25 MG/ML IJ SOLN
12.5000 mg | Freq: Four times a day (QID) | INTRAMUSCULAR | Status: DC | PRN
  Administered 2015-10-18: 12.5 mg via INTRAVENOUS
  Filled 2015-10-18: qty 1

## 2015-10-18 MED ORDER — GLYCOPYRROLATE 0.2 MG/ML IJ SOLN
INTRAMUSCULAR | Status: DC | PRN
  Administered 2015-10-18: 0.4 mg via INTRAVENOUS

## 2015-10-18 MED ORDER — LIDOCAINE HCL (CARDIAC) 20 MG/ML IV SOLN
INTRAVENOUS | Status: AC
  Filled 2015-10-18: qty 5

## 2015-10-18 MED ORDER — PROMETHAZINE HCL 25 MG/ML IJ SOLN
6.2500 mg | INTRAMUSCULAR | Status: AC | PRN
  Administered 2015-10-18 (×2): 6.25 mg via INTRAVENOUS

## 2015-10-18 MED ORDER — FENTANYL CITRATE (PF) 100 MCG/2ML IJ SOLN
INTRAMUSCULAR | Status: DC | PRN
  Administered 2015-10-18: 50 ug via INTRAVENOUS
  Administered 2015-10-18: 100 ug via INTRAVENOUS

## 2015-10-18 MED ORDER — KETOROLAC TROMETHAMINE 30 MG/ML IJ SOLN
INTRAMUSCULAR | Status: DC | PRN
  Administered 2015-10-18: 30 mg via INTRAVENOUS

## 2015-10-18 MED ORDER — ONDANSETRON HCL 4 MG/2ML IJ SOLN
INTRAMUSCULAR | Status: DC | PRN
  Administered 2015-10-18: 4 mg via INTRAVENOUS

## 2015-10-18 MED ORDER — FENTANYL CITRATE (PF) 250 MCG/5ML IJ SOLN
INTRAMUSCULAR | Status: AC
  Filled 2015-10-18: qty 5

## 2015-10-18 MED ORDER — LIDOCAINE HCL (CARDIAC) 20 MG/ML IV SOLN
INTRAVENOUS | Status: DC | PRN
  Administered 2015-10-18: 100 mg via INTRAVENOUS

## 2015-10-18 MED ORDER — BUPIVACAINE-EPINEPHRINE (PF) 0.25% -1:200000 IJ SOLN
INTRAMUSCULAR | Status: AC
  Filled 2015-10-18: qty 30

## 2015-10-18 MED ORDER — ONDANSETRON HCL 4 MG/2ML IJ SOLN
INTRAMUSCULAR | Status: AC
  Filled 2015-10-18: qty 2

## 2015-10-18 MED ORDER — ROCURONIUM BROMIDE 100 MG/10ML IV SOLN
INTRAVENOUS | Status: DC | PRN
  Administered 2015-10-18: 30 mg via INTRAVENOUS

## 2015-10-18 MED ORDER — FENTANYL CITRATE (PF) 100 MCG/2ML IJ SOLN
25.0000 ug | INTRAMUSCULAR | Status: DC | PRN
  Administered 2015-10-18 (×2): 50 ug via INTRAVENOUS

## 2015-10-18 MED ORDER — HYDROMORPHONE HCL 1 MG/ML IJ SOLN
0.5000 mg | INTRAMUSCULAR | Status: DC | PRN
  Administered 2015-10-18: 0.5 mg via INTRAVENOUS
  Filled 2015-10-18: qty 1

## 2015-10-18 SURGICAL SUPPLY — 42 items
APPLIER CLIP ROT 10 11.4 M/L (STAPLE)
BANDAGE ADH SHEER 1  50/CT (GAUZE/BANDAGES/DRESSINGS) ×16 IMPLANT
BENZOIN TINCTURE PRP APPL 2/3 (GAUZE/BANDAGES/DRESSINGS) ×4 IMPLANT
BLADE SURG ROTATE 9660 (MISCELLANEOUS) ×4 IMPLANT
CANISTER SUCTION 2500CC (MISCELLANEOUS) ×4 IMPLANT
CHLORAPREP W/TINT 26ML (MISCELLANEOUS) ×4 IMPLANT
CLIP APPLIE ROT 10 11.4 M/L (STAPLE) IMPLANT
CLOSURE STERI-STRIP 1/2X4 (GAUZE/BANDAGES/DRESSINGS) ×1
CLOSURE WOUND 1/2 X4 (GAUZE/BANDAGES/DRESSINGS) ×1
CLSR STERI-STRIP ANTIMIC 1/2X4 (GAUZE/BANDAGES/DRESSINGS) ×3 IMPLANT
COVER MAYO STAND STRL (DRAPES) ×4 IMPLANT
COVER SURGICAL LIGHT HANDLE (MISCELLANEOUS) ×4 IMPLANT
DEVICE TROCAR PUNCTURE CLOSURE (ENDOMECHANICALS) ×4 IMPLANT
DRAPE C-ARM 42X72 X-RAY (DRAPES) IMPLANT
ELECT REM PT RETURN 9FT ADLT (ELECTROSURGICAL) ×4
ELECTRODE REM PT RTRN 9FT ADLT (ELECTROSURGICAL) ×2 IMPLANT
GLOVE BIOGEL PI IND STRL 7.0 (GLOVE) ×4 IMPLANT
GLOVE BIOGEL PI IND STRL 7.5 (GLOVE) ×2 IMPLANT
GLOVE BIOGEL PI INDICATOR 7.0 (GLOVE) ×4
GLOVE BIOGEL PI INDICATOR 7.5 (GLOVE) ×2
GLOVE SURG SS PI 7.0 STRL IVOR (GLOVE) ×8 IMPLANT
GOWN STRL REUS W/ TWL LRG LVL3 (GOWN DISPOSABLE) ×6 IMPLANT
GOWN STRL REUS W/TWL LRG LVL3 (GOWN DISPOSABLE) ×6
KIT BASIN OR (CUSTOM PROCEDURE TRAY) ×4 IMPLANT
KIT ROOM TURNOVER OR (KITS) ×4 IMPLANT
NS IRRIG 1000ML POUR BTL (IV SOLUTION) ×4 IMPLANT
PAD ARMBOARD 7.5X6 YLW CONV (MISCELLANEOUS) ×4 IMPLANT
POUCH RETRIEVAL ECOSAC 10 (ENDOMECHANICALS) ×2 IMPLANT
POUCH RETRIEVAL ECOSAC 10MM (ENDOMECHANICALS) ×2
SCISSORS LAP 5X35 DISP (ENDOMECHANICALS) ×4 IMPLANT
SET CHOLANGIOGRAPH 5 50 .035 (SET/KITS/TRAYS/PACK) ×4 IMPLANT
SET IRRIG TUBING LAPAROSCOPIC (IRRIGATION / IRRIGATOR) ×4 IMPLANT
SLEEVE ENDOPATH XCEL 5M (ENDOMECHANICALS) ×8 IMPLANT
SPECIMEN JAR SMALL (MISCELLANEOUS) ×4 IMPLANT
STRIP CLOSURE SKIN 1/2X4 (GAUZE/BANDAGES/DRESSINGS) ×3 IMPLANT
SUT MNCRL AB 4-0 PS2 18 (SUTURE) ×4 IMPLANT
TOWEL OR 17X24 6PK STRL BLUE (TOWEL DISPOSABLE) IMPLANT
TOWEL OR 17X26 10 PK STRL BLUE (TOWEL DISPOSABLE) ×4 IMPLANT
TRAY LAPAROSCOPIC MC (CUSTOM PROCEDURE TRAY) ×4 IMPLANT
TROCAR XCEL 12X100 BLDLESS (ENDOMECHANICALS) ×4 IMPLANT
TROCAR XCEL NON-BLD 5MMX100MML (ENDOMECHANICALS) ×4 IMPLANT
TUBING INSUFFLATION (TUBING) ×8 IMPLANT

## 2015-10-18 NOTE — Progress Notes (Signed)
Patient arrived to the floor around 1650.  Patient with complaints of nausea; incision site(s) appear to be clean and dry. Will continue to monitor.  Will start on clears and advance as tolerated per order.

## 2015-10-18 NOTE — Transfer of Care (Signed)
Immediate Anesthesia Transfer of Care Note  Patient: Patricia Erickson  Procedure(s) Performed: Procedure(s): LAPAROSCOPIC CHOLECYSTECTOMY (N/A)  Patient Location: PACU  Anesthesia Type:General  Level of Consciousness: awake, alert , oriented and patient cooperative  Airway & Oxygen Therapy: Patient Spontanous Breathing and Patient connected to nasal cannula oxygen  Post-op Assessment: Report given to RN and Post -op Vital signs reviewed and stable  Post vital signs: Reviewed and stable  Last Vitals:  Filed Vitals:   10/18/15 1533  BP:   Pulse:   Temp: 36.7 C  Resp:     Complications: No apparent anesthesia complications

## 2015-10-18 NOTE — Anesthesia Procedure Notes (Signed)
Procedure Name: Intubation Date/Time: 10/18/2015 2:27 PM Performed by: Carney Living Pre-anesthesia Checklist: Patient identified, Emergency Drugs available, Suction available, Patient being monitored and Timeout performed Patient Re-evaluated:Patient Re-evaluated prior to inductionOxygen Delivery Method: Circle system utilized Preoxygenation: Pre-oxygenation with 100% oxygen Intubation Type: IV induction Ventilation: Mask ventilation without difficulty and Oral airway inserted - appropriate to patient size Laryngoscope Size: Mac and 4 Grade View: Grade I Tube type: Oral Tube size: 7.0 mm Number of attempts: 1 Airway Equipment and Method: Stylet Placement Confirmation: ETT inserted through vocal cords under direct vision,  positive ETCO2 and breath sounds checked- equal and bilateral Secured at: 22 cm Tube secured with: Tape Dental Injury: Teeth and Oropharynx as per pre-operative assessment

## 2015-10-18 NOTE — Anesthesia Preprocedure Evaluation (Addendum)
Anesthesia Evaluation  Patient identified by MRN, date of birth, ID band Patient awake    Reviewed: Allergy & Precautions, NPO status , Patient's Chart, lab work & pertinent test results  History of Anesthesia Complications Negative for: history of anesthetic complications  Airway Mallampati: II  TM Distance: >3 FB Neck ROM: Full    Dental  (+) Teeth Intact, Dental Advisory Given   Pulmonary former smoker,    Pulmonary exam normal breath sounds clear to auscultation       Cardiovascular negative cardio ROS Normal cardiovascular exam Rhythm:Regular Rate:Normal     Neuro/Psych  Headaches, PSYCHIATRIC DISORDERS Anxiety    GI/Hepatic Neg liver ROS, Cholecystitis    Endo/Other  negative endocrine ROS  Renal/GU negative Renal ROS     Musculoskeletal negative musculoskeletal ROS (+)   Abdominal   Peds  Hematology negative hematology ROS (+)   Anesthesia Other Findings Day of surgery medications reviewed with the patient.  Reproductive/Obstetrics                            Anesthesia Physical Anesthesia Plan  ASA: II  Anesthesia Plan: General   Post-op Pain Management:    Induction: Intravenous  Airway Management Planned: Oral ETT  Additional Equipment:   Intra-op Plan:   Post-operative Plan: Extubation in OR  Informed Consent: I have reviewed the patients History and Physical, chart, labs and discussed the procedure including the risks, benefits and alternatives for the proposed anesthesia with the patient or authorized representative who has indicated his/her understanding and acceptance.   Dental advisory given  Plan Discussed with: CRNA, Anesthesiologist and Surgeon  Anesthesia Plan Comments: (Risks/benefits of general anesthesia discussed with patient including risk of damage to teeth, lips, gum, and tongue, nausea/vomiting, allergic reactions to medications, and the  possibility of heart attack, stroke and death.  All patient questions answered.  Patient wishes to proceed.)       Anesthesia Quick Evaluation

## 2015-10-18 NOTE — Progress Notes (Signed)
PT. Arrive to short stay with a contact in right eye. Nurse, Josie from [facility] came to take it back to pt's room.

## 2015-10-18 NOTE — Progress Notes (Signed)
Progress Note: General Surgery Service   Subjective: Pain started back up overnight, located mostly mid epigastrium  Objective: Vital signs in last 24 hours: Temp:  [97.9 F (36.6 C)-99 F (37.2 C)] 98.2 F (36.8 C) (11/09 0514) Pulse Rate:  [57-85] 60 (11/09 0514) Resp:  [16-18] 18 (11/09 0514) BP: (99-199)/(48-71) 99/48 mmHg (11/09 0514) SpO2:  [99 %-100 %] 100 % (11/09 0514) Weight:  [82.8 kg (182 lb 8.7 oz)] 82.8 kg (182 lb 8.7 oz) (11/08 1727) Last BM Date: 10/17/15  Intake/Output from previous day: 11/08 0701 - 11/09 0700 In: 711.7 [P.O.:320; I.V.:391.7] Out: 1050 [Urine:1050] Intake/Output this shift:    Lungs: CTAB  Cardiovascular: RRR  Abd: soft, TTP epigastrium  Extremities: no edema  Neuro: AOx4  Lab Results: CBC   Recent Labs  10/17/15 1232  WBC 7.4  HGB 14.2  HCT 42.0  PLT 251   BMET  Recent Labs  10/17/15 1232  NA 138  K 3.7  CL 112*  CO2 18*  GLUCOSE 100*  BUN 8  CREATININE 0.73  CALCIUM 8.7*   PT/INR No results for input(s): LABPROT, INR in the last 72 hours. ABG No results for input(s): PHART, HCO3 in the last 72 hours.  Invalid input(s): PCO2, PO2  Studies/Results:  Anti-infectives: Anti-infectives    Start     Dose/Rate Route Frequency Ordered Stop   10/18/15 0600  cefTRIAXone (ROCEPHIN) 2 g in dextrose 5 % 50 mL IVPB  Status:  Discontinued    Comments:  Pharmacy may adjust dosing strength, interval, or rate of medication as needed for optimal therapy for the patient Send with patient on call to the OR.  Anesthesia to complete antibiotic administration <31min prior to incision per [facility].   2 g 100 mL/hr over 30 Minutes Intravenous On call to O.R. 10/17/15 1626 10/17/15 1716   10/18/15 0600  cefTRIAXone (ROCEPHIN) 2 g in dextrose 5 % 50 mL IVPB    Comments:  Pharmacy may adjust dosing strength, interval, or rate of medication as needed for optimal therapy for the patient Send with patient on call to the OR.   Anesthesia to complete antibiotic administration <35min prior to incision per [facility].   2 g 100 mL/hr over 30 Minutes Intravenous To ShortStay Surgical 10/17/15 1716 10/19/15 0600      Medications: Scheduled Meds: . cefTRIAXone (ROCEPHIN)  IV  2 g Intravenous To SS-Surg  . enoxaparin (LOVENOX) injection  40 mg Subcutaneous Q24H  . irbesartan  75 mg Oral Daily  . topiramate  200 mg Oral QHS   Continuous Infusions: . dextrose 5 % and 0.9% NaCl 100 mL/hr at 10/18/15 0105   PRN Meds:.chlorproMAZINE, cyclobenzaprine, HYDROmorphone (DILAUDID) injection, ondansetron (ZOFRAN) IV, ondansetron **OR** ondansetron (ZOFRAN) IV, oxyCODONE  Assessment/Plan: Patient Active Problem List   Diagnosis Date Noted  . Gallstones 10/17/2015  . Abdominal pain 10/17/2015  . Symptomatic cholelithiasis 10/17/2015  . Healthcare maintenance 07/05/2015  . Intermittent palpitations 07/05/2015  . Family history of colon cancer 10/12/2012  . Family history of breast cancer in female 10/12/2012  . MIGRAINE HEADACHE 01/19/2009  . HEMORRHOIDS 01/10/2009  . COLONIC POLYPS, HX OF 01/10/2009   Epigastric pain for multiple months without improvement with different therapies and dietary changes, finding of gallstones on [place] and CT. We discussed again at length that this could be atypical biliary pains and I believe taking her gallbladder out has a good change to improve symptoms, though it may not cure all symptoms. We discussed risks including bleeding,  liver injury, bile leak, CBD injury, need for IOC, need for open procedure, post-cholecystectomy syndrome and benefits of better diagnostic possibilities, improvement of symptoms. She showed good understanding and wanted to proceed. -lap chole with poss IOC    Mickeal Skinner, MD Pg# [phone] [facility]

## 2015-10-18 NOTE — Op Note (Signed)
Preoperative diagnosis: symptomatic gallstones  Postoperative diagnosis: Same   Procedure: laparoscopic cholecystectomy  Surgeon: Gurney Maxin, M.D.  Asst: none  Anesthesia: Gen.   Indications for procedure: Patricia Erickson is a 44 y.o. female with symptoms of Abdominal pain and Nausea consistent with gallbladder disease, Confirmed by Ultrasound and CT.  Description of procedure: The patient was brought into the operative suite, placed supine. Anesthesia was administered with endotracheal tube. Patient was strapped in place and foot board was secured. All pressure points were offloaded by foam padding. The patient was prepped and draped in the usual sterile fashion.  A small incision was made to the right of the umbilicus. A 66mm trocar was inserted into the peritoneal cavity with optical entry. Pneumoperitoneum was applied with high flow low pressure. 2 63mm trocars were placed in the RUQ. A 17mm trocar was placed in the subxiphoid space. All trocars sites were first anesthesized with 0.25% marcaine with epinephrine in the subcutaneous and preperitoneal layers. Next the patient was placed in reverse trendelenberg. The gallbladder was retracted cephalad and lateral. The peritoneum was reflected off the infundibulum working lateral to medial.   The cystic duct and cystic artery were identified and further dissection revealed a critical view. The cystic duct and cystic artery were doubly clipped and ligated.   The gallbladder was removed off the liver bed with cautery. The Gallbladder was placed in a specimen bag. The gallbladder fossa was irrigated and hemostasis was applied with cautery. The gallbladder was removed via the 68mm trocar.   The rest of the abdomen was surveyed, there was no inflammation of stomach and the visible intestine was unremarkable. She did have a few small ovarian cysts on the right side, of which a picture was taken.  Pneumoperitoneum was removed, all trocar were removed.  All incisions were closed with 4-0 monocryl subcuticular stitch. The patient woke from anesthesia and was brought to PACU in stable condition.  Findings: chronic cholecystitis, right ovarian cysts.  Specimen: gallbldadder  Blood loss: <50cc  Local anesthesia: 17cc 0.25% marcaine w epi  Complications: none  Gurney Maxin, M.D. General, Bariatric, & Minimally Invasive Surgery [facility]

## 2015-10-19 ENCOUNTER — Encounter (HOSPITAL_COMMUNITY): Payer: Self-pay | Admitting: General Surgery

## 2015-10-19 MED ORDER — HYDROCODONE-ACETAMINOPHEN 5-325 MG PO TABS
1.0000 | ORAL_TABLET | ORAL | Status: DC | PRN
Start: 2015-10-19 — End: 2016-11-18

## 2015-10-19 NOTE — Discharge Instructions (Signed)

## 2015-10-19 NOTE — Discharge Summary (Signed)
Physician Discharge Summary  Patient ID: Patricia Erickson MRN: [id number] DOB/AGE: 46-Dec-1972 46 y.o.  Admit date: 10/17/2015 Discharge date: 10/19/2015  Admitting Diagnosis: Cholelithiasis   Discharge Diagnosis Patient Active Problem List   Diagnosis Date Noted  . Gallstones 10/17/2015  . Abdominal pain 10/17/2015  . Symptomatic cholelithiasis 10/17/2015  . Healthcare maintenance 07/05/2015  . Intermittent palpitations 07/05/2015  . Family history of colon cancer 10/12/2012  . Family history of breast cancer in female 10/12/2012  . MIGRAINE HEADACHE 01/19/2009  . HEMORRHOIDS 01/10/2009  . COLONIC POLYPS, HX OF 01/10/2009    Consultants none  Imaging: US Abdomen Limited  10/17/2015  CLINICAL DATA:  Suspected cholecystitis.  Follow-up CT. EXAM: US ABDOMEN LIMITED - RIGHT UPPER QUADRANT COMPARISON:  CT abdomen and pelvis dated 10/11/2015. FINDINGS: Gallbladder: Two gallstones are identified within the gallbladder, measuring 1.6 and 1.3 cm respectively. There is no gallbladder wall thickening, pericholecystic fluid or other secondary signs of acute cholecystitis. Common bile duct: Diameter: Normal at 4 mm. No bile duct stone identified. No intrahepatic bile duct dilatation. Liver: No focal lesion identified. Within normal limits in parenchymal echogenicity. No free fluid identified in the right upper quadrant. IMPRESSION: 1. Cholelithiasis without evidence of acute cholecystitis. 2. No acute findings. Electronically Signed   By: Franki Cabot M.D.   On: 10/17/2015 14:31    Procedures Laparoscopic cholecystectomy---Dr. Carlus Pavlov Course:  Patricia Erickson presented to [facility] with abdominal pain x6 months which suddenly worsened.  Workup showed gallstones.  Patient was admitted and underwent procedure listed above.  Tolerated procedure well and was transferred to the floor.  Diet was advanced as tolerated.  On POD#1, the patient was voiding well, tolerating diet, ambulating well, pain  well controlled, vital signs stable, incisions c/d/i and felt stable for discharge home. Medication risks, benefits and therapeutic alternatives were reviewed with the patient.  She verbalizes understanding.  Patient will follow up in our office in  weeks and knows to call with questions or concerns.  Physical Exam: General:  Alert, NAD, pleasant, comfortable Abd:  Soft, ND, mild tenderness, incisions C/D/I    Medication List    STOP taking these medications        fluconazole 150 MG tablet  Commonly known as:  DIFLUCAN     ibuprofen 600 MG tablet  Commonly known as:  ADVIL,MOTRIN      TAKE these medications        candesartan 8 MG tablet  Commonly known as:  ATACAND  Take 8 mg by mouth daily.     chlorproMAZINE 25 MG tablet  Commonly known as:  THORAZINE  Take 25 mg by mouth as needed (for migraines).     cyclobenzaprine 5 MG tablet  Commonly known as:  FLEXERIL  Take 1 tablet (5 mg total) by mouth 3 (three) times daily as needed for muscle spasms.     HYDROcodone-acetaminophen 5-325 MG tablet  Commonly known as:  NORCO/VICODIN  Take 1 tablet by mouth every 4 (four) hours as needed for moderate pain.     levonorgestrel 20 MCG/24HR IUD  Commonly known as:  MIRENA  1 each by Intrauterine route once. INSERTED 05/06/2007     promethazine 25 MG tablet  Commonly known as:  PHENERGAN  Take 25 mg by mouth every 6 (six) hours as needed for nausea or vomiting.     topiramate 200 MG tablet  Commonly known as:  TOPAMAX  Take 200 mg by mouth at bedtime.     traMADol  50 MG tablet  Commonly known as:  ULTRAM  Take 50 mg by mouth every 6 (six) hours as needed for moderate pain.             Follow-up Information    Follow up with [facility] On 11/08/2015.   Specialty:  General Surgery   Why:  arrive by 8:30aM for a 9AM post operative check up with [facility] the PA   Contact information:   [address] [place] [address] [phone]        Signed: Erby Pian, [facility] [phone]  10/19/2015, 9:16 AM

## 2015-10-23 NOTE — Anesthesia Postprocedure Evaluation (Signed)
Anesthesia Post Note  Patient: Patricia Erickson  Procedure(s) Performed: Procedure(s) (LRB): LAPAROSCOPIC CHOLECYSTECTOMY (N/A)  Anesthesia type: General  Patient location: PACU  Post pain: Pain level controlled and Adequate analgesia  Post assessment: Post-op Vital signs reviewed, Patient's Cardiovascular Status Stable, Respiratory Function Stable, Patent Airway and Pain level controlled  Last Vitals:  Filed Vitals:   10/19/15 1003  BP: 104/61  Pulse: 60  Temp: 36.8 C  Resp: 18    Post vital signs: Reviewed and stable  Level of consciousness: awake, alert  and oriented  Complications: No apparent anesthesia complications

## 2015-11-13 ENCOUNTER — Encounter: Payer: Self-pay | Admitting: Gynecology

## 2015-11-13 ENCOUNTER — Ambulatory Visit (INDEPENDENT_AMBULATORY_CARE_PROVIDER_SITE_OTHER): Payer: 59 | Admitting: Gynecology

## 2015-11-13 ENCOUNTER — Telehealth: Payer: Self-pay | Admitting: *Deleted

## 2015-11-13 VITALS — BP 128/86 | Ht 65.5 in | Wt 182.0 lb

## 2015-11-13 DIAGNOSIS — Z01419 Encounter for gynecological examination (general) (routine) without abnormal findings: Secondary | ICD-10-CM

## 2015-11-13 DIAGNOSIS — Z803 Family history of malignant neoplasm of breast: Secondary | ICD-10-CM

## 2015-11-13 DIAGNOSIS — Z800 Family history of malignant neoplasm of digestive organs: Secondary | ICD-10-CM

## 2015-11-13 LAB — COMPREHENSIVE METABOLIC PANEL
ALT: 13 U/L (ref 6–29)
AST: 13 U/L (ref 10–35)
Albumin: 4.3 g/dL (ref 3.6–5.1)
Alkaline Phosphatase: 66 U/L (ref 33–115)
BUN: 14 mg/dL (ref 7–25)
CO2: 24 mmol/L (ref 20–31)
Calcium: 8.7 mg/dL (ref 8.6–10.2)
Chloride: 110 mmol/L (ref 98–110)
Creat: 0.73 mg/dL (ref 0.50–1.10)
Glucose, Bld: 83 mg/dL (ref 65–99)
Potassium: 4.4 mmol/L (ref 3.5–5.3)
Sodium: 140 mmol/L (ref 135–146)
Total Bilirubin: 0.4 mg/dL (ref 0.2–1.2)
Total Protein: 6.4 g/dL (ref 6.1–8.1)

## 2015-11-13 LAB — LIPID PANEL
Cholesterol: 180 mg/dL (ref 125–200)
HDL: 59 mg/dL (ref >=46)
LDL Cholesterol: 111 mg/dL (ref <130)
Total CHOL/HDL Ratio: 3.1 Ratio (ref <=5.0)
Triglycerides: 50 mg/dL (ref <150)
VLDL: 10 mg/dL (ref <30)

## 2015-11-13 LAB — URINALYSIS W MICROSCOPIC + REFLEX CULTURE
Bacteria, UA: NONE SEEN [HPF]
Bilirubin Urine: NEGATIVE
Casts: NONE SEEN [LPF]
Crystals: NONE SEEN [HPF]
Glucose, UA: NEGATIVE
Hgb urine dipstick: NEGATIVE
Ketones, ur: NEGATIVE
Leukocytes, UA: NEGATIVE
Nitrite: NEGATIVE
Protein, ur: NEGATIVE
Specific Gravity, Urine: 1.019 (ref 1.001–1.035)
Yeast: NONE SEEN [HPF]
pH: 7 (ref 5.0–8.0)

## 2015-11-13 LAB — CBC WITH DIFFERENTIAL/PLATELET
Basophils Absolute: 0.1 10*3/uL (ref 0.0–0.1)
Basophils Relative: 1 % (ref 0–1)
Eosinophils Absolute: 0.2 10*3/uL (ref 0.0–0.7)
Eosinophils Relative: 2 % (ref 0–5)
HCT: 40.2 % (ref 36.0–46.0)
Hemoglobin: 14 g/dL (ref 12.0–15.0)
Lymphocytes Relative: 24 % (ref 12–46)
Lymphs Abs: 1.8 10*3/uL (ref 0.7–4.0)
MCH: 30.7 pg (ref 26.0–34.0)
MCHC: 34.8 g/dL (ref 30.0–36.0)
MCV: 88.2 fL (ref 78.0–100.0)
MPV: 9 fL (ref 8.6–12.4)
Monocytes Absolute: 0.6 10*3/uL (ref 0.1–1.0)
Monocytes Relative: 8 % (ref 3–12)
Neutro Abs: 4.9 10*3/uL (ref 1.7–7.7)
Neutrophils Relative %: 65 % (ref 43–77)
Platelets: 280 10*3/uL (ref 150–400)
RBC: 4.56 MIL/uL (ref 3.87–5.11)
RDW: 14.1 % (ref 11.5–15.5)
WBC: 7.6 10*3/uL (ref 4.0–10.5)

## 2015-11-13 LAB — TSH: TSH: 0.577 u[IU]/mL (ref 0.350–4.500)

## 2015-11-13 NOTE — Telephone Encounter (Signed)
Referral placed at [facility] they will contact pt to schedule. 

## 2015-11-13 NOTE — Progress Notes (Signed)
Patricia Erickson 1971-03-14 [id number]   History:    44 y.o.  for annual gyn exam with no complaints today. Patient is having very minimal menstrual cycle since she had the Mirena IUD placed in 2013. Patient last month had laparoscopic cholecystectomy and is recovering well. Correction from previous last exam was indicated that she had colon cancer was a typo error. She has had personal history of colon polyps. Family history of cancer and polyps as follows:  Mother with history of breast and colon cancer  Sister and patient herself with benign colon polyp history  Patient with normal bone density in 2007. Patient with no past history of any abnormal Pap smears. Patient declined flu vaccine. Patient fasting today for blood work.  Past medical history,surgical history, family history and social history were all reviewed and documented in the EPIC chart.  Gynecologic History No LMP recorded. Patient is not currently having periods (Reason: IUD). Contraception: IUD Last Pap: 2015. Results were: normal Last mammogram: Normal. Results were: Three-dimensional mammogram dense breasts normal  Obstetric History OB History  Gravida Para Term Preterm AB SAB TAB Ectopic Multiple Living  2 1 1  1 1    1     # Outcome Date GA Lbr Len/2nd Weight Sex Delivery Anes PTL Lv  2 SAB           1 Term     M CS-Unspec  N Y       ROS: A ROS was performed and pertinent positives and negatives are included in the history.  GENERAL: No fevers or chills. HEENT: No change in vision, no earache, sore throat or sinus congestion. NECK: No pain or stiffness. CARDIOVASCULAR: No chest pain or pressure. No palpitations. PULMONARY: No shortness of breath, cough or wheeze. GASTROINTESTINAL: No abdominal pain, nausea, vomiting or diarrhea, melena or bright red blood per rectum. GENITOURINARY: No urinary frequency, urgency, hesitancy or dysuria. MUSCULOSKELETAL: No joint or muscle pain, no back pain, no recent trauma.  DERMATOLOGIC: No rash, no itching, no lesions. ENDOCRINE: No polyuria, polydipsia, no heat or cold intolerance. No recent change in weight. HEMATOLOGICAL: No anemia or easy bruising or bleeding. NEUROLOGIC: No headache, seizures, numbness, tingling or weakness. PSYCHIATRIC: No depression, no loss of interest in normal activity or change in sleep pattern.     Exam: chaperone present  BP 128/86 mmHg  Ht 5' 5.5" (1.664 m)  Wt 182 lb (82.555 kg)  BMI 29.82 kg/m2  Body mass index is 29.82 kg/(m^2).  General appearance : Well developed well nourished female. No acute distress HEENT: Eyes: no retinal hemorrhage or exudates,  Neck supple, trachea midline, no carotid bruits, no thyroidmegaly Lungs: Clear to auscultation, no rhonchi or wheezes, or rib retractions  Heart: Regular rate and rhythm, no murmurs or gallops Breast:Examined in sitting and supine position were symmetrical in appearance, no palpable masses or tenderness,  no skin retraction, no nipple inversion, no nipple discharge, no skin discoloration, no axillary or supraclavicular lymphadenopathy Abdomen: no palpable masses or tenderness, no rebound or guarding Extremities: no edema or skin discoloration or tenderness  Pelvic:  Bartholin, Urethra, Skene Glands: Within normal limits             Vagina: No gross lesions or discharge  Cervix: No gross lesions or discharge, IUD string visualized  Uterus  anteverted, normal size, shape and consistency, non-tender and mobile  Adnexa  Without masses or tenderness  Anus and perineum  normal   Rectovaginal  normal sphincter tone without  palpated masses or tenderness             Hemoccult cards will be provided     Assessment/Plan:  44 y.o. female for annual exam will be referred once again to the [facility] geneticist because of multiple family members with various cancers . The following screening blood work was ordered today: Comprehensive metabolic panel, fasting lipid  profile, TSH, CBC, and urinalysis. We discussed importance of calcium vitamin D for osteoporosis prevention. We discussed importance of regular exercise. We discussed importance of monthly breast exams. Pap smear not indicated this year. Patient was reminded to submit to the office the fecal Hemoccult cards for testing.   Terrance Mass MD, 10:01 AM 11/13/2015

## 2015-11-13 NOTE — Telephone Encounter (Signed)
-----   Message from Terrance Mass, MD sent at 11/13/2015 10:05 AM EST ----- Please coordinate consultation with geneticist at [facility] for this patient with strong family history of breast and colon cancer.

## 2015-11-14 LAB — URINE CULTURE: Colony Count: 40000

## 2015-11-15 ENCOUNTER — Telehealth: Payer: Self-pay | Admitting: Genetic Counselor

## 2015-11-15 NOTE — Telephone Encounter (Signed)
Appointment 12/05/15 @ 9:30am

## 2015-11-15 NOTE — Telephone Encounter (Signed)
[facility] REGARDING GENETIC COUNSELING REFERRAL °

## 2015-11-17 ENCOUNTER — Telehealth: Payer: Self-pay | Admitting: Genetic Counselor

## 2015-11-17 NOTE — Telephone Encounter (Signed)
[name] called to discuss her upcoming genetic counseling appt.  She was inquiring how much genetic testing would be/whether that would be covered by insurance.  We discussed the lab's policy for genetic testing and that they will contact her if her OOP is over $100.  [name] would not feel comfortable paying $100, so we discussed that we can ask the lab to perform a benefits investigation before we order testing and draw her blood.  [name] is interested in this.  We discussed her family history of cancer and her personal history of colon polyps.  To better determine whether she would meet criteria for testing, I let her know I would look over her pathology reports and get back to her with that information.  Following this, we discussed that genetic testing should be indicated based on the information given.  The BI will take approximately 72 hours and I will need to fax in the family history she provided me with as well as a front and back copy of her insurance card with the test order form.  [name] has given me her permission to proceed and I will do that.  Once the BI is completed the lab will let her know what her OOP and I will ask that they let me know as well.

## 2015-11-27 ENCOUNTER — Telehealth: Payer: Self-pay | Admitting: Genetic Counselor

## 2015-11-27 NOTE — Telephone Encounter (Signed)
Discussed with Patricia Erickson that her estimated OOP cost for genetic testing is $0.  Discussed that this can change if, once we meet for her genetic counseling session, that we find she has further familial cancer history that warrants a different testing order.  Patricia Erickson knows she is welcome to call me with any questions in the meantime.  Reminded her that her appt is scheduled for Tuesday, December 27th at 9 AM and that she should come 15-20 mins early to check in in the lobby of the [facility].

## 2015-12-05 ENCOUNTER — Encounter: Payer: Self-pay | Admitting: Genetic Counselor

## 2015-12-05 ENCOUNTER — Other Ambulatory Visit: Payer: 59

## 2015-12-05 ENCOUNTER — Ambulatory Visit (HOSPITAL_BASED_OUTPATIENT_CLINIC_OR_DEPARTMENT_OTHER): Payer: 59 | Admitting: Genetic Counselor

## 2015-12-05 DIAGNOSIS — Z8052 Family history of malignant neoplasm of bladder: Secondary | ICD-10-CM | POA: Diagnosis not present

## 2015-12-05 DIAGNOSIS — Z800 Family history of malignant neoplasm of digestive organs: Secondary | ICD-10-CM | POA: Diagnosis not present

## 2015-12-05 DIAGNOSIS — Z315 Encounter for genetic counseling: Secondary | ICD-10-CM

## 2015-12-05 DIAGNOSIS — Z801 Family history of malignant neoplasm of trachea, bronchus and lung: Secondary | ICD-10-CM

## 2015-12-05 DIAGNOSIS — Z807 Family history of other malignant neoplasms of lymphoid, hematopoietic and related tissues: Secondary | ICD-10-CM

## 2015-12-05 DIAGNOSIS — Z86010 Personal history of colonic polyps: Secondary | ICD-10-CM

## 2015-12-05 DIAGNOSIS — Z8371 Family history of colonic polyps: Secondary | ICD-10-CM | POA: Diagnosis not present

## 2015-12-05 DIAGNOSIS — Z803 Family history of malignant neoplasm of breast: Secondary | ICD-10-CM | POA: Diagnosis not present

## 2015-12-05 NOTE — Progress Notes (Signed)
REFERRING PROVIDER: Terrance Mass., MD  PRIMARY PROVIDER:  Jacques Earthly, MD  PRIMARY REASON FOR VISIT:  1. Family history of colon cancer   2. COLONIC POLYPS, HX OF   3. Family history of breast cancer in female   47. Family history of colonic polyps   5. Family history of lymphoma   6. Family history of lung cancer   7. Family history of bladder cancer   8. Family history of throat cancer      HISTORY OF PRESENT ILLNESS:   [name], a 44 y.o. female, was seen for a [facility] cancer genetics consultation at the request of Dr. Toney Rakes due to a personal history of multiple colon polyps and family history of early-onset colon cancer, colon polyps, lymphoma, and other cancers.  [name] presents to clinic today to discuss the possibility of a hereditary predisposition to cancer, genetic testing, and to further clarify her future cancer risks, as well as potential cancer risks for family members.   In 2008, at the age of 70, 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 This was treated with resection.  In 2011, [name] had a normal colonoscopy, and in July 2016, one hyperplastic rectosigmoid polyp was found.  [name] has never been diagnosed with cancer.   CANCER HISTORY:   No history exists.     HORMONAL RISK FACTORS:  Menarche was at age 54.  First live birth at age 56.  OCP use for approximately at least 10 years.  Ovaries intact: yes.  Hysterectomy: no.  Menopausal status: premenopausal.  HRT use: 0 years. Colonoscopy: yes; colonoscopy in 2008 found multiple polyps, tubulovillous adenoma fragments of right colon; 2011 colonoscopy was normal; most recent colonoscopy in July 2016 found one hyperplastic rectosigmoid polyp; return in 2021. Mammogram within the last year: yes. Number of breast biopsies: 0. Up to date with pelvic exams:  yes. Any excessive radiation exposure in the past:  no  Past Medical History  Diagnosis Date  . Polyp  of colon 03/11/2007    BENIGN  . Anxiety   . Migraines     Past Surgical History  Procedure Laterality Date  . Cesarean section  2000  . Cholecystectomy N/A 10/18/2015    Procedure: LAPAROSCOPIC CHOLECYSTECTOMY;  Surgeon: Mickeal Skinner, MD;  Location: [facility];  Service: General;  Laterality: N/A;  . Laparoscopic cholecystectomy  nov 9 16    Social History   Social History  . Marital Status: Divorced    Spouse Name: N/A  . Number of Children: N/A  . Years of Education: N/A   Social History Main Topics  . Smoking status: Former Smoker -- 0.50 packs/day for 7 years    Types: Cigarettes    Quit date: 10/12/1992  . Smokeless tobacco: Never Used  . Alcohol Use: 0.0 oz/week    0 Standard drinks or equivalent per week     Comment: OCCASIONALLY  . Drug Use: No  . Sexual Activity: Yes    Birth Control/ Protection: IUD     Comment: Mirena 10/29/2012   Other Topics Concern  . None   Social History Narrative     FAMILY HISTORY:  We obtained a detailed, 4-generation family history.  Significant diagnoses are listed below: Family History  Problem Relation Age of Onset  . Osteoporosis Mother   . Breast cancer Mother 41  . Colon cancer Mother 75    removed large intestine  . Lymphoma Mother 79  .  Hypertension Father   . Osteoporosis Maternal Grandmother   . Lymphoma Maternal Grandmother 80  . Colon polyps Sister     "several" dx. 74 or younger  . Other Sister     hysterectomy at 65; suspicious breast finding removed at 27  . Colon cancer Maternal Aunt 27  . Lymphoma Maternal Uncle 63  . Lung cancer Maternal Grandfather 66    metastatic to bone; former smoker  . Throat cancer Paternal Grandmother 22    not a smoker  . Heart Problems Paternal Grandfather   . Other Other     suspicious breast finding at 35 - recent follow-up normal  . Heart Problems Maternal Uncle   . Bladder Cancer Paternal Uncle 51  . Throat cancer Paternal Uncle     dx. before age 48    Ms.  Patricia Erickson has one son, age 38, who has never been diagnosed with cancer or tumors.  She has one full sister who is currently 79.  Her sister also has a history of "several" colon polyps; she's been having colonoscopies since her early 76s.  This sister also has a history of a hysterectomy at age 47 and has recently had a suspicious breast finding which was removed and is being watched closely.  This sister has one daughter, age 49, and one son, age 79.  [name] niece also has a history of a suspicious breast finding, for which follow-up was normal.    [name] mother is currently 18.  She has a history of colon cancer, diagnosed at 24 (for which her large intestine was removed); a history of lymphoma diagnosed at 72; and a history of breast cancer, diagnosed at 42.  [name] mother has two full brothers and one full sister.  Only one brother is still living at 60; he has never been diagnosed with cancer.  [name] maternal aunt died of colon cancer at 87.  She has two sons, both currently in their late 63s, with whom [name] has limited contact.  [name] maternal uncle died of lymphoma at 44.  He also has sons, but they do not live locally and [name] is not in touch with them.  [name] maternal grandmother died of lymphoma at the age of 49.  She had one brother and one sister, for whom [name] has no further information.  [name] maternal grandfather died of metastatic lung cancer (mets to bone) at the age of 71.  He was a former smoker.  She has no further information for any maternal great aunts/uncles or great grandparents.    [name] father is currently 34 and has never had cancer or any colon polyps that she is aware of.  He has two full brothers and one full sister--all of whom are still living and are age 71-77.  One paternal uncle has a history of throat cancer and was recently diagnosed with bladder cancer.  [name] has never seen this uncle smoke, but reports that he may have been a former  smoker in the past.  This uncle has no children of his own.  None of [name] other paternal first cousins have been diagnosed with cancer.  Her paternal grandmother died of throat cancer at 58--she was not a smoker.  Her paternal grandfather died at the age of 76-77 from heart-related causes.  She has no further information for any paternal great aunts/uncles or great grandparents.    [name] is unaware of any  previous genetic testing in the family.  Patient's maternal and paternal ancestors are of Caucasian descent. There is no reported Ashkenazi Jewish ancestry. There is no known consanguinity.  GENETIC COUNSELING ASSESSMENT: Patricia Erickson is a 44 y.o. female with a personal history of colon polyps and family history of colon cancer and polyps which is somewhat suggestive of a hereditary colon cancer syndrome and predisposition to colon polyps and cancer. We, therefore, discussed and recommended the following at today's visit.   DISCUSSION: We reviewed the characteristics, features and inheritance patterns of hereditary cancer syndromes, particularly those caused by mutations within the APC, Lynch syndrome, CHEK2, and MUTYH genes. We also discussed genetic testing, including the appropriate family members to test, the process of testing, insurance coverage and turn-around-time for results. We discussed the implications of a negative, positive and/or variant of uncertain significant result. We recommended [name] pursue genetic testing for the 19-gene Colorectal Cancer Panel with MSH2 Exons 1-7 Inversion Analysis through [facility] Hope Pigeon, MD).  The Colorectal Cancer Panel offered by [facility] includes sequencing and/or duplication/deletion testing of the following 19 genes: APC, ATM, AXIN2, BMPR1A, CDH1, CHEK2, EPCAM, MLH1, MSH2, MSH6, MUTYH, PMS2, POLD1, POLE, PTEN, SCG5/GREM1, SMAD4, STK11, and TP53.    Based on [name] personal history of polyps and family history of polyps and cancer,  she meets medical criteria for genetic testing. Despite that she meets criteria, she may still have an out of pocket cost. We discussed that if her out of pocket cost for testing is over $100, the laboratory will call and confirm whether she wants to proceed with testing.  If the out of pocket cost of testing is less than $100 she will be billed by the genetic testing laboratory.  We also asked that the lab perform a benefits investigation for this test prior to [name] genetic counseling session.  [facility] quoted an estimate $0 out-of-pocket for [name] for the aforementioned testing, so we expect that this will not have changed for her.  PLAN: After considering the risks, benefits, and limitations, [name]  provided informed consent to pursue genetic testing and the blood sample was sent to [facility] for analysis of the 19-gene Colorectal Cancer Panel with MSH2 Exons 1-7 Inversion Analysis. Results should be available within approximately 2-3 weeks' time, at which point they will be disclosed by telephone to [name], as will any additional recommendations warranted by these results. [name] will receive a summary of her genetic counseling visit and a copy of her results once available. This information will also be available in Epic. We encouraged [name] to remain in contact with cancer genetics annually so that we can continuously update the family history and inform her of any changes in cancer genetics and testing that may be of benefit for her family. [name] questions were answered to her satisfaction today. Our contact information was provided should additional questions or concerns arise.  Thank you for the referral and allowing [place] to share in the care of your patient.   Jeanine Luz, MS Genetic Counselor [email or web address] Phone: [phone]  The patient was seen for a total of 60 minutes in face-to-face genetic counseling.  This patient was discussed with Drs. Magrinat,  Lindi Adie and/or Burr Medico who agrees with the above.    _______________________________________________________________________ For Office Staff:  Number of people involved in session: 1 Was an Intern/ student involved with case: no

## 2015-12-18 ENCOUNTER — Telehealth: Payer: Self-pay | Admitting: Genetic Counselor

## 2015-12-18 ENCOUNTER — Ambulatory Visit: Payer: Self-pay | Admitting: Genetic Counselor

## 2015-12-18 DIAGNOSIS — Z86010 Personal history of colonic polyps: Secondary | ICD-10-CM

## 2015-12-18 DIAGNOSIS — Z1379 Encounter for other screening for genetic and chromosomal anomalies: Secondary | ICD-10-CM | POA: Insufficient documentation

## 2015-12-18 DIAGNOSIS — Z800 Family history of malignant neoplasm of digestive organs: Secondary | ICD-10-CM

## 2015-12-18 DIAGNOSIS — Z803 Family history of malignant neoplasm of breast: Secondary | ICD-10-CM

## 2015-12-18 NOTE — Progress Notes (Signed)
GENETIC TEST RESULT  HPI: [name] was previously seen in the [facility] due to a personal history of multiple adenomatous colon polyps, family history of colon polyps and colon cancer, and concerns regarding a hereditary predisposition to colon polyps and cancer. Please refer to our prior cancer genetics clinic note from December 05, 2015 for more information regarding [name] medical, social and family histories, and our assessment and recommendations, at the time. [name] recent genetic test results were disclosed to her, as were recommendations warranted by these results. These results and recommendations are discussed in more detail below.  GENETIC TEST RESULTS: At the time of [name] visit on 12/05/15, we recommended she pursue genetic testing of the 19-gene Colorectal Cancer Panel with MSH2 Exons 1-7 Inversion Analysis.  The Colorectal Cancer Panel offered by [facility] includes sequencing and/or duplication/deletion testing of the following 19 genes: APC, ATM, AXIN2, BMPR1A, CDH1, CHEK2, EPCAM, MLH1, MSH2, MSH6, MUTYH, PMS2, POLD1, POLE, PTEN, SCG5/GREM1, SMAD4, STK11, and TP53.  Those results are now back, the report date for which is December 15, 2015.  Genetic testing was normal, and did not reveal a deleterious mutation in these genes.  Additionally, no variants of uncertain significance (VUSes) were found.  The test report will be scanned into EPIC and will be located under the Results Review tab in the Pathology>Molecular Pathology section.   We discussed with [name] that since the current genetic testing is not perfect, it is possible there may be a gene mutation in one of these genes that current testing cannot detect, but that chance is small. We also discussed, that it is possible that another gene that has not yet been discovered, or that we have not yet tested, is responsible for the cancer diagnoses in the family, and it is, therefore, important to remain in  touch with cancer genetics in the future so that we can continue to offer [name] the most up-to-date genetic testing.   CANCER SCREENING RECOMMENDATIONS: This negative result may be reassuring for [place], but it cannot rule out a genetic cause for the personal and family history of colon polyps and the family history of colon cancer.  Because, [name] herself has not been diagnosed with colon cancer, but there is a history of colon cancer in her mother and maternal aunt (diagnosed in their 84s and at 20, respectively), there may be a genetic mutation that explains the family history that [name] herself did not inherit.  This result may also simply reflect our current inability to detect all mutations within these genes or there may be a different gene that has not yet been discovered or tested.  Other family affected family members should have genetic counseling and testing, in order to help further elucidate genetic cancer risks.    [name] should continue to follow the cancer screening recommendations of her providers.    RECOMMENDATIONS FOR FAMILY MEMBERS: Women in this family might be at some increased risk of developing cancer, over the general population risk, simply due to the family history of cancer. We recommended women in this family have a yearly mammogram beginning at age 58, or 53 years younger than the earliest onset of cancer, an an annual clinical breast exam, and perform monthly breast self-exams. Women in this family should also have a gynecological exam as recommended by their primary provider. All family members should have a colonoscopy by age 2 or potentially starting at 86 or 14 years younger  than the earliest colorectal cancer diagnosis, if colorectal cancer present in 1st degree relatives.  Based on [name] family history, we recommended her mother, who was diagnosed with colon cancer in her 6s (and whose late sister had colon cancer at the age of 23) have genetic counseling and  testing.  If [name] mother is not interested in having genetic testing, we recommended that [name] sister, who has a history of multiple colon polyps also have genetic counseling and testing.  Additionally, [name] maternal first cousins (sons of the late aunt with colon cancer at 77) may be eligible for genetic counseling and testing to further identify their own hereditary cancer risks.  [name] will let us know if we can be of any assistance in coordinating genetic counseling and/or testing for these family members.  Family members outside of the local area can use the [employer] ([email or web address]) to search for a cancer Dietitian by zip code.     FOLLOW-UP: Lastly, we discussed with [name] that cancer genetics is a rapidly advancing field and it is possible that new genetic tests will be appropriate for her and/or her family members in the future. We encouraged her to remain in contact with cancer genetics on an annual basis so we can update her personal and family histories and let her know of advances in cancer genetics that may benefit this family.   Our contact number was provided. [name] questions were answered to her satisfaction, and she knows she is welcome to call us at anytime with additional questions or concerns.   Jeanine Luz, MS Genetic Counselor [email or web address] Phone: [phone]

## 2015-12-18 NOTE — Telephone Encounter (Signed)
Discussed with [name] that her genetic test results were negative for pathogenic mutations within any of 19 genes that would cause her to be at an increased genetic risk for colon polyps and colon cancer.  Additionally, no uncertain changes were found.  Discussed that this result could mean a few things.  It could mean that there is no genetic cause for the cancers and the colon polyps in the family, that all of these have been sporadic/non-genetic occurrences.  However, it does not rule out a genetic cause, since there could be a genetic mutation explaining the history of colon cancer, that [name] herself just did not inherit, or our testing may not be good enough to identify a genetic cause for the history of colon polyps and colon cancer.  Discussed that testing other family members would be helpful.  [name] mother would be the best person to test next.  She has a history of colon cancer, so she would be the most informative relative.  If [name] mother does not wish to have genetic counseling and testing, [name] sister could also likely have genetic counseling and testing.  Also discussed that the maternal first cousins (sons of maternal aunt who had colon cancer at 81) could have genetic counseling and testing.  [name] will pass this information along.  Encouraged [name] to continue to follow her doctor's recommendations for cancer screening.  Family members who have not yet had a colonoscopy should do so starting at the age of 4 or at the age of 57/10 years younger than earliest diagnosis considering appropriate family history.  Also encouraged [name] to keep in contact with [place] periodically to update the family history and to find out about any updated testing options.  She and her family members are welcome to call with any further questions.

## 2016-09-16 ENCOUNTER — Other Ambulatory Visit: Payer: Self-pay | Admitting: Gynecology

## 2016-09-16 DIAGNOSIS — Z1231 Encounter for screening mammogram for malignant neoplasm of breast: Secondary | ICD-10-CM

## 2016-10-02 ENCOUNTER — Ambulatory Visit
Admission: RE | Admit: 2016-10-02 | Discharge: 2016-10-02 | Disposition: A | Discharge status: Home / Self Care | Payer: 59 | Source: Ambulatory Visit | Attending: Gynecology | Admitting: Gynecology

## 2016-10-02 DIAGNOSIS — Z1231 Encounter for screening mammogram for malignant neoplasm of breast: Secondary | ICD-10-CM

## 2016-11-18 ENCOUNTER — Encounter: Payer: Self-pay | Admitting: Gynecology

## 2016-11-18 ENCOUNTER — Ambulatory Visit (INDEPENDENT_AMBULATORY_CARE_PROVIDER_SITE_OTHER): Payer: 59 | Admitting: Gynecology

## 2016-11-18 VITALS — BP 128/86 | Ht 65.5 in | Wt 194.0 lb

## 2016-11-18 DIAGNOSIS — Z01419 Encounter for gynecological examination (general) (routine) without abnormal findings: Secondary | ICD-10-CM | POA: Diagnosis not present

## 2016-11-18 LAB — CBC WITH DIFFERENTIAL/PLATELET
Basophils Absolute: 0 cells/uL (ref 0–200)
Basophils Relative: 0 %
Eosinophils Absolute: 162 cells/uL (ref 15–500)
Eosinophils Relative: 2 %
HCT: 41.7 % (ref 35.0–45.0)
Hemoglobin: 13.8 g/dL (ref 11.7–15.5)
Lymphocytes Relative: 23 %
Lymphs Abs: 1863 cells/uL (ref 850–3900)
MCH: 29.9 pg (ref 27.0–33.0)
MCHC: 33.1 g/dL (ref 32.0–36.0)
MCV: 90.3 fL (ref 80.0–100.0)
MPV: 9.2 fL (ref 7.5–12.5)
Monocytes Absolute: 648 cells/uL (ref 200–950)
Monocytes Relative: 8 %
Neutro Abs: 5427 cells/uL (ref 1500–7800)
Neutrophils Relative %: 67 %
Platelets: 289 10*3/uL (ref 140–400)
RBC: 4.62 MIL/uL (ref 3.80–5.10)
RDW: 14 % (ref 11.0–15.0)
WBC: 8.1 10*3/uL (ref 3.8–10.8)

## 2016-11-18 LAB — LIPID PANEL
Cholesterol: 170 mg/dL (ref <200)
HDL: 54 mg/dL (ref >50)
LDL Cholesterol: 102 mg/dL — ABNORMAL HIGH (ref <100)
Total CHOL/HDL Ratio: 3.1 Ratio (ref <5.0)
Triglycerides: 70 mg/dL (ref <150)
VLDL: 14 mg/dL (ref <30)

## 2016-11-18 LAB — COMPREHENSIVE METABOLIC PANEL
ALT: 13 U/L (ref 6–29)
AST: 14 U/L (ref 10–35)
Albumin: 4.3 g/dL (ref 3.6–5.1)
Alkaline Phosphatase: 76 U/L (ref 33–115)
BUN: 9 mg/dL (ref 7–25)
CO2: 23 mmol/L (ref 20–31)
Calcium: 8.8 mg/dL (ref 8.6–10.2)
Chloride: 110 mmol/L (ref 98–110)
Creat: 0.76 mg/dL (ref 0.50–1.10)
Glucose, Bld: 89 mg/dL (ref 65–99)
Potassium: 3.8 mmol/L (ref 3.5–5.3)
Sodium: 140 mmol/L (ref 135–146)
Total Bilirubin: 0.4 mg/dL (ref 0.2–1.2)
Total Protein: 6.4 g/dL (ref 6.1–8.1)

## 2016-11-18 LAB — TSH: TSH: 0.66 mIU/L

## 2016-11-18 NOTE — Progress Notes (Signed)
Patricia PROEFROCK 12/31/1970 [id number]   History:    45 y.o.  for annual gyn exam with no complaints today. Patient had a Mirena IUD placed in 2013 and has done well. Patient asymptomatic today. Patient with family history of colon polyps. Patient had a colonoscopy in 2015 and she is on a 5 year recall. Patient with no previous history of any abnormal Pap smear. Patient declined flu vaccine today.  Past medical history,surgical history, family history and social history were all reviewed and documented in the EPIC chart.  Gynecologic History No LMP recorded. Patient is not currently having periods (Reason: IUD). Contraception: IUD Last Pap: 2015. Results were: normal Last mammogram: 2017. Results were: normal  Obstetric History OB History  Gravida Para Term Preterm AB Living  2 1 1   1 1   SAB TAB Ectopic Multiple Live Births  1       1    # Outcome Date GA Lbr Len/2nd Weight Sex Delivery Anes PTL Lv  2 SAB           1 Term     M CS-Unspec  N LIV       ROS: A ROS was performed and pertinent positives and negatives are included in the history.  GENERAL: No fevers or chills. HEENT: No change in vision, no earache, sore throat or sinus congestion. NECK: No pain or stiffness. CARDIOVASCULAR: No chest pain or pressure. No palpitations. PULMONARY: No shortness of breath, cough or wheeze. GASTROINTESTINAL: No abdominal pain, nausea, vomiting or diarrhea, melena or bright red blood per rectum. GENITOURINARY: No urinary frequency, urgency, hesitancy or dysuria. MUSCULOSKELETAL: No joint or muscle pain, no back pain, no recent trauma. DERMATOLOGIC: No rash, no itching, no lesions. ENDOCRINE: No polyuria, polydipsia, no heat or cold intolerance. No recent change in weight. HEMATOLOGICAL: No anemia or easy bruising or bleeding. NEUROLOGIC: No headache, seizures, numbness, tingling or weakness. PSYCHIATRIC: No depression, no loss of interest in normal activity or change in sleep pattern.      Exam: chaperone present  BP 128/86   Ht 5' 5.5" (1.664 m)   Wt 194 lb (88 kg)   BMI 31.79 kg/m   Body mass index is 31.79 kg/m.  General appearance : Well developed well nourished female. No acute distress HEENT: Eyes: no retinal hemorrhage or exudates,  Neck supple, trachea midline, no carotid bruits, no thyroidmegaly Lungs: Clear to auscultation, no rhonchi or wheezes, or rib retractions  Heart: Regular rate and rhythm, no murmurs or gallops Breast:Examined in sitting and supine position were symmetrical in appearance, no palpable masses or tenderness,  no skin retraction, no nipple inversion, no nipple discharge, no skin discoloration, no axillary or supraclavicular lymphadenopathy Abdomen: no palpable masses or tenderness, no rebound or guarding Extremities: no edema or skin discoloration or tenderness  Pelvic:  Bartholin, Urethra, Skene Glands: Within normal limits             Vagina: No gross lesions or discharge  Cervix: No gross lesions or discharge  Uterus  anteverted, normal size, shape and consistency, non-tender and mobile  Adnexa  Without masses or tenderness  Anus and perineum  normal   Rectovaginal  normal sphincter tone without palpated masses or tenderness             Hemoccult cards provided     Assessment/Plan:  45 y.o. female for annual exam will be provided with fecal Hemoccult cards to submit to the office for testing in between every 5  year colonoscopy for close surveillance due to strong family history of colon polyps. The following screening blood work was ordered today: Comprehensive metabolic panel, fasting lipid profile, TSH, CBC, and urinalysis. Patient declined flu vaccine today. We discussed importance of monthly breast exams.   Terrance Mass MD, 9:21 AM 11/18/2016

## 2016-11-19 LAB — URINALYSIS W MICROSCOPIC + REFLEX CULTURE
Bilirubin Urine: NEGATIVE
Casts: NONE SEEN [LPF]
Glucose, UA: NEGATIVE
Hgb urine dipstick: NEGATIVE
Ketones, ur: NEGATIVE
Nitrite: NEGATIVE
Protein, ur: NEGATIVE
Specific Gravity, Urine: 1.018 (ref 1.001–1.035)
Yeast: NONE SEEN [HPF]
pH: 6 (ref 5.0–8.0)

## 2016-11-20 LAB — URINE CULTURE: Organism ID, Bacteria: NO GROWTH

## 2016-12-31 DIAGNOSIS — Z20828 Contact with and (suspected) exposure to other viral communicable diseases: Secondary | ICD-10-CM | POA: Diagnosis not present

## 2016-12-31 DIAGNOSIS — R6889 Other general symptoms and signs: Secondary | ICD-10-CM | POA: Diagnosis not present

## 2017-01-02 ENCOUNTER — Ambulatory Visit (INDEPENDENT_AMBULATORY_CARE_PROVIDER_SITE_OTHER): Payer: 59 | Admitting: Internal Medicine

## 2017-01-02 DIAGNOSIS — R5383 Other fatigue: Secondary | ICD-10-CM | POA: Diagnosis not present

## 2017-01-02 DIAGNOSIS — M791 Myalgia: Secondary | ICD-10-CM | POA: Diagnosis not present

## 2017-01-02 DIAGNOSIS — R51 Headache: Secondary | ICD-10-CM

## 2017-01-02 DIAGNOSIS — R6889 Other general symptoms and signs: Secondary | ICD-10-CM | POA: Insufficient documentation

## 2017-01-02 MED ORDER — ACETAMINOPHEN 325 MG PO TABS: 650.0000 mg | ORAL_TABLET | Freq: Four times a day (QID) | ORAL | 0 refills | Status: DC | PRN

## 2017-01-02 NOTE — Progress Notes (Signed)
   CC: Body aches  HPI:  [name] is a 46 y.o. woman presenting to clinic for diffuse body aches ongoing since about one week ago.  See problem based assessment and plan below for additional details  Past Medical History:  Diagnosis Date  . Anxiety   . Migraines   . Polyp of colon 03/11/2007   BENIGN    Review of Systems:  Review of Systems  Constitutional: Positive for fever and malaise/fatigue.  HENT: Positive for sore throat. Negative for sinus pain.   Respiratory: Negative for sputum production and shortness of breath.   Gastrointestinal: Negative for diarrhea.  Genitourinary: Negative for dysuria.  Skin: Positive for rash.    Physical Exam: Physical Exam  Constitutional: She is well-developed, well-nourished, and in no distress.  HENT:  Head: Normocephalic and atraumatic.  TMs clear  Eyes: Conjunctivae are normal.  Cardiovascular: Normal rate and regular rhythm.   Pulmonary/Chest: Effort normal and breath sounds normal.  Abdominal: Soft. There is no tenderness.  Musculoskeletal: She exhibits no edema.  Lymphadenopathy:    She has no cervical adenopathy.  Skin:  Blanching nontender macular rash over face and upper chest and back    Vitals:   01/02/17 1017  BP: 129/73  Pulse: 84  Temp: 98.4 F (36.9 C)  TempSrc: Oral  SpO2: 100%  Weight: 195 lb 6.4 oz (88.6 kg)  Height: 5\' 6"  (1.676 m)    Assessment & Plan:   See Encounters Tab for problem based charting.  Patient discussed with Dr. Dareen Piano

## 2017-01-02 NOTE — Patient Instructions (Signed)
It was a pleasure to see you today Patricia Erickson.  I think you are suffering from a viral infection. These symptoms will typically last for a total of 10-14 days before you are back to normal, so you are right in the midst of it.  I recommend taking tylenol 650mg  (2 regular strength tylenol) up to every 6 hours for body aches while this is ongoing.  You are already doing a good job staying hydrated which is important.  Cover your cough if any and good hand hygiene should minimize any risk of spreading an infection to others. Please call back or return to clinic if it is not improving by sometime next week.

## 2017-01-05 NOTE — Assessment & Plan Note (Signed)
HPI: Starting 4 days ago she woke up with severe body aches all over. This was accompanied with fatigue that is unusual for her. Her headache does not feel like her typical migraine headache. She went to Urgent care 2 days ago for this same problem and was given tamiflu at that time. She did not start taking the tamiflu because she is not convinced it is beneficial. The influenza test at that visit was negative. 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 She has taken some vitamin C supplements and drinks lots of water otherwise tried no treatments at home.  A: Her fatigue and aches are due to an acute viral infection. Her mild rash today is consistent with a viral exanthem. Supportive care is appropriate as this process should typically be self limited.  P: -Recommend tylenol 650mg  QID PRN for aches and fever -Instructed to contact us if symptoms not improving within about 10 days

## 2017-01-06 NOTE — Progress Notes (Signed)
[facility] Medicine Clinic Attending  Case discussed with Dr. Rice at the time of the visit.  We reviewed the resident's history and exam and pertinent patient test results.  I agree with the assessment, diagnosis, and plan of care documented in the resident's note.  

## 2017-02-26 DIAGNOSIS — R42 Dizziness and giddiness: Secondary | ICD-10-CM | POA: Diagnosis not present

## 2017-02-26 DIAGNOSIS — J0100 Acute maxillary sinusitis, unspecified: Secondary | ICD-10-CM | POA: Diagnosis not present

## 2017-03-03 DIAGNOSIS — E669 Obesity, unspecified: Secondary | ICD-10-CM | POA: Insufficient documentation

## 2017-03-03 DIAGNOSIS — Z136 Encounter for screening for cardiovascular disorders: Secondary | ICD-10-CM | POA: Diagnosis not present

## 2017-03-12 ENCOUNTER — Ambulatory Visit (INDEPENDENT_AMBULATORY_CARE_PROVIDER_SITE_OTHER): Payer: 59 | Admitting: Gynecology

## 2017-03-12 ENCOUNTER — Encounter: Payer: Self-pay | Admitting: Gynecology

## 2017-03-12 VITALS — BP 122/86 | Ht 66.0 in | Wt 198.2 lb

## 2017-03-12 DIAGNOSIS — N760 Acute vaginitis: Secondary | ICD-10-CM

## 2017-03-12 DIAGNOSIS — B9689 Other specified bacterial agents as the cause of diseases classified elsewhere: Secondary | ICD-10-CM | POA: Diagnosis not present

## 2017-03-12 LAB — WET PREP FOR TRICH, YEAST, CLUE
Trich, Wet Prep: NONE SEEN
Yeast Wet Prep HPF POC: NONE SEEN

## 2017-03-12 MED ORDER — TINIDAZOLE 500 MG PO TABS: ORAL_TABLET | ORAL | 0 refills | Status: DC

## 2017-03-12 NOTE — Progress Notes (Signed)
   Patient is a 46 year old that presented to the office today complaining of persistent vaginal irritation but no odor and slight discharge. Patient had been on Augmentin for 10 days prescribed by her PCP as a result of sinus infection and ear infection. She had Diflucan at home and took 1 tablet and repeated several days later with no resolution to her symptoms and she went and purchased Monistat 1 day vaginal cream and still no improvement her symptoms. She is in a monogamous relationship and she has a Mirena IUD for contraception reports normal menstrual irregularity and no dysuria, frequency, fever, or any back pain.  Exam: Abdomen: Soft nontender no rebound or guarding Pelvic: Bartholin urethra Skene was within normal limits Vagina clear discharge but mostly the Monistat cream that she applied last night was present Cervix no gross lesions on inspection Bimanual exam not done Rectal exam: Not done  Wet prep moderate clue cells, too numerous to count bacteria, few white blood cell and cream was noted.  Assessment/plan: Patient with clinical evidence of bacterial vaginosis will be treated with Tindamax 500 mg tablet. She will take 4 tablets today repeat in 24 hours.

## 2017-03-12 NOTE — Patient Instructions (Signed)
Tinidazole tablets What is this medicine? TINIDAZOLE (tye NI da zole) is an antiinfective. It is used to treat amebiasis, giardiasis, trichomoniasis, and vaginosis. It will not work for colds, flu, or other viral infections. This medicine may be used for other purposes; ask your health care provider or pharmacist if you have questions. COMMON BRAND NAME(S): Tindamax What should I tell my health care provider before I take this medicine? They need to know if you have any of these conditions: -anemia or other blood disorders -if you frequently drink alcohol containing drinks -receiving hemodialysis -seizure disorder -an unusual or allergic reaction to tinidazole, other medicines, foods, dyes, or preservatives -pregnant or trying to get pregnant -breast-feeding How should I use this medicine? Take this medicine by mouth with a full glass of water. Follow the directions on the prescription label. Take with food. Take your medicine at regular intervals. Do not take your medicine more often than directed. Take all of your medicine as directed even if you think you are better. Do not skip doses or stop your medicine early. Talk to your pediatrician regarding the use of this medicine in children. While this drug may be prescribed for children as young as 50 years of age for selected conditions, precautions do apply. Overdosage: If you think you have taken too much of this medicine contact a poison control center or emergency room at once. NOTE: This medicine is only for you. Do not share this medicine with others. What if I miss a dose? If you miss a dose, take it as soon as you can. If it is almost time for your next dose, take only that dose. Do not take double or extra doses. What may interact with this medicine? Do not take this medicine with any of the following medications: -alcohol or any product that contains alcohol -amprenavir oral solution -disulfiram -paclitaxel injection -ritonavir  oral solution -sertraline oral solution -sulfamethoxazole-trimethoprim injection This medicine may also interact with the following medications: -cholestyramine -cimetidine -conivaptan -cyclosporin -fluorouracil -fosphenytoin, phenytoin -ketoconazole -lithium -phenobarbital -tacrolimus -warfarin This list may not describe all possible interactions. Give your health care provider a list of all the medicines, herbs, non-prescription drugs, or dietary supplements you use. Also tell them if you smoke, drink alcohol, or use illegal drugs. Some items may interact with your medicine. What should I watch for while using this medicine? Tell your doctor or health care professional if your symptoms do not improve or if they get worse. Avoid alcoholic drinks while you are taking this medicine and for three days afterward. Alcohol may make you feel dizzy, sick, or flushed. If you are being treated for a sexually transmitted disease, avoid sexual contact until you have finished your treatment. Your sexual partner may also need treatment. What side effects may I notice from receiving this medicine? Side effects that you should report to your doctor or health care professional as soon as possible: -allergic reactions like skin rash, itching or hives, swelling of the face, lips, or tongue -breathing problems -confusion, depression -dark or white patches in the mouth -feeling faint or lightheaded, falls -fever, infection -numbness, tingling, pain or weakness in the hands or feet -pain when passing urine -seizures -unusually weak or tired -vaginal irritation or discharge -vomiting Side effects that usually do not require medical attention (report to your doctor or health care professional if they continue or are bothersome): -dark brown or reddish urine -diarrhea -headache -loss of appetite -metallic taste -nausea -stomach upset This list may not describe all  possible side effects. Call your  doctor for medical advice about side effects. You may report side effects to FDA at 1-800-FDA-1088. Where should I keep my medicine? Keep out of the reach of children. Store at room temperature between 15 and 30 degrees C (59 and 86 degrees F). Protect from light and moisture. Keep container tightly closed. Throw away any unused medicine after the expiration date. NOTE: This sheet is a summary. It may not cover all possible information. If you have questions about this medicine, talk to your doctor, pharmacist, or health care provider.  2018 Elsevier/Gold Standard (2008-08-22 15:22:28) Bacterial Vaginosis Bacterial vaginosis is a vaginal infection that occurs when the normal balance of bacteria in the vagina is disrupted. It results from an overgrowth of certain bacteria. This is the most common vaginal infection among women ages 41-44. Because bacterial vaginosis increases your risk for STIs (sexually transmitted infections), getting treated can help reduce your risk for chlamydia, gonorrhea, herpes, and HIV (human immunodeficiency virus). Treatment is also important for preventing complications in pregnant women, because this condition can cause an early (premature) delivery. What are the causes? This condition is caused by an increase in harmful bacteria that are normally present in small amounts in the vagina. However, the reason that the condition develops is not fully understood. What increases the risk? The following factors may make you more likely to develop this condition:  Having a new sexual partner or multiple sexual partners.  Having unprotected sex.  Douching.  Having an intrauterine device (IUD).  Smoking.  Drug and alcohol abuse.  Taking certain antibiotic medicines.  Being pregnant. You cannot get bacterial vaginosis from toilet seats, bedding, swimming pools, or contact with objects around you. What are the signs or symptoms? Symptoms of this condition  include:  Grey or white vaginal discharge. The discharge can also be watery or foamy.  A fish-like odor with discharge, especially after sexual intercourse or during menstruation.  Itching in and around the vagina.  Burning or pain with urination. Some women with bacterial vaginosis have no signs or symptoms. How is this diagnosed? This condition is diagnosed based on:  Your medical history.  A physical exam of the vagina.  Testing a sample of vaginal fluid under a microscope to look for a large amount of bad bacteria or abnormal cells. Your health care provider may use a cotton swab or a small wooden spatula to collect the sample. How is this treated? This condition is treated with antibiotics. These may be given as a pill, a vaginal cream, or a medicine that is put into the vagina (suppository). If the condition comes back after treatment, a second round of antibiotics may be needed. Follow these instructions at home: Medicines   Take over-the-counter and prescription medicines only as told by your health care provider.  Take or use your antibiotic as told by your health care provider. Do not stop taking or using the antibiotic even if you start to feel better. General instructions   If you have a female sexual partner, tell her that you have a vaginal infection. She should see her health care provider and be treated if she has symptoms. If you have a female sexual partner, he does not need treatment.  During treatment:  Avoid sexual activity until you finish treatment.  Do not douche.  Avoid alcohol as directed by your health care provider.  Avoid breastfeeding as directed by your health care provider.  Drink enough water and fluids to keep your  urine clear or pale yellow.  Keep the area around your vagina and rectum clean.  Wash the area daily with warm water.  Wipe yourself from front to back after using the toilet.  Keep all follow-up visits as told by your health  care provider. This is important. How is this prevented?  Do not douche.  Wash the outside of your vagina with warm water only.  Use protection when having sex. This includes latex condoms and dental dams.  Limit how many sexual partners you have. To help prevent bacterial vaginosis, it is best to have sex with just one partner (monogamous).  Make sure you and your sexual partner are tested for STIs.  Wear cotton or cotton-lined underwear.  Avoid wearing tight pants and pantyhose, especially during summer.  Limit the amount of alcohol that you drink.  Do not use any products that contain nicotine or tobacco, such as cigarettes and e-cigarettes. If you need help quitting, ask your health care provider.  Do not use illegal drugs. Where to find more information:  Centers for Disease Control and Prevention: AppraiserFraud.fi  American Sexual Health Association (ASHA): www.ashastd.org  [place] Department of Health and Financial controller, Office on Women's Health: DustingSprays.pl or SecuritiesCard.it Contact a health care provider if:  Your symptoms do not improve, even after treatment.  You have more discharge or pain when urinating.  You have a fever.  You have pain in your abdomen.  You have pain during sex.  You have vaginal bleeding between periods. Summary  Bacterial vaginosis is a vaginal infection that occurs when the normal balance of bacteria in the vagina is disrupted.  Because bacterial vaginosis increases your risk for STIs (sexually transmitted infections), getting treated can help reduce your risk for chlamydia, gonorrhea, herpes, and HIV (human immunodeficiency virus). Treatment is also important for preventing complications in pregnant women, because the condition can cause an early (premature) delivery.  This condition is treated with antibiotic medicines. These may be given as a pill, a vaginal cream, or a medicine  that is put into the vagina (suppository). This information is not intended to replace advice given to you by your health care provider. Make sure you discuss any questions you have with your health care provider. Document Released: 11/25/2005 Document Revised: 08/10/2016 Document Reviewed: 08/10/2016 Elsevier Interactive Patient Education  2017 [employer].

## 2017-03-19 ENCOUNTER — Encounter: Payer: Self-pay | Admitting: Gynecology

## 2017-03-19 ENCOUNTER — Ambulatory Visit (INDEPENDENT_AMBULATORY_CARE_PROVIDER_SITE_OTHER): Payer: 59 | Admitting: Gynecology

## 2017-03-19 VITALS — BP 110/82 | Ht 66.0 in | Wt 197.4 lb

## 2017-03-19 DIAGNOSIS — N760 Acute vaginitis: Secondary | ICD-10-CM

## 2017-03-19 DIAGNOSIS — B3731 Acute candidiasis of vulva and vagina: Secondary | ICD-10-CM

## 2017-03-19 DIAGNOSIS — N898 Other specified noninflammatory disorders of vagina: Secondary | ICD-10-CM | POA: Diagnosis not present

## 2017-03-19 DIAGNOSIS — N949 Unspecified condition associated with female genital organs and menstrual cycle: Secondary | ICD-10-CM | POA: Diagnosis not present

## 2017-03-19 DIAGNOSIS — B9689 Other specified bacterial agents as the cause of diseases classified elsewhere: Secondary | ICD-10-CM

## 2017-03-19 DIAGNOSIS — N9489 Other specified conditions associated with female genital organs and menstrual cycle: Secondary | ICD-10-CM

## 2017-03-19 DIAGNOSIS — B373 Candidiasis of vulva and vagina: Secondary | ICD-10-CM

## 2017-03-19 LAB — WET PREP FOR TRICH, YEAST, CLUE: Trich, Wet Prep: NONE SEEN

## 2017-03-19 MED ORDER — FLUCONAZOLE 150 MG PO TABS: ORAL_TABLET | ORAL | 0 refills | Status: DC

## 2017-03-19 MED ORDER — TERCONAZOLE 0.4 % VA CREA: 1.0000 | TOPICAL_CREAM | Freq: Every day | VAGINAL | 0 refills | Status: DC

## 2017-03-19 MED ORDER — METRONIDAZOLE 500 MG PO TABS: 500.0000 mg | ORAL_TABLET | Freq: Two times a day (BID) | ORAL | 0 refills | Status: DC

## 2017-03-19 NOTE — Patient Instructions (Signed)
Fluconazole tablets What is this medicine? FLUCONAZOLE (floo KON na zole) is an antifungal medicine. It is used to treat certain kinds of fungal or yeast infections. This medicine may be used for other purposes; ask your health care provider or pharmacist if you have questions. COMMON BRAND NAME(S): Diflucan What should I tell my health care provider before I take this medicine? They need to know if you have any of these conditions: -history of irregular heart beat -kidney disease -an unusual or allergic reaction to fluconazole, other azole antifungals, medicines, foods, dyes, or preservatives -pregnant or trying to get pregnant -breast-feeding How should I use this medicine? Take this medicine by mouth. Follow the directions on the prescription label. Do not take your medicine more often than directed. Talk to your pediatrician regarding the use of this medicine in children. Special care may be needed. This medicine has been used in children as young as 15 months of age. Overdosage: If you think you have taken too much of this medicine contact a poison control center or emergency room at once. NOTE: This medicine is only for you. Do not share this medicine with others. What if I miss a dose? If you miss a dose, take it as soon as you can. If it is almost time for your next dose, take only that dose. Do not take double or extra doses. What may interact with this medicine? Do not take this medicine with any of the following medications: -astemizole -certain medicines for irregular heart beat like dofetilide, dronedarone, quinidine -cisapride -erythromycin -lomitapide -other medicines that prolong the QT interval (cause an abnormal heart rhythm) -pimozide -terfenadine -thioridazine -tolvaptan -ziprasidone This medicine may also interact with the following medications: -antiviral medicines for HIV or AIDS -birth control pills -certain antibiotics like rifabutin, rifampin -certain  medicines for blood pressure like amlodipine, isradipine, felodipine, hydrochlorothiazide, losartan, nifedipine -certain medicines for cancer like cyclophosphamide, vinblastine, vincristine -certain medicines for cholesterol like atorvastatin, lovastatin, fluvastatin, simvastatin -certain medicines for depression, anxiety, or psychotic disturbances like amitriptyline, midazolam, nortriptyline, triazolam -certain medicines for diabetes like glipizide, glyburide, tolbutamide -certain medicines for pain like alfentanil, fentanyl, methadone -certain medicines for seizures like carbamazepine, phenytoin -certain medicines that treat or prevent blood clots like warfarin -halofantrine -medicines that lower your chance of fighting infection like cyclosporine, prednisone, tacrolimus -NSAIDS, medicines for pain and inflammation, like celecoxib, diclofenac, flurbiprofen, ibuprofen, meloxicam, naproxen -other medicines for fungal infections -sirolimus -theophylline -tofacitinib This list may not describe all possible interactions. Give your health care provider a list of all the medicines, herbs, non-prescription drugs, or dietary supplements you use. Also tell them if you smoke, drink alcohol, or use illegal drugs. Some items may interact with your medicine. What should I watch for while using this medicine? Visit your doctor or health care professional for regular checkups. If you are taking this medicine for a long time you may need blood work. Tell your doctor if your symptoms do not improve. Some fungal infections need many weeks or months of treatment to cure. Alcohol can increase possible damage to your liver. Avoid alcoholic drinks. If you have a vaginal infection, do not have sex until you have finished your treatment. You can wear a sanitary napkin. Do not use tampons. Wear freshly washed cotton, not synthetic, panties. What side effects may I notice from receiving this medicine? Side effects that  you should report to your doctor or health care professional as soon as possible: -allergic reactions like skin rash or itching, hives, swelling of the  lips, mouth, tongue, or throat -dark urine -feeling dizzy or faint -irregular heartbeat or chest pain -redness, blistering, peeling or loosening of the skin, including inside the mouth -trouble breathing -unusual bruising or bleeding -vomiting -yellowing of the eyes or skin Side effects that usually do not require medical attention (report to your doctor or health care professional if they continue or are bothersome): -changes in how food tastes -diarrhea -headache -stomach upset or nausea This list may not describe all possible side effects. Call your doctor for medical advice about side effects. You may report side effects to FDA at 1-800-FDA-1088. Where should I keep my medicine? Keep out of the reach of children. Store at room temperature below 30 degrees C (86 degrees F). Throw away any medicine after the expiration date. NOTE: This sheet is a summary. It may not cover all possible information. If you have questions about this medicine, talk to your doctor, pharmacist, or health care provider.  2018 Elsevier/Gold Standard (2013-07-03 19:37:38) Terconazole vaginal cream What is this medicine? TERCONAZOLE (ter KON a zole) is an antifungal medicine. It is used to treat yeast infections of the vagina. This medicine may be used for other purposes; ask your health care provider or pharmacist if you have questions. COMMON BRAND NAME(S): Terazol 3, Terazol 7, Zazole What should I tell my health care provider before I take this medicine? They need to know if you have any of these conditions: -an unusual or allergic reaction to terconazole, other antifungals, other medicines, foods, dyes or preservatives -pregnant or trying to get pregnant -breast-feeding How should I use this medicine? This medicine is only for use in the vagina. Do not  take by mouth. Wash hands before and after use. Read package directions carefully before using. Use this medicine at bedtime, unless otherwise directed by your doctor or health care professional. Screw the applicator onto the end of the tube and squeeze the tube to fill the applicator. Remove the applicator from the tube. Lie on your back. Gently insert the applicator tip high in the vagina and push the plunger to release the cream into the vagina. Gently remove the applicator. Wash the applicator well with warm water and soap. Use at regular intervals. Do not get this medicine in your eyes. If you do, rinse out with plenty of cool tap water. Finish the full course prescribed by your doctor or health care professional even if you think your condition is better. Do not stop using this medicine if your menstrual period starts during the time of treatment. Talk to your pediatrician regarding the use of this medicine in children. Special care may be needed. Overdosage: If you think you have taken too much of this medicine contact a poison control center or emergency room at once. NOTE: This medicine is only for you. Do not share this medicine with others. What if I miss a dose? If you miss a dose, use it as soon as you can. If it is almost time for your next dose, use only that dose. Do not use double or extra doses. What may interact with this medicine? Interactions are not expected. Do not use any other vaginal products without telling your doctor or health care professional. This list may not describe all possible interactions. Give your health care provider a list of all the medicines, herbs, non-prescription drugs, or dietary supplements you use. Also tell them if you smoke, drink alcohol, or use illegal drugs. Some items may interact with your medicine. What should I  watch for while using this medicine? Tell your doctor or health care professional if your symptoms do not start to get better within a few  days. It is better not to have sex until you have finished your treatment. If you have sex, your partner should use a condom during sex to help prevent transfer of the infection. Your sexual partner may also need treatment. Vaginal medicines usually will come out of the vagina during treatment. To keep the medicine from getting on your clothing, wear a mini-pad or sanitary napkin. The use of tampons is not recommended since they may soak up the medicine. To help clear up the infection, wear freshly washed cotton, not synthetic, underwear. What side effects may I notice from receiving this medicine? Side effects that you should report to your doctor or health care professional as soon as possible: -painful or difficult urination -vaginal pain Side effects that usually do not require medical attention (report to your doctor or health care professional if they continue or are bothersome): -headache -menstrual pain -stomach upset -vaginal irritation, itching or burning This list may not describe all possible side effects. Call your doctor for medical advice about side effects. You may report side effects to FDA at 1-800-FDA-1088. Where should I keep my medicine? Keep out of the reach of children. Store at room temperature between 15 and 30 degrees C (59 and 86 degrees F). Throw away any unused medicine after the expiration date. NOTE: This sheet is a summary. It may not cover all possible information. If you have questions about this medicine, talk to your doctor, pharmacist, or health care provider.  2018 Elsevier/Gold Standard (2008-08-10 13:51:27) Bacterial Vaginosis Bacterial vaginosis is a vaginal infection that occurs when the normal balance of bacteria in the vagina is disrupted. It results from an overgrowth of certain bacteria. This is the most common vaginal infection among women ages 26-44. Because bacterial vaginosis increases your risk for STIs (sexually transmitted infections), getting  treated can help reduce your risk for chlamydia, gonorrhea, herpes, and HIV (human immunodeficiency virus). Treatment is also important for preventing complications in pregnant women, because this condition can cause an early (premature) delivery. What are the causes? This condition is caused by an increase in harmful bacteria that are normally present in small amounts in the vagina. However, the reason that the condition develops is not fully understood. What increases the risk? The following factors may make you more likely to develop this condition:  Having a new sexual partner or multiple sexual partners.  Having unprotected sex.  Douching.  Having an intrauterine device (IUD).  Smoking.  Drug and alcohol abuse.  Taking certain antibiotic medicines.  Being pregnant. You cannot get bacterial vaginosis from toilet seats, bedding, swimming pools, or contact with objects around you. What are the signs or symptoms? Symptoms of this condition include:  Grey or white vaginal discharge. The discharge can also be watery or foamy.  A fish-like odor with discharge, especially after sexual intercourse or during menstruation.  Itching in and around the vagina.  Burning or pain with urination. Some women with bacterial vaginosis have no signs or symptoms. How is this diagnosed? This condition is diagnosed based on:  Your medical history.  A physical exam of the vagina.  Testing a sample of vaginal fluid under a microscope to look for a large amount of bad bacteria or abnormal cells. Your health care provider may use a cotton swab or a small wooden spatula to collect the sample. How is  this treated? This condition is treated with antibiotics. These may be given as a pill, a vaginal cream, or a medicine that is put into the vagina (suppository). If the condition comes back after treatment, a second round of antibiotics may be needed. Follow these instructions at home: Medicines    Take over-the-counter and prescription medicines only as told by your health care provider.  Take or use your antibiotic as told by your health care provider. Do not stop taking or using the antibiotic even if you start to feel better. General instructions   If you have a female sexual partner, tell her that you have a vaginal infection. She should see her health care provider and be treated if she has symptoms. If you have a female sexual partner, he does not need treatment.  During treatment:  Avoid sexual activity until you finish treatment.  Do not douche.  Avoid alcohol as directed by your health care provider.  Avoid breastfeeding as directed by your health care provider.  Drink enough water and fluids to keep your urine clear or pale yellow.  Keep the area around your vagina and rectum clean.  Wash the area daily with warm water.  Wipe yourself from front to back after using the toilet.  Keep all follow-up visits as told by your health care provider. This is important. How is this prevented?  Do not douche.  Wash the outside of your vagina with warm water only.  Use protection when having sex. This includes latex condoms and dental dams.  Limit how many sexual partners you have. To help prevent bacterial vaginosis, it is best to have sex with just one partner (monogamous).  Make sure you and your sexual partner are tested for STIs.  Wear cotton or cotton-lined underwear.  Avoid wearing tight pants and pantyhose, especially during summer.  Limit the amount of alcohol that you drink.  Do not use any products that contain nicotine or tobacco, such as cigarettes and e-cigarettes. If you need help quitting, ask your health care provider.  Do not use illegal drugs. Where to find more information:  Centers for Disease Control and Prevention: AppraiserFraud.fi  American Sexual Health Association (ASHA): www.ashastd.org  U.S. Department of Health and Financial controller,  Office on Women's Health: DustingSprays.pl or SecuritiesCard.it Contact a health care provider if:  Your symptoms do not improve, even after treatment.  You have more discharge or pain when urinating.  You have a fever.  You have pain in your abdomen.  You have pain during sex.  You have vaginal bleeding between periods. Summary  Bacterial vaginosis is a vaginal infection that occurs when the normal balance of bacteria in the vagina is disrupted.  Because bacterial vaginosis increases your risk for STIs (sexually transmitted infections), getting treated can help reduce your risk for chlamydia, gonorrhea, herpes, and HIV (human immunodeficiency virus). Treatment is also important for preventing complications in pregnant women, because the condition can cause an early (premature) delivery.  This condition is treated with antibiotic medicines. These may be given as a pill, a vaginal cream, or a medicine that is put into the vagina (suppository). This information is not intended to replace advice given to you by your health care provider. Make sure you discuss any questions you have with your health care provider. Document Released: 11/25/2005 Document Revised: 08/10/2016 Document Reviewed: 08/10/2016 Elsevier Interactive Patient Education  2017 Elsevier Inc. Vaginal Yeast infection, Adult Vaginal yeast infection is a condition that causes soreness, swelling, and redness (inflammation)  of the vagina. It also causes vaginal discharge. This is a common condition. Some women get this infection frequently. What are the causes? This condition is caused by a change in the normal balance of the yeast (candida) and bacteria that live in the vagina. This change causes an overgrowth of yeast, which causes the inflammation. What increases the risk? This condition is more likely to develop in:  Women who take antibiotic medicines.  Women who have  diabetes.  Women who take birth control pills.  Women who are pregnant.  Women who douche often.  Women who have a weak defense (immune) system.  Women who have been taking steroid medicines for a long time.  Women who frequently wear tight clothing. What are the signs or symptoms? Symptoms of this condition include:  White, thick vaginal discharge.  Swelling, itching, redness, and irritation of the vagina. The lips of the vagina (vulva) may be affected as well.  Pain or a burning feeling while urinating.  Pain during sex. How is this diagnosed? This condition is diagnosed with a medical history and physical exam. This will include a pelvic exam. Your health care provider will examine a sample of your vaginal discharge under a microscope. Your health care provider may send this sample for testing to confirm the diagnosis. How is this treated? This condition is treated with medicine. Medicines may be over-the-counter or prescription. You may be told to use one or more of the following:  Medicine that is taken orally.  Medicine that is applied as a cream.  Medicine that is inserted directly into the vagina (suppository). Follow these instructions at home:  Take or apply over-the-counter and prescription medicines only as told by your health care provider.  Do not have sex until your health care provider has approved. Tell your sex partner that you have a yeast infection. That person should go to his or her health care provider if he or she develops symptoms.  Do not wear tight clothes, such as pantyhose or tight pants.  Avoid using tampons until your health care provider approves.  Eat more yogurt. This may help to keep your yeast infection from returning.  Try taking a sitz bath to help with discomfort. This is a warm water bath that is taken while you are sitting down. The water should only come up to your hips and should cover your buttocks. Do this 3-4 times per day or  as told by your health care provider.  Do not douche.  Wear breathable, cotton underwear.  If you have diabetes, keep your blood sugar levels under control. Contact a health care provider if:  You have a fever.  Your symptoms go away and then return.  Your symptoms do not get better with treatment.  Your symptoms get worse.  You have new symptoms.  You develop blisters in or around your vagina.  You have blood coming from your vagina and it is not your menstrual period.  You develop pain in your abdomen. This information is not intended to replace advice given to you by your health care provider. Make sure you discuss any questions you have with your health care provider. Document Released: 09/04/2005 Document Revised: 05/08/2016 Document Reviewed: 05/29/2015 Elsevier Interactive Patient Education  2017 [employer].

## 2017-03-19 NOTE — Progress Notes (Signed)
   Patient is a 46 year old who was seen in the office approximately 1 week ago complaining of vaginal discharge after she had been on Augmentin for 10 days prescribed by her PCP for sinus and infection and ear infection. She did treat herself at home with Monistat 1 day and Diflucan that she had return to the office and was noted she had bacterial vaginosis and was placed on Tindamax 500 mg tablets 4 daily 2. 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  We are going to obtain a species specific vaginal culture today in the meantime she'll be treated in the following fashion: #1 Flagyl 500 mg twice a day for 7 days #2 Terazol 7 to apply vaginally daily at bedtime 1 week #3 after completing the above she will take a Diflucan 1 by mouth weekly of 150 mg for 2 months  Diagnosis yeast vaginitis and bacterial vaginosis

## 2017-03-24 ENCOUNTER — Telehealth: Payer: Self-pay

## 2017-03-24 DIAGNOSIS — G43719 Chronic migraine without aura, intractable, without status migrainosus: Secondary | ICD-10-CM | POA: Diagnosis not present

## 2017-03-24 NOTE — Telephone Encounter (Signed)
Tell her tostart the Diflucan 150 mg weekly for the three months we had discussed.

## 2017-03-24 NOTE — Telephone Encounter (Signed)
Patient advised.

## 2017-03-24 NOTE — Telephone Encounter (Signed)
Has had two office visits in last two weeks for vaginal sx.  Tomorrow is last day of Metronidazole and Terazol. She said she is still having "major burning" externally. Like on fire.  Mornings are better after shower but as evening approaches always worsens.

## 2017-03-26 LAB — SURESWAB BACTERIAL VAGINOSIS/ITIS
Atopobium vaginae: 5.4 Log (cells/mL)
C. albicans, DNA: NOT DETECTED
C. glabrata, DNA: NOT DETECTED
C. parapsilosis, DNA: NOT DETECTED
C. tropicalis, DNA: NOT DETECTED
Gardnerella vaginalis: >8 Log (cells/mL)
LACTOBACILLUS SPECIES: NOT DETECTED Log (cells/mL)
MEGASPHAERA SPECIES: >8 Log (cells/mL)
T. vaginalis RNA, QL TMA: NOT DETECTED

## 2017-03-27 ENCOUNTER — Telehealth: Payer: Self-pay | Admitting: *Deleted

## 2017-03-27 MED ORDER — CLINDAMYCIN PHOSPHATE 2 % VA CREA: TOPICAL_CREAM | VAGINAL | 0 refills | Status: DC

## 2017-03-27 MED ORDER — CLINDAMYCIN PHOSPHATE 2 % VA CREA
TOPICAL_CREAM | VAGINAL | 2 refills | Status: DC
Start: 2017-03-27 — End: 2017-03-28

## 2017-03-27 NOTE — Telephone Encounter (Signed)
Dr.Fernandez what are you referring to when you say "a repeat pill to apply twice a from one month" are you saying take diflucan twice weekly x1 month now? Please advise

## 2017-03-27 NOTE — Telephone Encounter (Signed)
Rx sent, refills on cleocin sent to pharmacy. Pt aware.

## 2017-03-27 NOTE — Telephone Encounter (Signed)
Instructions for Diflucan 150 mg one by mouth every weekly for 2 months. Cleocin vaginal cuff and before bedtime for 7 days and she week for 2 months as well. Thank you for correcting

## 2017-03-27 NOTE — Telephone Encounter (Signed)
Pt called from [facility] on 03/19/17 now taking diflucan 150 weekly x 3 months, still having external vaginal itching and mild vaginal burning( is better, but not completely gone) pt said a vaginal swab was done at [facility] on 03/19/17 as well, never received results for this. Please advise

## 2017-03-27 NOTE — Telephone Encounter (Signed)
Tell her that I received a culture today and this patient was for also for bacterial vaginosis. Call her in prescription for Cleocin vaginal to apply daily at bedtime for 7 days and also a repeat pill to apply twice a from one month

## 2017-03-28 ENCOUNTER — Telehealth: Payer: Self-pay | Admitting: *Deleted

## 2017-03-28 MED ORDER — CLINDAMYCIN PHOSPHATE 2 % VA CREA: TOPICAL_CREAM | VAGINAL | 2 refills | Status: DC

## 2017-03-28 NOTE — Telephone Encounter (Signed)
Pt called requesting to have clindamycin 2% vaginal cream sent to different pharmacy, from 03/27/17 telephone encounter. Rx sent.

## 2017-03-31 ENCOUNTER — Telehealth: Payer: Self-pay | Admitting: *Deleted

## 2017-03-31 MED ORDER — CLINDAMYCIN PHOSPHATE 2 % VA CREA
TOPICAL_CREAM | VAGINAL | 1 refills | Status: DC
Start: 2017-03-31 — End: 2017-11-21

## 2017-03-31 NOTE — Telephone Encounter (Signed)
Pt was seen on 03/12/17, 03/19/17 for vaginal irration, itching and burning. Pt has taken all prescribed medication, currently using clindamycin 2% at bedtime x 7 nights, then twice weekly x 2 months. Pt said she does not feel any better, same symptoms as noted above. Please advise

## 2017-03-31 NOTE — Telephone Encounter (Signed)
Pt direction for clindamycin vaginal cream prescribed on 03/27/17 should be for 1 application twice weekly x 66months. New Rx sent with directions

## 2017-03-31 NOTE — Telephone Encounter (Signed)
Needs office visit.

## 2017-04-01 NOTE — Telephone Encounter (Signed)
Pt informed

## 2017-04-03 ENCOUNTER — Ambulatory Visit (INDEPENDENT_AMBULATORY_CARE_PROVIDER_SITE_OTHER): Payer: 59 | Admitting: Gynecology

## 2017-04-03 ENCOUNTER — Encounter: Payer: Self-pay | Admitting: Gynecology

## 2017-04-03 ENCOUNTER — Encounter: Payer: 59 | Admitting: Pulmonary Disease

## 2017-04-03 VITALS — BP 124/78

## 2017-04-03 DIAGNOSIS — N9089 Other specified noninflammatory disorders of vulva and perineum: Secondary | ICD-10-CM | POA: Diagnosis not present

## 2017-04-03 LAB — WET PREP FOR TRICH, YEAST, CLUE
Trich, Wet Prep: NONE SEEN
WBC, Wet Prep HPF POC: NONE SEEN
Yeast Wet Prep HPF POC: NONE SEEN

## 2017-04-03 MED ORDER — CLOBETASOL PROPIONATE 0.05 % EX CREA: 1.0000 "application " | TOPICAL_CREAM | Freq: Two times a day (BID) | CUTANEOUS | 2 refills | Status: DC

## 2017-04-03 NOTE — Progress Notes (Signed)
   Patient is a 46 year old presented to the office was again with complaining of vulvar irritation. Review of her record indicated which she was seen on April 11 she has been complaining of vaginal discharge after she completed antibiotic treatment provided by her PCP for a sinus infection. Patient been treated for bacterial vaginosis with Tindamax for 2 days and also been treated for yeast infection with Monistat 1 day as well as Diflucan oral. Because of her recurrent BV she had been placed on Flagyl 500 mg twice a day for 7 days and Terazole cream daily at bedtime at bedtime for one week after completing that treatment she was instructed to take Diflucan 150 mg weekly for 2 months. She didn't call back to playing of similar symptoms and was called in Cleocin vaginal cream and is still having symptoms and is here for follow-up. Review of her record also indicated that she had vaginal cultures species specific and BV was identified.  Exam: With the use of 9 and a final lens external genitalia was inspected she appeared to be irritated and external genitalia but no lesions were noted. The speculum was then placed into the vagina no gross lesions on inspection or prep was obtained.  External wet prep negative just few bacteria  Inside wet prep moderate clue cells too numerous to count bacteria  Assessment/plan: Recalcitrant bacterial vaginosis will be treated with boric acid suppositories 600 mg daily at bedtime for 21 days. For external irritation she'll be prescribed clobetasol 0.05% to apply 2-3 times a week for the next 3 weeks. She will stop the Diflucan as well as the Cleocin vaginal cream and see how she responds. If she continues to have similar symptoms we'll refer to GYN dermatology doctor [facility]

## 2017-04-23 ENCOUNTER — Encounter: Payer: Self-pay | Admitting: Gynecology

## 2017-05-02 DIAGNOSIS — M25512 Pain in left shoulder: Secondary | ICD-10-CM | POA: Diagnosis not present

## 2017-05-02 DIAGNOSIS — G43909 Migraine, unspecified, not intractable, without status migrainosus: Secondary | ICD-10-CM | POA: Diagnosis not present

## 2017-05-16 DIAGNOSIS — M25512 Pain in left shoulder: Secondary | ICD-10-CM | POA: Diagnosis not present

## 2017-05-23 ENCOUNTER — Encounter: Payer: Self-pay | Admitting: *Deleted

## 2017-09-30 ENCOUNTER — Other Ambulatory Visit: Payer: Self-pay | Admitting: Obstetrics & Gynecology

## 2017-09-30 DIAGNOSIS — Z1231 Encounter for screening mammogram for malignant neoplasm of breast: Secondary | ICD-10-CM

## 2017-10-20 ENCOUNTER — Ambulatory Visit
Admission: RE | Admit: 2017-10-20 | Discharge: 2017-10-20 | Disposition: A | Discharge status: Home / Self Care | Payer: 59 | Source: Ambulatory Visit | Attending: Obstetrics & Gynecology | Admitting: Obstetrics & Gynecology

## 2017-10-20 DIAGNOSIS — Z1231 Encounter for screening mammogram for malignant neoplasm of breast: Secondary | ICD-10-CM | POA: Diagnosis not present

## 2017-10-29 DIAGNOSIS — G43719 Chronic migraine without aura, intractable, without status migrainosus: Secondary | ICD-10-CM | POA: Diagnosis not present

## 2017-11-21 ENCOUNTER — Encounter: Payer: Self-pay | Admitting: Obstetrics & Gynecology

## 2017-11-21 ENCOUNTER — Ambulatory Visit (INDEPENDENT_AMBULATORY_CARE_PROVIDER_SITE_OTHER): Payer: 59 | Admitting: Obstetrics & Gynecology

## 2017-11-21 VITALS — BP 126/84 | Ht 65.5 in | Wt 205.0 lb

## 2017-11-21 DIAGNOSIS — Z01419 Encounter for gynecological examination (general) (routine) without abnormal findings: Secondary | ICD-10-CM | POA: Diagnosis not present

## 2017-11-21 DIAGNOSIS — E6609 Other obesity due to excess calories: Secondary | ICD-10-CM

## 2017-11-21 DIAGNOSIS — Z975 Presence of (intrauterine) contraceptive device: Secondary | ICD-10-CM

## 2017-11-21 DIAGNOSIS — Z1151 Encounter for screening for human papillomavirus (HPV): Secondary | ICD-10-CM

## 2017-11-21 DIAGNOSIS — T8332XA Displacement of intrauterine contraceptive device, initial encounter: Secondary | ICD-10-CM

## 2017-11-21 DIAGNOSIS — R7309 Other abnormal glucose: Secondary | ICD-10-CM | POA: Diagnosis not present

## 2017-11-21 DIAGNOSIS — Z6833 Body mass index (BMI) 33.0-33.9, adult: Secondary | ICD-10-CM | POA: Diagnosis not present

## 2017-11-21 NOTE — Progress Notes (Signed)
Patricia Erickson 07-16-1971 [id number]   History:    46 y.o. G2P1A1L1 Divorced.  Boyfriend x 3 years.  Son is 79 yo, in college  RP:  Established patient presenting for annual gyn exam   HPI:  Well on Mirena IUD x 10/2012, scheduled to change it next week.  No abnormal bleeding.  No pelvic pain.  Breasts wnl.  Urine/BMs wnl.  Followed for migraines.  Will do fasting [facility] here today.  BMI 33.59.  Wants to start back with regular physical activity.  Past medical history,surgical history, family history and social history were all reviewed and documented in the EPIC chart.  Gynecologic History No LMP recorded. Patient is not currently having periods (Reason: IUD). Contraception: Mirena IUD x 10/2012 Last Pap: 11/2014. Results were: normal Last mammogram: 10/2017. Results were: Negative Colono 2011 Bone Density 2007  Obstetric History OB History  Gravida Para Term Preterm AB Living  2 1 1   1 1   SAB TAB Ectopic Multiple Live Births  1       1    # Outcome Date GA Lbr Len/2nd Weight Sex Delivery Anes PTL Lv  2 SAB           1 Term     M CS-Unspec  N LIV       ROS: A ROS was performed and pertinent positives and negatives are included in the history.  GENERAL: No fevers or chills. HEENT: No change in vision, no earache, sore throat or sinus congestion. NECK: No pain or stiffness. CARDIOVASCULAR: No chest pain or pressure. No palpitations. PULMONARY: No shortness of breath, cough or wheeze. GASTROINTESTINAL: No abdominal pain, nausea, vomiting or diarrhea, melena or bright red blood per rectum. GENITOURINARY: No urinary frequency, urgency, hesitancy or dysuria. MUSCULOSKELETAL: No joint or muscle pain, no back pain, no recent trauma. DERMATOLOGIC: No rash, no itching, no lesions. ENDOCRINE: No polyuria, polydipsia, no heat or cold intolerance. No recent change in weight. HEMATOLOGICAL: No anemia or easy bruising or bleeding. NEUROLOGIC: No headache, seizures, numbness, tingling or  weakness. PSYCHIATRIC: No depression, no loss of interest in normal activity or change in sleep pattern.     Exam:   Ht 5' 5.5" (1.664 m)   Wt 205 lb (93 kg)   BMI 33.59 kg/m   Body mass index is 33.59 kg/m.  General appearance : Well developed well nourished female. No acute distress HEENT: Eyes: no retinal hemorrhage or exudates,  Neck supple, trachea midline, no carotid bruits, no thyroidmegaly Lungs: Clear to auscultation, no rhonchi or wheezes, or rib retractions  Heart: Regular rate and rhythm, no murmurs or gallops Breast:Examined in sitting and supine position were symmetrical in appearance, no palpable masses or tenderness,  no skin retraction, no nipple inversion, no nipple discharge, no skin discoloration, no axillary or supraclavicular lymphadenopathy Abdomen: no palpable masses or tenderness, no rebound or guarding Extremities: no edema or skin discoloration or tenderness  Pelvic: Vulva normal  Bartholin, Urethra, Skene Glands: Within normal limits             Vagina: No gross lesions or discharge  Cervix: No gross lesions or discharge.  IUD strings not visible.  Pap/HPV HR done.  Uterus  AV, normal size, shape and consistency, non-tender and mobile  Adnexa  Without masses or tenderness  Anus and perineum  normal    Assessment/Plan:  46 y.o. female for annual exam   1. Encounter for routine gynecological examination with Papanicolaou smear of cervix Normal  gynecologic exam.  Pap test with high risk HPV done.  Breast exam normal.  Last mammogram November 2000 18-.  Colonoscopy 2011.  Health labs here today. - CBC - Comp Met (CMET) - Lipid panel; Future - TSH - Vitamin D 1,25 dihydroxy  2. IUD contraception Well with Mirena IUD since November 2013.  Will use condoms until comes back to change the Mirena IUD on December 26.  3. Intrauterine contraceptive device threads lost, initial encounter IUD strings not visible.  Probably felt in the cervical canal.  Pelvic  ultrasound will be available as needed on December 26.  4. Class 1 obesity due to excess calories without serious comorbidity with body mass index (BMI) of 33.0 to 33.9 in adult Body mass index 33.59.  Patient encouraged to start on a low calorie/low carb diet like the [employer].  Regular physical activity encouraged.  Recommend aerobic and weightlifting.  Princess Bruins MD, 8:19 AM 11/21/2017

## 2017-11-21 NOTE — Patient Instructions (Signed)
1. Encounter for routine gynecological examination with Papanicolaou smear of cervix Normal gynecologic exam.  Pap test with high risk HPV done.  Breast exam normal.  Last mammogram November 2000 18-.  Colonoscopy 2011.  Health labs here today. - CBC - Comp Met (CMET) - Lipid panel; Future - TSH - Vitamin D 1,25 dihydroxy  2. IUD contraception Well with Mirena IUD since November 2013.  Will use condoms until comes back to change the Mirena IUD on December 26.  3. Intrauterine contraceptive device threads lost, initial encounter IUD strings not visible.  Probably felt in the cervical canal.  Pelvic ultrasound will be available as needed on December 26.  4. Class 1 obesity due to excess calories without serious comorbidity with body mass index (BMI) of 33.0 to 33.9 in adult Body mass index 33.59.  Patient encouraged to start on a low calorie/low carb diet like the [employer].  Regular physical activity encouraged.  Recommend aerobic and weightlifting.  Patricia Erickson, it was a pleasure meeting you today!  I will inform you of your results as soon as available.  Health Maintenance, Female Adopting a healthy lifestyle and getting preventive care can go a long way to promote health and wellness. Talk with your health care provider about what schedule of regular examinations is right for you. This is a good chance for you to check in with your provider about disease prevention and staying healthy. In between checkups, there are plenty of things you can do on your own. Experts have done a lot of research about which lifestyle changes and preventive measures are most likely to keep you healthy. Ask your health care provider for more information. Weight and diet Eat a healthy diet  Be sure to include plenty of vegetables, fruits, low-fat dairy products, and lean protein.  Do not eat a lot of foods high in solid fats, added sugars, or salt.  Get regular exercise. This is one of the most important  things you can do for your health. ? Most adults should exercise for at least 150 minutes each week. The exercise should increase your heart rate and make you sweat (moderate-intensity exercise). ? Most adults should also do strengthening exercises at least twice a week. This is in addition to the moderate-intensity exercise.  Maintain a healthy weight  Body mass index (BMI) is a measurement that can be used to identify possible weight problems. It estimates body fat based on height and weight. Your health care provider can help determine your BMI and help you achieve or maintain a healthy weight.  For females 17 years of age and older: ? A BMI below 18.5 is considered underweight. ? A BMI of 18.5 to 24.9 is normal. ? A BMI of 25 to 29.9 is considered overweight. ? A BMI of 30 and above is considered obese.  Watch levels of cholesterol and blood lipids  You should start having your blood tested for lipids and cholesterol at 48 years of age, then have this test every 5 years.  You may need to have your cholesterol levels checked more often if: ? Your lipid or cholesterol levels are high. ? You are older than 48 years of age. ? You are at high risk for heart disease.  Cancer screening Lung Cancer  Lung cancer screening is recommended for adults 37-53 years old who are at high risk for lung cancer because of a history of smoking.  A yearly low-dose CT scan of the lungs is recommended for people  who: ? Currently smoke. ? Have quit within the past 15 years. ? Have at least a 30-pack-year history of smoking. A pack year is smoking an average of one pack of cigarettes a day for 1 year.  Yearly screening should continue until it has been 15 years since you quit.  Yearly screening should stop if you develop a health problem that would prevent you from having lung cancer treatment.  Breast Cancer  Practice breast self-awareness. This means understanding how your breasts normally appear  and feel.  It also means doing regular breast self-exams. Let your health care provider know about any changes, no matter how small.  If you are in your 20s or 30s, you should have a clinical breast exam (CBE) by a health care provider every 1-3 years as part of a regular health exam.  If you are 61 or older, have a CBE every year. Also consider having a breast X-ray (mammogram) every year.  If you have a family history of breast cancer, talk to your health care provider about genetic screening.  If you are at high risk for breast cancer, talk to your health care provider about having an MRI and a mammogram every year.  Breast cancer gene (BRCA) assessment is recommended for women who have family members with BRCA-related cancers. BRCA-related cancers include: ? Breast. ? Ovarian. ? Tubal. ? Peritoneal cancers.  Results of the assessment will determine the need for genetic counseling and BRCA1 and BRCA2 testing.  Cervical Cancer Your health care provider may recommend that you be screened regularly for cancer of the pelvic organs (ovaries, uterus, and vagina). This screening involves a pelvic examination, including checking for microscopic changes to the surface of your cervix (Pap test). You may be encouraged to have this screening done every 3 years, beginning at age 47.  For women ages 66-65, health care providers may recommend pelvic exams and Pap testing every 3 years, or they may recommend the Pap and pelvic exam, combined with testing for human papilloma virus (HPV), every 5 years. Some types of HPV increase your risk of cervical cancer. Testing for HPV may also be done on women of any age with unclear Pap test results.  Other health care providers may not recommend any screening for nonpregnant women who are considered low risk for pelvic cancer and who do not have symptoms. Ask your health care provider if a screening pelvic exam is right for you.  If you have had past treatment  for cervical cancer or a condition that could lead to cancer, you need Pap tests and screening for cancer for at least 20 years after your treatment. If Pap tests have been discontinued, your risk factors (such as having a new sexual partner) need to be reassessed to determine if screening should resume. Some women have medical problems that increase the chance of getting cervical cancer. In these cases, your health care provider may recommend more frequent screening and Pap tests.  Colorectal Cancer  This type of cancer can be detected and often prevented.  Routine colorectal cancer screening usually begins at 48 years of age and continues through 48 years of age.  Your health care provider may recommend screening at an earlier age if you have risk factors for colon cancer.  Your health care provider may also recommend using home test kits to check for hidden blood in the stool.  A small camera at the end of a tube can be used to examine your colon directly (sigmoidoscopy  or colonoscopy). This is done to check for the earliest forms of colorectal cancer.  Routine screening usually begins at age 30.  Direct examination of the colon should be repeated every 5-10 years through 48 years of age. However, you may need to be screened more often if early forms of precancerous polyps or small growths are found.  Skin Cancer  Check your skin from head to toe regularly.  Tell your health care provider about any new moles or changes in moles, especially if there is a change in a mole's shape or color.  Also tell your health care provider if you have a mole that is larger than the size of a pencil eraser.  Always use sunscreen. Apply sunscreen liberally and repeatedly throughout the day.  Protect yourself by wearing long sleeves, pants, a wide-brimmed hat, and sunglasses whenever you are outside.  Heart disease, diabetes, and high blood pressure  High blood pressure causes heart disease and  increases the risk of stroke. High blood pressure is more likely to develop in: ? People who have blood pressure in the high end of the normal range (130-139/85-89 mm Hg). ? People who are overweight or obese. ? People who are African American.  If you are 62-26 years of age, have your blood pressure checked every 3-5 years. If you are 6 years of age or older, have your blood pressure checked every year. You should have your blood pressure measured twice-once when you are at a hospital or clinic, and once when you are not at a hospital or clinic. Record the average of the two measurements. To check your blood pressure when you are not at a hospital or clinic, you can use: ? An automated blood pressure machine at a pharmacy. ? A home blood pressure monitor.  If you are between 18 years and 60 years old, ask your health care provider if you should take aspirin to prevent strokes.  Have regular diabetes screenings. This involves taking a blood sample to check your fasting blood sugar level. ? If you are at a normal weight and have a low risk for diabetes, have this test once every three years after 48 years of age. ? If you are overweight and have a high risk for diabetes, consider being tested at a younger age or more often. Preventing infection Hepatitis B  If you have a higher risk for hepatitis B, you should be screened for this virus. You are considered at high risk for hepatitis B if: ? You were born in a country where hepatitis B is common. Ask your health care provider which countries are considered high risk. ? Your parents were born in a high-risk country, and you have not been immunized against hepatitis B (hepatitis B vaccine). ? You have HIV or AIDS. ? You use needles to inject street drugs. ? You live with someone who has hepatitis B. ? You have had sex with someone who has hepatitis B. ? You get hemodialysis treatment. ? You take certain medicines for conditions, including  cancer, organ transplantation, and autoimmune conditions.  Hepatitis C  Blood testing is recommended for: ? Everyone born from [birth year] through 1967. ? Anyone with known risk factors for hepatitis C.  Sexually transmitted infections (STIs)  You should be screened for sexually transmitted infections (STIs) including gonorrhea and chlamydia if: ? You are sexually active and are younger than 48 years of age. ? You are older than 48 years of age and your health care provider tells  you that you are at risk for this type of infection. ? Your sexual activity has changed since you were last screened and you are at an increased risk for chlamydia or gonorrhea. Ask your health care provider if you are at risk.  If you do not have HIV, but are at risk, it may be recommended that you take a prescription medicine daily to prevent HIV infection. This is called pre-exposure prophylaxis (PrEP). You are considered at risk if: ? You are sexually active and do not regularly use condoms or know the HIV status of your partner(s). ? You take drugs by injection. ? You are sexually active with a partner who has HIV.  Talk with your health care provider about whether you are at high risk of being infected with HIV. If you choose to begin PrEP, you should first be tested for HIV. You should then be tested every 3 months for as long as you are taking PrEP. Pregnancy  If you are premenopausal and you may become pregnant, ask your health care provider about preconception counseling.  If you may become pregnant, take 400 to 800 micrograms (mcg) of folic acid every day.  If you want to prevent pregnancy, talk to your health care provider about birth control (contraception). Osteoporosis and menopause  Osteoporosis is a disease in which the bones lose minerals and strength with aging. This can result in serious bone fractures. Your risk for osteoporosis can be identified using a bone density scan.  If you are 13 years  of age or older, or if you are at risk for osteoporosis and fractures, ask your health care provider if you should be screened.  Ask your health care provider whether you should take a calcium or vitamin D supplement to lower your risk for osteoporosis.  Menopause may have certain physical symptoms and risks.  Hormone replacement therapy may reduce some of these symptoms and risks. Talk to your health care provider about whether hormone replacement therapy is right for you. Follow these instructions at home:  Schedule regular health, dental, and eye exams.  Stay current with your immunizations.  Do not use any tobacco products including cigarettes, chewing tobacco, or electronic cigarettes.  If you are pregnant, do not drink alcohol.  If you are breastfeeding, limit how much and how often you drink alcohol.  Limit alcohol intake to no more than 1 drink per day for nonpregnant women. One drink equals 12 ounces of beer, 5 ounces of wine, or 1 ounces of hard liquor.  Do not use street drugs.  Do not share needles.  Ask your health care provider for help if you need support or information about quitting drugs.  Tell your health care provider if you often feel depressed.  Tell your health care provider if you have ever been abused or do not feel safe at home. This information is not intended to replace advice given to you by your health care provider. Make sure you discuss any questions you have with your health care provider. Document Released: 06/10/2011 Document Revised: 05/02/2016 Document Reviewed: 08/29/2015 Elsevier Interactive Patient Education  [employer].

## 2017-11-25 LAB — CBC
HCT: 42.5 % (ref 35.0–45.0)
Hemoglobin: 14 g/dL (ref 11.7–15.5)
MCH: 28.7 pg (ref 27.0–33.0)
MCHC: 32.9 g/dL (ref 32.0–36.0)
MCV: 87.1 fL (ref 80.0–100.0)
MPV: 9.8 fL (ref 7.5–12.5)
Platelets: 326 10*3/uL (ref 140–400)
RBC: 4.88 10*6/uL (ref 3.80–5.10)
RDW: 13 % (ref 11.0–15.0)
WBC: 9.1 10*3/uL (ref 3.8–10.8)

## 2017-11-25 LAB — HEMOGLOBIN A1C W/OUT EAG: Hgb A1c MFr Bld: 5.5 % of total Hgb (ref <5.7)

## 2017-11-25 LAB — PAP, TP IMAGING W/ HPV RNA, RFLX HPV TYPE 16,18/45: HPV DNA High Risk: NOT DETECTED

## 2017-11-25 LAB — VITAMIN D 1,25 DIHYDROXY
Vitamin D 1, 25 (OH)2 Total: 36 pg/mL (ref 18–72)
Vitamin D2 1, 25 (OH)2: <8 pg/mL
Vitamin D3 1, 25 (OH)2: 36 pg/mL

## 2017-11-25 LAB — COMPREHENSIVE METABOLIC PANEL
AG Ratio: 1.9 (calc) (ref 1.0–2.5)
ALT: 15 U/L (ref 6–29)
AST: 14 U/L (ref 10–35)
Albumin: 4.2 g/dL (ref 3.6–5.1)
Alkaline phosphatase (APISO): 80 U/L (ref 33–115)
BUN: 15 mg/dL (ref 7–25)
CO2: 24 mmol/L (ref 20–32)
Calcium: 8.7 mg/dL (ref 8.6–10.2)
Chloride: 109 mmol/L (ref 98–110)
Creat: 0.75 mg/dL (ref 0.50–1.10)
Globulin: 2.2 g/dL (calc) (ref 1.9–3.7)
Glucose, Bld: 103 mg/dL — ABNORMAL HIGH (ref 65–99)
Potassium: 4 mmol/L (ref 3.5–5.3)
Sodium: 139 mmol/L (ref 135–146)
Total Bilirubin: 0.4 mg/dL (ref 0.2–1.2)
Total Protein: 6.4 g/dL (ref 6.1–8.1)

## 2017-11-25 LAB — TSH: TSH: 0.72 mIU/L

## 2017-11-25 LAB — TEST AUTHORIZATION

## 2017-12-03 ENCOUNTER — Ambulatory Visit (INDEPENDENT_AMBULATORY_CARE_PROVIDER_SITE_OTHER): Payer: 59 | Admitting: Obstetrics & Gynecology

## 2017-12-03 ENCOUNTER — Encounter: Payer: Self-pay | Admitting: Obstetrics & Gynecology

## 2017-12-03 VITALS — BP 130/78

## 2017-12-03 DIAGNOSIS — R87616 Satisfactory cervical smear but lacking transformation zone: Secondary | ICD-10-CM

## 2017-12-03 DIAGNOSIS — Z30433 Encounter for removal and reinsertion of intrauterine contraceptive device: Secondary | ICD-10-CM

## 2017-12-03 DIAGNOSIS — Z124 Encounter for screening for malignant neoplasm of cervix: Secondary | ICD-10-CM | POA: Diagnosis not present

## 2017-12-03 DIAGNOSIS — N72 Inflammatory disease of cervix uteri: Secondary | ICD-10-CM

## 2017-12-03 LAB — WET PREP FOR TRICH, YEAST, CLUE

## 2017-12-03 NOTE — Patient Instructions (Signed)
1. Encounter for removal and reinsertion of intrauterine contraceptive device (IUD) Easy removal of IUD and reinsertion of a new Mirena IUD.  F/U 4 weeks for IUD check.  2. Satisfactory cervical smear but lacking transformation zone Pap reflex done.  HPV HR was negative 11/21/2017.  3. Inflammation of cervix Wet prep negative.  Declines Gono-Chlam testing.  Reassured. - WET PREP FOR [place], YEAST, CLUE  Elira, it was a pleasure seeing you today!  I will inform you of your results as soon as they are available.   Intrauterine Device Insertion, Care After This sheet gives you information about how to care for yourself after your procedure. Your health care provider may also give you more specific instructions. If you have problems or questions, contact your health care provider. What can I expect after the procedure? After the procedure, it is common to have:  Cramps and pain in the abdomen.  Light bleeding (spotting) or heavier bleeding that is like your menstrual period. This may last for up to a few days.  Lower back pain.  Dizziness.  Headaches.  Nausea.  Follow these instructions at home:  Before resuming sexual activity, check to make sure that you can feel the IUD string(s). You should be able to feel the end of the string(s) below the opening of your cervix. If your IUD string is in place, you may resume sexual activity. ? If you had a hormonal IUD inserted more than 7 days after your most recent period started, you will need to use a backup method of birth control for 7 days after IUD insertion. Ask your health care provider whether this applies to you.  Continue to check that the IUD is still in place by feeling for the string(s) after every menstrual period, or once a month.  Take over-the-counter and prescription medicines only as told by your health care provider.  Do not drive or use heavy machinery while taking prescription pain medicine.  Keep all follow-up  visits as told by your health care provider. This is important. Contact a health care provider if:  You have bleeding that is heavier or lasts longer than a normal menstrual cycle.  You have a fever.  You have cramps or abdominal pain that get worse or do not get better with medicine.  You develop abdominal pain that is new or is not in the same area of earlier cramping and pain.  You feel lightheaded or weak.  You have abnormal or bad-smelling discharge from your vagina.  You have pain during sexual activity.  You have any of the following problems with your IUD string(s): ? The string bothers or hurts you or your sexual partner. ? You cannot feel the string. ? The string has gotten longer.  You can feel the IUD in your vagina.  You think you may be pregnant, or you miss your menstrual period.  You think you may have an STI (sexually transmitted infection). Get help right away if:  You have flu-like symptoms.  You have a fever and chills.  You can feel that your IUD has slipped out of place. Summary  After the procedure, it is common to have cramps and pain in the abdomen. It is also common to have light bleeding (spotting) or heavier bleeding that is like your menstrual period.  Continue to check that the IUD is still in place by feeling for the string(s) after every menstrual period, or once a month.  Keep all follow-up visits as told by  your health care provider. This is important.  Contact your health care provider if you have problems with your IUD string(s), such as the string getting longer or bothering you or your sexual partner. This information is not intended to replace advice given to you by your health care provider. Make sure you discuss any questions you have with your health care provider. Document Released: 07/24/2011 Document Revised: 10/16/2016 Document Reviewed: 10/16/2016 Elsevier Interactive Patient Education  2017 [employer].

## 2017-12-03 NOTE — Progress Notes (Signed)
    Patricia Erickson 1971/08/14 [id number]        46 y.o.  G2P1011 Stable boyfriend  RP:  Remove/insert Mirena IUD  HPI: Annual GYN exam November 21, 2017.  Pap test was satisfactory,  negative but no transitional cells present and inflammation was detected.  High risk HPV was negative.  Patient has no vaginal odor or itching right now, but has a history of frequent BVs and yeast infections.  No pelvic pain.  No urinary tract infection symptoms.  Declines STD screening.  Past medical history,surgical history, problem list, medications, allergies, family history and social history were all reviewed and documented in the EPIC chart.  Directed ROS with pertinent positives and negatives documented in the history of present illness/assessment and plan.  Exam:  Vitals:   12/03/17 0811  BP: 130/78   General appearance:  Normal                                                                    IUD procedure note       Patient presented to the office today for removal and placement of Mirena IUD. The patient had previously been provided with literature information on this method of contraception. The risks benefits and pros and cons were discussed and all her questions were answered. She is fully aware that this form of contraception is 99% effective and is good for 5 years.  Pelvic exam: Vulva normal Bartholin urethra Skene glands: Within normal limits Vagina: No lesions or discharge.  Wet prep done Cervix: No lesions or discharge.  Pap reflex done. Uterus: AV position Adnexa: No masses or tenderness Rectal exam: Not done  The cervix was cleansed with Betadine solution. Hurricane spray on cervix.  The single-tooth tenaculum was placed on the anterior cervical lip.  The Mirena IUD strings were grasped with a [place] clamp in the endocervical canal and the IUD was removed easily, complete and intact.  The IUD was shown to the patient and discarded.  The uterus sounded to 8 centimeter. The new  Mirena IUD was shown to the patient and inserted in a sterile fashion. The IUD string was trimmed. The single-tooth tenaculum was removed. Patient was instructed to return back to the office in one month for follow up.       Wet prep neg   Assessment/Plan:  46 y.o. G2P1011   1. Encounter for removal and reinsertion of intrauterine contraceptive device (IUD) Easy removal of IUD and reinsertion of a new Mirena IUD.  F/U 4 weeks for IUD check.  2. Satisfactory cervical smear but lacking transformation zone Pap reflex done.  HPV HR was negative 11/21/2017.  3. Inflammation of cervix Wet prep negative.  Declines Gono-Chlam testing.  Reassured. - WET PREP FOR [place], YEAST, CLUE  Counseling on above issues >50% x 15 minutes.  Princess Bruins MD, 8:32 AM 12/03/2017

## 2017-12-08 ENCOUNTER — Encounter: Payer: Self-pay | Admitting: Anesthesiology

## 2017-12-08 LAB — PAP IG W/ RFLX HPV ASCU

## 2018-01-09 ENCOUNTER — Ambulatory Visit: Payer: 59 | Admitting: Obstetrics & Gynecology

## 2018-01-22 ENCOUNTER — Encounter: Payer: Self-pay | Admitting: Obstetrics & Gynecology

## 2018-01-22 ENCOUNTER — Ambulatory Visit (INDEPENDENT_AMBULATORY_CARE_PROVIDER_SITE_OTHER): Payer: 59 | Admitting: Obstetrics & Gynecology

## 2018-01-22 VITALS — BP 128/86

## 2018-01-22 DIAGNOSIS — Z30431 Encounter for routine checking of intrauterine contraceptive device: Secondary | ICD-10-CM
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
## 2018-01-22 NOTE — Patient Instructions (Signed)
1. IUD check up New Mirena IUD well-tolerated, in good position and with no complication.  Patient reassured.  Will follow up next year for annual gynecologic exam.  Mason, good seeing you today!

## 2018-06-10 DIAGNOSIS — G43719 Chronic migraine without aura, intractable, without status migrainosus: Secondary | ICD-10-CM | POA: Diagnosis not present

## 2018-07-16 ENCOUNTER — Emergency Department (HOSPITAL_BASED_OUTPATIENT_CLINIC_OR_DEPARTMENT_OTHER)
Admission: EM | Admit: 2018-07-16 | Discharge: 2018-07-16 | Disposition: A | Discharge status: Home / Self Care | Payer: 59 | Attending: Emergency Medicine | Admitting: Emergency Medicine

## 2018-07-16 ENCOUNTER — Encounter (HOSPITAL_BASED_OUTPATIENT_CLINIC_OR_DEPARTMENT_OTHER): Payer: Self-pay | Admitting: Emergency Medicine

## 2018-07-16 ENCOUNTER — Other Ambulatory Visit: Payer: Self-pay

## 2018-07-16 DIAGNOSIS — R110 Nausea: Secondary | ICD-10-CM | POA: Diagnosis not present

## 2018-07-16 DIAGNOSIS — R51 Headache: Secondary | ICD-10-CM | POA: Diagnosis present

## 2018-07-16 DIAGNOSIS — G43909 Migraine, unspecified, not intractable, without status migrainosus: Secondary | ICD-10-CM

## 2018-07-16 DIAGNOSIS — Z79899 Other long term (current) drug therapy: Secondary | ICD-10-CM | POA: Insufficient documentation

## 2018-07-16 DIAGNOSIS — G43009 Migraine without aura, not intractable, without status migrainosus: Secondary | ICD-10-CM | POA: Diagnosis not present

## 2018-07-16 DIAGNOSIS — Z87891 Personal history of nicotine dependence: Secondary | ICD-10-CM | POA: Insufficient documentation

## 2018-07-16 DIAGNOSIS — G43719 Chronic migraine without aura, intractable, without status migrainosus: Secondary | ICD-10-CM | POA: Diagnosis not present

## 2018-07-16 MED ORDER — DEXAMETHASONE SODIUM PHOSPHATE 10 MG/ML IJ SOLN
10.0000 mg | Freq: Once | INTRAMUSCULAR | Status: AC
  Administered 2018-07-16: 10 mg via INTRAVENOUS
  Filled 2018-07-16: qty 1

## 2018-07-16 MED ORDER — PROCHLORPERAZINE MALEATE 10 MG PO TABS: 10.0000 mg | ORAL_TABLET | Freq: Two times a day (BID) | ORAL | 0 refills | Status: DC | PRN

## 2018-07-16 MED ORDER — PROCHLORPERAZINE EDISYLATE 10 MG/2ML IJ SOLN
10.0000 mg | Freq: Once | INTRAMUSCULAR | Status: AC
  Administered 2018-07-16: 10 mg via INTRAVENOUS
  Filled 2018-07-16: qty 2

## 2018-07-16 MED ORDER — SODIUM CHLORIDE 0.9 % IV BOLUS
1000.0000 mL | Freq: Once | INTRAVENOUS | Status: AC
  Administered 2018-07-16: 1000 mL via INTRAVENOUS

## 2018-07-16 MED ORDER — DIPHENHYDRAMINE HCL 50 MG/ML IJ SOLN
25.0000 mg | Freq: Once | INTRAMUSCULAR | Status: AC
  Administered 2018-07-16: 25 mg via INTRAVENOUS
  Filled 2018-07-16: qty 1

## 2018-07-16 MED FILL — PROCHLORPERAZINE 10 MG TAB: 10 | 5 days supply | Qty: 10 | Fill #0

## 2018-07-16 NOTE — Discharge Instructions (Signed)
Your history and exam today is consistent with a severe migraine headache.  As her headache improved after medications, we feel you are safe for discharge home.  Please use over-the-counter decongestant medicines and use the nausea/had a medicine we ordered to.  Please follow-up with your PCP.  If any symptoms change or worsen, please return to the nearest emergency department.

## 2018-07-16 NOTE — ED Provider Notes (Signed)
[facility] EMERGENCY DEPARTMENT Provider Note   CSN: [id number] Arrival date & time: 07/16/18  1125     History   Chief Complaint Chief Complaint  Patient presents with  . Headache    HPI Patricia Erickson is a 47 y.o. female.  The history is provided by the patient and medical records. No language interpreter was used.  Migraine  This is a recurrent problem. The current episode started more than 2 days ago. The problem occurs constantly. The problem has not changed since onset.Associated symptoms include headaches. Pertinent negatives include no chest pain, no abdominal pain and no shortness of breath. Nothing (loud noises, bright lights) aggravates the symptoms. Nothing relieves the symptoms. Treatments tried: home meds. The treatment provided no relief.    Past Medical History:  Diagnosis Date  . Anxiety   . Migraines   . Polyp of colon 03/11/2007   BENIGN    Patient Active Problem List   Diagnosis Date Noted  . Vaginal discharge 03/19/2017  . Vaginal burning 03/19/2017  . Flu-like symptoms 01/02/2017  . Genetic testing 12/18/2015  . Gallstones 10/17/2015  . Symptomatic cholelithiasis 10/17/2015  . Healthcare maintenance 07/05/2015  . Intermittent palpitations 07/05/2015  . Family history of colon cancer 10/12/2012  . Family history of breast cancer in female 10/12/2012  . MIGRAINE HEADACHE 01/19/2009  . HEMORRHOIDS 01/10/2009  . COLONIC POLYPS, HX OF 01/10/2009    Past Surgical History:  Procedure Laterality Date  . CESAREAN SECTION  2000  . CHOLECYSTECTOMY N/A 10/18/2015   Procedure: LAPAROSCOPIC CHOLECYSTECTOMY;  Surgeon: Arta Bruce Kinsinger, MD;  Location: [facility] OR;  Service: General;  Laterality: N/A;  . LAPAROSCOPIC CHOLECYSTECTOMY  nov 9 16     OB History    Gravida  2   Para  1   Term  1   Preterm      AB  1   Living  1     SAB  1   TAB      Ectopic      Multiple      Live Births  1            Home Medications     Prior to Admission medications   Medication Sig Start Date End Date Taking? Authorizing Provider  diclofenac (VOLTAREN) 50 MG EC tablet Take 50 mg by mouth 2 (two) times daily.   Yes [provider]  gabapentin (NEURONTIN) 600 MG tablet Take 600 mg by mouth 3 (three) times daily.   Yes [provider]  levonorgestrel (MIRENA) 20 MCG/24HR IUD 1 each by Intrauterine route once. INSERTED 05/06/2007    Yes [provider]  topiramate (TOPAMAX) 200 MG tablet Take 200 mg by mouth at bedtime.   Yes [provider]    Family History Family History  Problem Relation Age of Onset  . Osteoporosis Mother   . Breast cancer Mother 62  . Colon cancer Mother 59       removed large intestine  . Lymphoma Mother 48  . Hypertension Father   . Osteoporosis Maternal Grandmother   . Lymphoma Maternal Grandmother 80  . Colon polyps Sister        "several" dx. 56 or younger  . Other Sister        hysterectomy at 71; suspicious breast finding removed at 84  . Colon cancer Maternal Aunt 23  . Lymphoma Maternal Uncle 63  . Lung cancer Maternal Grandfather 66       metastatic  to bone; former smoker  . Throat cancer Paternal Grandmother 62       not a smoker  . Heart Problems Paternal Grandfather   . Other Other        suspicious breast finding at 69 - recent follow-up normal  . Heart Problems Maternal Uncle   . Bladder Cancer Paternal Uncle 38  . Throat cancer Paternal Uncle        dx. before age 13    Social History Social History   Tobacco Use  . Smoking status: Former Smoker    Packs/day: 0.50    Years: 7.00    Pack years: 3.50    Types: Cigarettes    Last attempt to quit: 10/12/1992    Years since quitting: 25.7  . Smokeless tobacco: Never Used  Substance Use Topics  . Alcohol use: Yes    Alcohol/week: 0.0 standard drinks    Comment: OCCASIONALLY  . Drug use: No     Allergies   Propoxyphene n-acetaminophen   Review of Systems Review of  Systems  Constitutional: Negative for chills, diaphoresis, fatigue and fever.  HENT: Negative for dental problem.   Eyes: Positive for photophobia. Negative for visual disturbance.  Respiratory: Negative for cough, chest tightness, shortness of breath and wheezing.   Cardiovascular: Negative for chest pain, palpitations and leg swelling.  Gastrointestinal: Negative for abdominal pain.  Genitourinary: Negative for dysuria and flank pain.  Musculoskeletal: Negative for back pain, neck pain and neck stiffness.  Skin: Negative for rash and wound.  Neurological: Positive for headaches. Negative for dizziness, seizures, speech difficulty, weakness, light-headedness and numbness.  Hematological: Negative for adenopathy.  Psychiatric/Behavioral: Negative for agitation.  All other systems reviewed and are negative.    Physical Exam Updated Vital Signs BP 135/83 (BP Location: Right Arm)   Pulse 85   Temp 98.3 F (36.8 C) (Oral)   Resp 16   Ht 5' (1.524 m)   Wt 90.7 kg   SpO2 98%   BMI 39.06 kg/m   Physical Exam  Constitutional: She is oriented to person, place, and time. She appears well-developed and well-nourished.  Non-toxic appearance. She does not appear ill. No distress.  HENT:  Head: Normocephalic and atraumatic.  Mouth/Throat: Oropharynx is clear and moist. No oropharyngeal exudate.  Eyes: Pupils are equal, round, and reactive to light. Conjunctivae and EOM are normal.  Neck: Normal range of motion. Neck supple.  Cardiovascular: Normal rate and regular rhythm.  No murmur heard. Pulmonary/Chest: Effort normal and breath sounds normal. No respiratory distress. She has no wheezes. She has no rales. She exhibits no tenderness.  Abdominal: Soft. There is no tenderness.  Musculoskeletal: She exhibits no edema or tenderness.  Neurological: She is alert and oriented to person, place, and time. She displays normal reflexes. No cranial nerve deficit or sensory deficit. She exhibits  normal muscle tone. Coordination normal.  Skin: Skin is warm and dry. Capillary refill takes less than 2 seconds. No rash noted. She is not diaphoretic. No erythema.  Psychiatric: She has a normal mood and affect.  Nursing note and vitals reviewed.    ED Treatments / Results  Labs (all labs ordered are listed, but only abnormal results are displayed) Labs Reviewed - No data to display  EKG None  Radiology No results found.  Procedures Procedures (including critical care time)  Medications Ordered in ED Medications  prochlorperazine (COMPAZINE) injection 10 mg (10 mg Intravenous Given 07/16/18 1314)  diphenhydrAMINE (BENADRYL) injection 25 mg (25 mg Intravenous Given  07/16/18 1314)  sodium chloride 0.9 % bolus 1,000 mL (0 mLs Intravenous Stopped 07/16/18 1354)  dexamethasone (DECADRON) injection 10 mg (10 mg Intravenous Given 07/16/18 1314)     Initial Impression / Assessment and Plan / ED Course  I have reviewed the triage vital signs and the nursing notes.  Pertinent labs & imaging results that were available during my care of the patient were reviewed by me and considered in my medical decision making (see chart for details).     Patricia Erickson is a 47 y.o. female with a past medical history significant for severe migraines presents from her PCP office for headache.  Patient reports that she has had headaches for the last week that is consistent with prior migraines.  She reports photophobia and phonophobia.  She reports the headache is behind her eyes and all across her head going backwards towards her neck.  She denies fevers, chills, or severe neck stiffness.  She reports any new rashes.  She denies any vision changes but does report nausea.  No vomiting.  She denies chest pain, palpitations, shortness of breath, cough, congestion, urinary symptoms or GI symptoms.  She reports her pain is greater than 10 out of 10.  She reports that she has tried over-the-counter medications as well  as home pain medicines for headache.  She also reports that she went to her PCP today who gave her a Toradol shot and after the symptoms did not improve, they told her to come to the ED.  On arrival, patient is still having severe headache.  She has an unremarkable exam including no nuchal rigidity, no numbness, tingling, weakness of extremities.  Pupils are reactive bilaterally.  Normal extra ocular movements.  Clear speech no facial droop.  No evidence of trauma seen.  Lungs clear chest nontender.  Abdomen nontender.  Clinically I suspect she has a severe migraine.  She will be given a headache cocktail and's fluids.  Patient be reassessed.  Have lower suspicion for meningitis, intracranial hemorrhage, or a venous sinus thrombosis given her similar to prior headaches.    Anticipate reassessment after medications.          Patient reports her headache has improved after the medications.  Patient reports she is still having congestion and will use over-the-counter congestion medication.  Next para patient given prescription for Compazine as this helped her today and she will not take her other headache medicine at home.  Patient will follow-up with her PCP and neurologist for further medicine management.  Given improvement in symptoms, low suspicion for other more concerning etiology of symptoms.  Patient will follow-up with her outpatient team and was discharged in good condition with improved symptoms.   Final Clinical Impressions(s) / ED Diagnoses   Final diagnoses:  Migraine without status migrainosus, not intractable, unspecified migraine type  Nausea    ED Discharge Orders         Ordered    prochlorperazine (COMPAZINE) 10 MG tablet  2 times daily PRN     07/16/18 1450         Clinical Impression: 1. Migraine without status migrainosus, not intractable, unspecified migraine type   2. Nausea     Disposition: Discharge  Condition: Good  I have discussed the results, Dx and Tx  plan with the pt(& family if present). He/she/they expressed understanding and agree(s) with the plan. Discharge instructions discussed at great length. Strict return precautions discussed and pt &/or family have verbalized understanding of the instructions.  No further questions at time of discharge.    Discharge Medication List as of 07/16/2018  2:50 PM    START taking these medications   Details  prochlorperazine (COMPAZINE) 10 MG tablet Take 1 tablet (10 mg total) by mouth 2 (two) times daily as needed for nausea or vomiting., Starting Thu 07/16/2018, Print        Follow Up: Leanna Battles, MD [address] [place] [address] [phone]     [place] [address] [id number] DK [facility] [place]       Tegeler, Gwenyth Allegra, MD 07/16/18 [phone]

## 2018-07-16 NOTE — ED Triage Notes (Signed)
Headache x 1 week. Hx of migraines. Tried her normal meds without relief. Saw her doctor this morning and received a Toradol injection without relief.

## 2018-09-25 ENCOUNTER — Other Ambulatory Visit: Payer: Self-pay | Admitting: Obstetrics & Gynecology

## 2018-09-25 ENCOUNTER — Other Ambulatory Visit: Payer: Self-pay | Admitting: Obstetrics and Gynecology

## 2018-09-25 DIAGNOSIS — Z0289 Encounter for other administrative examinations: Secondary | ICD-10-CM

## 2018-09-25 DIAGNOSIS — Z1231 Encounter for screening mammogram for malignant neoplasm of breast: Secondary | ICD-10-CM

## 2018-10-29 ENCOUNTER — Ambulatory Visit
Admission: RE | Admit: 2018-10-29 | Discharge: 2018-10-29 | Disposition: A | Discharge status: Home / Self Care | Payer: 59 | Source: Ambulatory Visit | Attending: Obstetrics & Gynecology | Admitting: Obstetrics & Gynecology

## 2018-10-29 DIAGNOSIS — Z1231 Encounter for screening mammogram for malignant neoplasm of breast: Secondary | ICD-10-CM | POA: Diagnosis not present

## 2018-12-08 DIAGNOSIS — R0602 Shortness of breath: Secondary | ICD-10-CM | POA: Diagnosis not present

## 2018-12-08 DIAGNOSIS — J209 Acute bronchitis, unspecified: Secondary | ICD-10-CM | POA: Diagnosis not present

## 2018-12-08 DIAGNOSIS — J069 Acute upper respiratory infection, unspecified: Secondary | ICD-10-CM | POA: Diagnosis not present

## 2019-01-04 ENCOUNTER — Encounter: Payer: 59 | Admitting: Obstetrics & Gynecology

## 2019-08-25 ENCOUNTER — Emergency Department (HOSPITAL_BASED_OUTPATIENT_CLINIC_OR_DEPARTMENT_OTHER)
Admission: EM | Admit: 2019-08-25 | Discharge: 2019-08-26 | Disposition: A | Discharge status: Home / Self Care | Payer: BC Managed Care – PPO | Attending: Emergency Medicine | Admitting: Emergency Medicine

## 2019-08-25 ENCOUNTER — Encounter (HOSPITAL_BASED_OUTPATIENT_CLINIC_OR_DEPARTMENT_OTHER): Payer: Self-pay

## 2019-08-25 ENCOUNTER — Emergency Department (HOSPITAL_BASED_OUTPATIENT_CLINIC_OR_DEPARTMENT_OTHER): Payer: BC Managed Care – PPO

## 2019-08-25 ENCOUNTER — Other Ambulatory Visit: Payer: Self-pay

## 2019-08-25 DIAGNOSIS — Z87891 Personal history of nicotine dependence: Secondary | ICD-10-CM | POA: Diagnosis not present

## 2019-08-25 DIAGNOSIS — Y92410 Unspecified street and highway as the place of occurrence of the external cause: Secondary | ICD-10-CM | POA: Insufficient documentation

## 2019-08-25 DIAGNOSIS — K118 Other diseases of salivary glands: Secondary | ICD-10-CM | POA: Insufficient documentation

## 2019-08-25 DIAGNOSIS — R51 Headache: Secondary | ICD-10-CM | POA: Diagnosis not present

## 2019-08-25 DIAGNOSIS — S199XXA Unspecified injury of neck, initial encounter: Secondary | ICD-10-CM | POA: Diagnosis present

## 2019-08-25 DIAGNOSIS — S161XXA Strain of muscle, fascia and tendon at neck level, initial encounter: Secondary | ICD-10-CM | POA: Insufficient documentation

## 2019-08-25 DIAGNOSIS — Z79899 Other long term (current) drug therapy: Secondary | ICD-10-CM | POA: Insufficient documentation

## 2019-08-25 DIAGNOSIS — Y998 Other external cause status: Secondary | ICD-10-CM | POA: Diagnosis not present

## 2019-08-25 DIAGNOSIS — Y9389 Activity, other specified: Secondary | ICD-10-CM | POA: Insufficient documentation

## 2019-08-25 MED ORDER — CHLORPROMAZINE HCL 25 MG PO TABS
25.0000 mg | ORAL_TABLET | Freq: Once | ORAL | Status: AC
  Administered 2019-08-26: 25 mg via ORAL
  Filled 2019-08-25: qty 1
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
## 2019-08-26 MED ORDER — NAPROXEN 250 MG PO TABS
500.0000 mg | ORAL_TABLET | Freq: Once | ORAL | Status: AC
  Administered 2019-08-26: 500 mg via ORAL
  Filled 2019-08-26: qty 2

## 2019-11-15 ENCOUNTER — Other Ambulatory Visit: Payer: Self-pay

## 2019-11-15 ENCOUNTER — Other Ambulatory Visit: Payer: Self-pay | Admitting: Obstetrics and Gynecology

## 2019-11-15 ENCOUNTER — Ambulatory Visit
Admission: RE | Admit: 2019-11-15 | Discharge: 2019-11-15 | Disposition: A | Discharge status: Home / Self Care | Payer: BC Managed Care – PPO | Source: Ambulatory Visit | Attending: Obstetrics and Gynecology | Admitting: Obstetrics and Gynecology

## 2019-11-15 DIAGNOSIS — Z1231 Encounter for screening mammogram for malignant neoplasm of breast: Secondary | ICD-10-CM

## 2019-12-06 ENCOUNTER — Emergency Department (HOSPITAL_COMMUNITY)
Admission: EM | Admit: 2019-12-06 | Discharge: 2019-12-06 | Disposition: A | Discharge status: Home / Self Care | Payer: BC Managed Care – PPO | Attending: Emergency Medicine | Admitting: Emergency Medicine

## 2019-12-06 ENCOUNTER — Other Ambulatory Visit: Payer: Self-pay

## 2019-12-06 ENCOUNTER — Encounter (HOSPITAL_COMMUNITY): Payer: Self-pay | Admitting: Emergency Medicine

## 2019-12-06 DIAGNOSIS — Z87891 Personal history of nicotine dependence: Secondary | ICD-10-CM | POA: Insufficient documentation

## 2019-12-06 DIAGNOSIS — R531 Weakness: Secondary | ICD-10-CM

## 2019-12-06 DIAGNOSIS — M6281 Muscle weakness (generalized): Secondary | ICD-10-CM | POA: Insufficient documentation

## 2019-12-06 DIAGNOSIS — R197 Diarrhea, unspecified: Secondary | ICD-10-CM | POA: Insufficient documentation

## 2019-12-06 DIAGNOSIS — R509 Fever, unspecified: Secondary | ICD-10-CM | POA: Diagnosis present

## 2019-12-06 DIAGNOSIS — B349 Viral infection, unspecified: Secondary | ICD-10-CM | POA: Insufficient documentation

## 2019-12-06 DIAGNOSIS — Z20828 Contact with and (suspected) exposure to other viral communicable diseases: Secondary | ICD-10-CM | POA: Insufficient documentation

## 2019-12-06 LAB — URINALYSIS, ROUTINE W REFLEX MICROSCOPIC
Bilirubin Urine: NEGATIVE
Glucose, UA: NEGATIVE mg/dL
Hgb urine dipstick: NEGATIVE
Ketones, ur: NEGATIVE mg/dL
Leukocytes,Ua: NEGATIVE
Nitrite: NEGATIVE
Protein, ur: NEGATIVE mg/dL
Specific Gravity, Urine: 1.013 (ref 1.005–1.030)
pH: 6 (ref 5.0–8.0)

## 2019-12-06 LAB — COMPREHENSIVE METABOLIC PANEL
ALT: 30 U/L (ref 0–44)
AST: 21 U/L (ref 15–41)
Albumin: 3.9 g/dL (ref 3.5–5.0)
Alkaline Phosphatase: 80 U/L (ref 38–126)
Anion gap: 9 (ref 5–15)
BUN: 19 mg/dL (ref 6–20)
CO2: 21 mmol/L — ABNORMAL LOW (ref 22–32)
Calcium: 8.3 mg/dL — ABNORMAL LOW (ref 8.9–10.3)
Chloride: 110 mmol/L (ref 98–111)
Creatinine, Ser: 0.84 mg/dL (ref 0.44–1.00)
GFR calc Af Amer: >60 mL/min (ref >60)
GFR calc non Af Amer: >60 mL/min (ref >60)
Glucose, Bld: 90 mg/dL (ref 70–99)
Potassium: 3.5 mmol/L (ref 3.5–5.1)
Sodium: 140 mmol/L (ref 135–145)
Total Bilirubin: 0.1 mg/dL — ABNORMAL LOW (ref 0.3–1.2)
Total Protein: 6.7 g/dL (ref 6.5–8.1)

## 2019-12-06 LAB — CBC WITH DIFFERENTIAL/PLATELET
Abs Immature Granulocytes: 0.01 10*3/uL (ref 0.00–0.07)
Basophils Absolute: 0 10*3/uL (ref 0.0–0.1)
Basophils Relative: 0 %
Eosinophils Absolute: 0 10*3/uL (ref 0.0–0.5)
Eosinophils Relative: 0 %
HCT: 42.6 % (ref 36.0–46.0)
Hemoglobin: 13.5 g/dL (ref 12.0–15.0)
Immature Granulocytes: 0 %
Lymphocytes Relative: 30 %
Lymphs Abs: 1.4 10*3/uL (ref 0.7–4.0)
MCH: 29.6 pg (ref 26.0–34.0)
MCHC: 31.7 g/dL (ref 30.0–36.0)
MCV: 93.4 fL (ref 80.0–100.0)
Monocytes Absolute: 0.6 10*3/uL (ref 0.1–1.0)
Monocytes Relative: 12 %
Neutro Abs: 2.7 10*3/uL (ref 1.7–7.7)
Neutrophils Relative %: 58 %
Platelets: 202 10*3/uL (ref 150–400)
RBC: 4.56 MIL/uL (ref 3.87–5.11)
RDW: 14.1 % (ref 11.5–15.5)
WBC: 4.7 10*3/uL (ref 4.0–10.5)
nRBC: 0 % (ref 0.0–0.2)

## 2019-12-06 MED ORDER — SODIUM CHLORIDE 0.9 % IV BOLUS
1000.0000 mL | Freq: Once | INTRAVENOUS | Status: AC
  Administered 2019-12-06: 1000 mL via INTRAVENOUS

## 2019-12-06 NOTE — Discharge Instructions (Addendum)
Push hydrating fluids such as Gatorade.  Recommend bland diet such as bananas, rice, applesauce, toast.  Advance slowly as symptoms improve. Follow-up with your primary care provider for recheck this week, return to ER for new or worsening symptoms. Home to quarantine until you know your Covid test results.

## 2019-12-06 NOTE — ED Provider Notes (Signed)
[facility] DEPT Provider Note   CSN: [id number] Arrival date & time: 12/06/19  1441     History Chief Complaint  Patient presents with  . Weakness    Patricia Erickson is a 48 y.o. female.  48yo female presents with complaint of feeling unwell, while waiting to be seen, passed out (yesterday). Feeling worse today and decided to come to the ER. Fever (normal 97.3, has been 99.9), chills, aches, nausea, diarrhea, generalized weakness, mild sore throat, onset 12/04/2019. Today developed a headache- had syncopal episode yesterday (felt clammy prior to event, felt presyncopal), unsure if anyone assisted her to the ground, no noticeable injuries to her head. Tender neck lymph nodes. Negative COVID test at [facility] yesterday. No loss of smell or taste. Works at [employer], unknown sick contacts. No recent travel. Denies abdominal pain, profuse runny nose, cough, SHOB. Has not taken anything for symptoms today.  No history of asthma or chronic lung disease, non smoker.  Patricia Erickson was evaluated in Emergency Department on 12/06/2019 for the symptoms described in the history of present illness. She was evaluated in the context of the global COVID-19 pandemic, which necessitated consideration that the patient might be at risk for infection with the SARS-CoV-2 virus that causes COVID-19. Institutional protocols and algorithms that pertain to the evaluation of patients at risk for COVID-19 are in a state of rapid change based on information released by regulatory bodies including the CDC and federal and state organizations. These policies and algorithms were followed during the patient's care in the ED.         Past Medical History:  Diagnosis Date  . Anxiety   . Migraines   . Polyp of colon 03/11/2007   BENIGN    Patient Active Problem List   Diagnosis Date Noted  . Vaginal discharge 03/19/2017  . Vaginal burning 03/19/2017  . Flu-like symptoms 01/02/2017  .  Genetic testing 12/18/2015  . Gallstones 10/17/2015  . Symptomatic cholelithiasis 10/17/2015  . Healthcare maintenance 07/05/2015  . Intermittent palpitations 07/05/2015  . Family history of colon cancer 10/12/2012  . Family history of breast cancer in female 10/12/2012  . MIGRAINE HEADACHE 01/19/2009  . HEMORRHOIDS 01/10/2009  . COLONIC POLYPS, HX OF 01/10/2009    Past Surgical History:  Procedure Laterality Date  . CESAREAN SECTION  2000  . CHOLECYSTECTOMY N/A 10/18/2015   Procedure: LAPAROSCOPIC CHOLECYSTECTOMY;  Surgeon: Arta Bruce Kinsinger, MD;  Location: [facility] OR;  Service: General;  Laterality: N/A;  . LAPAROSCOPIC CHOLECYSTECTOMY  nov 9 16     OB History    Gravida  2   Para  1   Term  1   Preterm      AB  1   Living  1     SAB  1   TAB      Ectopic      Multiple      Live Births  1           Family History  Problem Relation Age of Onset  . Osteoporosis Mother   . Breast cancer Mother 53  . Colon cancer Mother 68       removed large intestine  . Lymphoma Mother 38  . Hypertension Father   . Osteoporosis Maternal Grandmother   . Lymphoma Maternal Grandmother 80  . Colon polyps Sister        "several" dx. 17 or younger  . Other Sister        hysterectomy at  35; suspicious breast finding removed at 36  . Colon cancer Maternal Aunt 55  . Lymphoma Maternal Uncle 63  . Lung cancer Maternal Grandfather 66       metastatic to bone; former smoker  . Throat cancer Paternal Grandmother 71       not a smoker  . Heart Problems Paternal Grandfather   . Other Other        suspicious breast finding at 56 - recent follow-up normal  . Heart Problems Maternal Uncle   . Bladder Cancer Paternal Uncle 70  . Throat cancer Paternal Uncle        dx. before age 40    Social History   Tobacco Use  . Smoking status: Former Smoker    Packs/day: 0.50    Years: 7.00    Pack years: 3.50    Types: Cigarettes    Quit date: 10/12/1992    Years since quitting:  27.1  . Smokeless tobacco: Never Used  Substance Use Topics  . Alcohol use: Not Currently    Alcohol/week: 0.0 standard drinks  . Drug use: No    Home Medications Prior to Admission medications   Medication Sig Start Date End Date Taking? Authorizing Provider  baclofen (LIORESAL) 10 MG tablet Take 10 mg by mouth 2 (two) times daily as needed (headache).  07/21/19  Yes [provider]  chlorproMAZINE (THORAZINE) 25 MG tablet Take 25 mg by mouth 4 (four) times daily as needed (headache).  07/11/19  Yes [provider]  levonorgestrel (MIRENA) 20 MCG/24HR IUD 1 each by Intrauterine route once. INSERTED 05/06/2007    Yes [provider]  Multiple Vitamins-Minerals (MULTIVITAMIN ADULTS) TABS Take 1 tablet by mouth daily.   Yes [provider]  topiramate (TOPAMAX) 200 MG tablet Take 200 mg by mouth at bedtime.   Yes [provider]  prochlorperazine (COMPAZINE) 10 MG tablet Take 1 tablet (10 mg total) by mouth 2 (two) times daily as needed for nausea or vomiting. 07/16/18 08/26/19  Tegeler, Gwenyth Allegra, MD    Allergies    Propoxyphene n-acetaminophen  Review of Systems   Review of Systems  Constitutional: Positive for chills. Negative for fever.  HENT: Positive for sore throat. Negative for congestion and rhinorrhea.   Respiratory: Negative for cough and shortness of breath.   Cardiovascular: Negative for chest pain.  Gastrointestinal: Positive for diarrhea and nausea. Negative for abdominal pain, constipation and vomiting.  Genitourinary: Negative for decreased urine volume and dysuria.  Musculoskeletal: Positive for arthralgias and myalgias.  Skin: Negative for rash and wound.  Allergic/Immunologic: Negative for immunocompromised state.  Neurological: Positive for weakness and headaches.  Hematological: Positive for adenopathy.  Psychiatric/Behavioral: Negative for confusion.  All other systems reviewed and are negative.   Physical  Exam Updated Vital Signs BP 133/86   Pulse 100   Temp 99.5 F (37.5 C) (Oral)   Resp 17   Ht 5\' 6"  (1.676 m)   Wt 85.3 kg   SpO2 100%   BMI 30.34 kg/m   Physical Exam Vitals and nursing note reviewed.  Constitutional:      General: She is not in acute distress.    Appearance: She is well-developed. She is not diaphoretic.  HENT:     Head: Normocephalic and atraumatic.  Cardiovascular:     Rate and Rhythm: Normal rate and regular rhythm.     Pulses: Normal pulses.     Heart sounds: Normal heart sounds.  Pulmonary:     Effort: Pulmonary  effort is normal.     Breath sounds: Normal breath sounds.  Abdominal:     Palpations: Abdomen is soft.     Tenderness: There is no abdominal tenderness.  Musculoskeletal:     Right lower leg: No edema.     Left lower leg: No edema.  Skin:    General: Skin is warm and dry.  Neurological:     General: No focal deficit present.     Mental Status: She is alert and oriented to person, place, and time.  Psychiatric:        Behavior: Behavior normal.     ED Results / Procedures / Treatments   Labs (all labs ordered are listed, but only abnormal results are displayed) Labs Reviewed  COMPREHENSIVE METABOLIC PANEL - Abnormal; Notable for the following components:      Result Value   CO2 21 (*)    Calcium 8.3 (*)    Total Bilirubin 0.1 (*)    All other components within normal limits  URINALYSIS, ROUTINE W REFLEX MICROSCOPIC - Abnormal; Notable for the following components:   Color, Urine STRAW (*)    All other components within normal limits  GI PATHOGEN PANEL BY PCR, STOOL  C DIFFICILE QUICK SCREEN W PCR REFLEX  NOVEL CORONAVIRUS, NAA (HOSP ORDER, SEND-OUT TO REF LAB; TAT 18-24 HRS)  CBC WITH DIFFERENTIAL/PLATELET    EKG EKG Interpretation  Date/Time:  Monday December 06 2019 19:47:12 EST Ventricular Rate:  82 PR Interval:    QRS Duration: 81 QT Interval:  351 QTC Calculation: 410 R Axis:   28 Text Interpretation: Sinus  rhythm Low voltage, precordial leads RSR' in V1 or V2, probably normal variant Baseline wander in lead(s) V2 since last tracing no significant change Confirmed by Daleen Bo [phone]) on 12/06/2019 8:56:34 PM   Radiology No results found.  Procedures Procedures (including critical care time)  Medications Ordered in ED Medications  sodium chloride 0.9 % bolus 1,000 mL (0 mLs Intravenous Stopped 12/06/19 1746)    ED Course  I have reviewed the triage vital signs and the nursing notes.  Pertinent labs & imaging results that were available during my care of the patient were reviewed by me and considered in my medical decision making (see chart for details).  Clinical Course as of Dec 05 2321  Mon Dec 28, 335  3445 48 year old female presents with complaint of generalized body weakness, nausea, diarrhea, body aches and chills.  Patient was concerned that she may have Covid and went to urgent care to get tested yesterday.  Patient had presyncopal symptoms followed by syncopal episode, did have her Covid test and it was negative however was told this was a rapid test not always very accurate.  Patient reports ongoing symptoms with persistent loose stools which prompted her to come to the ER today.  On exam patient is well-appearing, abdomen soft and nontender, lungs are clear heart regular rate and rhythm.  Patient was given IV fluids, not found to be orthostatic, urinalysis is unremarkable, CBC and CMP without significant findings.  Requested GI bio fire panel however patient is not been able to produce a stool sample after multiple hours and is ready for discharge home.  EKG without acute ischemic changes or arrhythmia.  Plan is for patient to follow-up with her PCP, send out Covid test and quarantine awaiting results.   [LM]    Clinical Course User Index [LM] Roque Lias   MDM Rules/Calculators/A&P  Final Clinical Impression(s) / ED Diagnoses Final  diagnoses:  Generalized weakness  Diarrhea, unspecified type  Viral illness    Rx / DC Orders ED Discharge Orders    None       Roque Lias 12/06/19 2322    Daleen Bo, MD 12/07/19 2259

## 2019-12-06 NOTE — ED Triage Notes (Addendum)
Pt chills/fever, weakness, fatigue, headache, nausea, and diarrhea onset 12/26. Pt was at urgent care yesterday and had - rapid covid; passed while being tested but refused transport to ER.   Awoke this am with "left side of my neck swollen; like my gland is swollen." Pt speaking full sentences; denies cough or SOB.

## 2019-12-07 LAB — NOVEL CORONAVIRUS, NAA (HOSP ORDER, SEND-OUT TO REF LAB; TAT 18-24 HRS): SARS-CoV-2, NAA: NOT DETECTED

## 2020-02-16 ENCOUNTER — Other Ambulatory Visit: Payer: Self-pay | Admitting: Otolaryngology

## 2020-02-16 DIAGNOSIS — K118 Other diseases of salivary glands: Secondary | ICD-10-CM

## 2020-02-28 ENCOUNTER — Ambulatory Visit
Admission: RE | Admit: 2020-02-28 | Discharge: 2020-02-28 | Disposition: A | Discharge status: Home / Self Care | Payer: BC Managed Care – PPO | Source: Ambulatory Visit | Attending: Otolaryngology | Admitting: Otolaryngology

## 2020-02-28 ENCOUNTER — Other Ambulatory Visit: Payer: Self-pay

## 2020-02-28 DIAGNOSIS — K118 Other diseases of salivary glands: Secondary | ICD-10-CM

## 2020-02-28 MED ORDER — IOPAMIDOL (ISOVUE-300) INJECTION 61%
75.0000 mL | Freq: Once | INTRAVENOUS | Status: AC | PRN
  Administered 2020-02-28: 75 mL via INTRAVENOUS

## 2020-07-24 ENCOUNTER — Other Ambulatory Visit: Payer: Self-pay | Admitting: Urology

## 2020-07-25 NOTE — Progress Notes (Addendum)
...DUE TO COVID-19 ONLY ONE VISITOR IS ALLOWED TO COME WITH YOU AND STAY IN THE WAITING ROOM ONLY DURING PRE OP AND PROCEDURE DAY OF SURGERY. THE 1 VISITOR  MAY VISIT WITH YOU AFTER SURGERY IN YOUR PRIVATE ROOM DURING VISITING HOURS ONLY!  YOU NEED TO HAVE A COVID 19 TEST ON_______ @_______ , THIS TEST MUST BE DONE BEFORE SURGERY,  COVID TESTING SITE [address] [place] [address], IT IS ON THE RIGHT GOING OUT [address] APPROXIMATELY  2 MINUTES PAST [employer] ON THE RIGHT. ONCE YOUR COVID TEST IS COMPLETED,  PLEASE BEGIN THE QUARANTINE INSTRUCTIONS AS OUTLINED IN YOUR HANDOUT.                Destry Bezdek  07/25/2020   Your procedure is scheduled on:       07/31/2020   Report to [facility] Main  Entrance   Report to admitting at   0715am     Call this number if you have problems the morning of surgery [phone]    Remember: Do not eat food or drink liquids :After Midnight. BRUSH YOUR TEETH MORNING OF SURGERY AND RINSE YOUR MOUTH OUT, NO CHEWING GUM CANDY OR MINTS.     Take these medicines the morning of surgery with A SIP OF WATER:                    None              You may not have any metal on your body including hair pins and              piercings  Do not wear jewelry, make-up, lotions, powders or perfumes, deodorant             Do not wear nail polish on your fingernails.  Do not shave  48 hours prior to surgery.              Men may shave face and neck.   Do not bring valuables to the hospital. [place].  Contacts, dentures or bridgework may not be worn into surgery.  Leave suitcase in the car. After surgery it may be brought to your room.     Patients discharged the day of surgery will not be allowed to drive home. IF YOU ARE HAVING SURGERY AND GOING HOME THE SAME DAY, YOU MUST HAVE AN ADULT TO DRIVE YOU HOME AND BE WITH YOU FOR 24 HOURS. YOU MAY GO HOME BY TAXI OR UBER OR  ORTHERWISE, BUT AN ADULT MUST ACCOMPANY YOU HOME AND STAY WITH YOU FOR 24 HOURS.  Name and phone number of your driver:  Special Instructions: N/A              Please read over the following fact sheets you were given: _____________________________________________________________________             [facility] - Preparing for Surgery Before surgery, you can play an important role.  Because skin is not sterile, your skin needs to be as free of germs as possible.  You can reduce the number of germs on your skin by washing with CHG (chlorahexidine gluconate) soap before surgery.  CHG is an antiseptic cleaner which kills germs and bonds with the skin to continue killing germs even after washing. Please DO NOT use if you have an allergy  to CHG or antibacterial soaps.  If your skin becomes reddened/irritated stop using the CHG and inform your nurse when you arrive at Short Stay. Do not shave (including legs and underarms) for at least 48 hours prior to the first CHG shower.  You may shave your face/neck. Please follow these instructions carefully:  1.  Shower with CHG Soap the night before surgery and the  morning of Surgery.  2.  If you choose to wash your hair, wash your hair first as usual with your  normal  shampoo.  3.  After you shampoo, rinse your hair and body thoroughly to remove the  shampoo.                           4.  Use CHG as you would any other liquid soap.  You can apply chg directly  to the skin and wash                       Gently with a scrungie or clean washcloth.  5.  Apply the CHG Soap to your body ONLY FROM THE NECK DOWN.   Do not use on face/ open                           Wound or open sores. Avoid contact with eyes, ears mouth and genitals (private parts).                       Wash face,  Genitals (private parts) with your normal soap.             6.  Wash thoroughly, paying special attention to the area where your surgery  will be performed.  7.  Thoroughly rinse  your body with warm water from the neck down.  8.  DO NOT shower/wash with your normal soap after using and rinsing off  the CHG Soap.                9.  Pat yourself dry with a clean towel.            10.  Wear clean pajamas.            11.  Place clean sheets on your bed the night of your first shower and do not  sleep with pets. Day of Surgery : Do not apply any lotions/deodorants the morning of surgery.  Please wear clean clothes to the hospital/surgery center.  FAILURE TO FOLLOW THESE INSTRUCTIONS MAY RESULT IN THE CANCELLATION OF YOUR SURGERY PATIENT SIGNATURE_________________________________  NURSE SIGNATURE__________________________________  ________________________________________________________________________

## 2020-07-26 ENCOUNTER — Encounter (HOSPITAL_COMMUNITY): Payer: Self-pay

## 2020-07-26 ENCOUNTER — Other Ambulatory Visit: Payer: Self-pay

## 2020-07-26 ENCOUNTER — Encounter (HOSPITAL_COMMUNITY)
Admission: RE | Admit: 2020-07-26 | Discharge: 2020-07-26 | Disposition: A | Discharge status: Home / Self Care | Payer: BC Managed Care – PPO | Source: Ambulatory Visit | Attending: Urology | Admitting: Urology

## 2020-07-26 DIAGNOSIS — Z01812 Encounter for preprocedural laboratory examination: Secondary | ICD-10-CM | POA: Insufficient documentation

## 2020-07-26 HISTORY — DX: Personal history of urinary calculi: Z87.442

## 2020-07-26 LAB — CBC
HCT: 43.1 % (ref 36.0–46.0)
Hemoglobin: 14 g/dL (ref 12.0–15.0)
MCH: 29.4 pg (ref 26.0–34.0)
MCHC: 32.5 g/dL (ref 30.0–36.0)
MCV: 90.5 fL (ref 80.0–100.0)
Platelets: 278 10*3/uL (ref 150–400)
RBC: 4.76 MIL/uL (ref 3.87–5.11)
RDW: 13.9 % (ref 11.5–15.5)
WBC: 6.4 10*3/uL (ref 4.0–10.5)
nRBC: 0 % (ref 0.0–0.2)

## 2020-07-26 LAB — BASIC METABOLIC PANEL
Anion gap: 7 (ref 5–15)
BUN: 17 mg/dL (ref 6–20)
CO2: 21 mmol/L — ABNORMAL LOW (ref 22–32)
Calcium: 9 mg/dL (ref 8.9–10.3)
Chloride: 113 mmol/L — ABNORMAL HIGH (ref 98–111)
Creatinine, Ser: 0.78 mg/dL (ref 0.44–1.00)
GFR calc Af Amer: >60 mL/min (ref >60)
GFR calc non Af Amer: >60 mL/min (ref >60)
Glucose, Bld: 99 mg/dL (ref 70–99)
Potassium: 3.9 mmol/L (ref 3.5–5.1)
Sodium: 141 mmol/L (ref 135–145)

## 2020-07-26 NOTE — Progress Notes (Signed)
EKG-12/07/19-epic.

## 2020-07-27 ENCOUNTER — Other Ambulatory Visit (HOSPITAL_COMMUNITY)
Admission: RE | Admit: 2020-07-27 | Discharge: 2020-07-27 | Disposition: A | Discharge status: Home / Self Care | Payer: BC Managed Care – PPO | Source: Ambulatory Visit | Attending: Urology | Admitting: Urology

## 2020-07-27 DIAGNOSIS — Z20822 Contact with and (suspected) exposure to covid-19: Secondary | ICD-10-CM | POA: Diagnosis not present

## 2020-07-27 DIAGNOSIS — Z01812 Encounter for preprocedural laboratory examination: Secondary | ICD-10-CM | POA: Insufficient documentation

## 2020-07-27 LAB — SARS CORONAVIRUS 2 (TAT 6-24 HRS): SARS Coronavirus 2: NEGATIVE

## 2020-07-31 ENCOUNTER — Ambulatory Visit (HOSPITAL_COMMUNITY): Payer: BC Managed Care – PPO

## 2020-07-31 ENCOUNTER — Other Ambulatory Visit: Payer: Self-pay

## 2020-07-31 ENCOUNTER — Encounter (HOSPITAL_COMMUNITY): Payer: Self-pay | Admitting: Urology

## 2020-07-31 ENCOUNTER — Ambulatory Visit (HOSPITAL_COMMUNITY): Payer: BC Managed Care – PPO | Admitting: Anesthesiology

## 2020-07-31 ENCOUNTER — Encounter (HOSPITAL_COMMUNITY)
Admission: RE | Disposition: A | Discharge status: Home / Self Care | Payer: Self-pay | Source: Home / Self Care | Attending: Urology

## 2020-07-31 ENCOUNTER — Ambulatory Visit (HOSPITAL_COMMUNITY)
Admission: RE | Admit: 2020-07-31 | Discharge: 2020-07-31 | Disposition: A | Discharge status: Home / Self Care | Payer: BC Managed Care – PPO | Attending: Urology | Admitting: Urology

## 2020-07-31 DIAGNOSIS — G43909 Migraine, unspecified, not intractable, without status migrainosus: Secondary | ICD-10-CM | POA: Diagnosis not present

## 2020-07-31 DIAGNOSIS — F419 Anxiety disorder, unspecified: Secondary | ICD-10-CM | POA: Diagnosis not present

## 2020-07-31 DIAGNOSIS — K5730 Diverticulosis of large intestine without perforation or abscess without bleeding: Secondary | ICD-10-CM | POA: Diagnosis not present

## 2020-07-31 DIAGNOSIS — Z9049 Acquired absence of other specified parts of digestive tract: Secondary | ICD-10-CM | POA: Insufficient documentation

## 2020-07-31 DIAGNOSIS — N132 Hydronephrosis with renal and ureteral calculous obstruction: Secondary | ICD-10-CM | POA: Diagnosis not present

## 2020-07-31 DIAGNOSIS — Z79899 Other long term (current) drug therapy: Secondary | ICD-10-CM | POA: Insufficient documentation

## 2020-07-31 DIAGNOSIS — Z87891 Personal history of nicotine dependence: Secondary | ICD-10-CM | POA: Insufficient documentation

## 2020-07-31 DIAGNOSIS — Z885 Allergy status to narcotic agent status: Secondary | ICD-10-CM | POA: Diagnosis not present

## 2020-07-31 DIAGNOSIS — Z87442 Personal history of urinary calculi: Secondary | ICD-10-CM | POA: Diagnosis not present

## 2020-07-31 DIAGNOSIS — N202 Calculus of kidney with calculus of ureter: Secondary | ICD-10-CM | POA: Diagnosis present

## 2020-07-31 HISTORY — PX: CYSTOSCOPY/URETEROSCOPY/HOLMIUM LASER/STENT PLACEMENT: SHX6546

## 2020-07-31 SURGERY — CYSTOSCOPY/URETEROSCOPY/HOLMIUM LASER/STENT PLACEMENT
Anesthesia: General | Laterality: Left

## 2020-07-31 MED ORDER — LACTATED RINGERS IV SOLN: INTRAVENOUS | Status: DC

## 2020-07-31 MED ORDER — OXYBUTYNIN CHLORIDE 5 MG PO TABS: 5.0000 mg | ORAL_TABLET | Freq: Three times a day (TID) | ORAL | 0 refills | Status: DC | PRN

## 2020-07-31 MED ORDER — PHENYLEPHRINE 40 MCG/ML (10ML) SYRINGE FOR IV PUSH (FOR BLOOD PRESSURE SUPPORT)
PREFILLED_SYRINGE | INTRAVENOUS | Status: DC | PRN
  Administered 2020-07-31: 40 ug via INTRAVENOUS

## 2020-07-31 MED ORDER — AMISULPRIDE (ANTIEMETIC) 5 MG/2ML IV SOLN: 10.0000 mg | Freq: Once | INTRAVENOUS | Status: DC | PRN

## 2020-07-31 MED ORDER — CEFAZOLIN SODIUM-DEXTROSE 2-4 GM/100ML-% IV SOLN
INTRAVENOUS | Status: AC
  Filled 2020-07-31: qty 100

## 2020-07-31 MED ORDER — MIDAZOLAM HCL 2 MG/2ML IJ SOLN
INTRAMUSCULAR | Status: AC
  Filled 2020-07-31: qty 2

## 2020-07-31 MED ORDER — FENTANYL CITRATE (PF) 100 MCG/2ML IJ SOLN
INTRAMUSCULAR | Status: DC | PRN
  Administered 2020-07-31 (×4): 25 ug via INTRAVENOUS

## 2020-07-31 MED ORDER — DEXAMETHASONE SODIUM PHOSPHATE 10 MG/ML IJ SOLN
INTRAMUSCULAR | Status: AC
  Filled 2020-07-31: qty 1

## 2020-07-31 MED ORDER — DEXAMETHASONE SODIUM PHOSPHATE 10 MG/ML IJ SOLN
INTRAMUSCULAR | Status: DC | PRN
  Administered 2020-07-31: 8 mg via INTRAVENOUS

## 2020-07-31 MED ORDER — HYDROMORPHONE HCL 1 MG/ML IJ SOLN
0.2500 mg | INTRAMUSCULAR | Status: DC | PRN
  Administered 2020-07-31 (×2): 0.5 mg via INTRAVENOUS

## 2020-07-31 MED ORDER — PROPOFOL 10 MG/ML IV BOLUS
INTRAVENOUS | Status: DC | PRN
  Administered 2020-07-31: 160 mg via INTRAVENOUS
  Administered 2020-07-31: 40 mg via INTRAVENOUS

## 2020-07-31 MED ORDER — PROMETHAZINE HCL 25 MG/ML IJ SOLN: 6.2500 mg | INTRAMUSCULAR | Status: DC | PRN

## 2020-07-31 MED ORDER — PHENYLEPHRINE 40 MCG/ML (10ML) SYRINGE FOR IV PUSH (FOR BLOOD PRESSURE SUPPORT)
PREFILLED_SYRINGE | INTRAVENOUS | Status: AC
  Filled 2020-07-31: qty 10

## 2020-07-31 MED ORDER — OXYCODONE HCL 5 MG PO TABS: 5.0000 mg | ORAL_TABLET | Freq: Once | ORAL | Status: AC | PRN

## 2020-07-31 MED ORDER — TAMSULOSIN HCL 0.4 MG PO CAPS: 0.4000 mg | ORAL_CAPSULE | Freq: Every day | ORAL | 0 refills | Status: DC

## 2020-07-31 MED ORDER — SODIUM CHLORIDE 0.9 % IR SOLN
Status: DC | PRN
  Administered 2020-07-31: 6000 mL via INTRAVESICAL

## 2020-07-31 MED ORDER — CEFAZOLIN SODIUM-DEXTROSE 2-4 GM/100ML-% IV SOLN
2.0000 g | INTRAVENOUS | Status: AC
  Administered 2020-07-31: 2 g via INTRAVENOUS

## 2020-07-31 MED ORDER — OXYCODONE HCL 5 MG/5ML PO SOLN: 5.0000 mg | Freq: Once | ORAL | Status: AC | PRN

## 2020-07-31 MED ORDER — OXYCODONE HCL 5 MG PO TABS
ORAL_TABLET | ORAL | Status: AC
  Administered 2020-07-31: 5 mg via ORAL
  Filled 2020-07-31: qty 1

## 2020-07-31 MED ORDER — HYDROMORPHONE HCL 1 MG/ML IJ SOLN
INTRAMUSCULAR | Status: AC
  Filled 2020-07-31: qty 1

## 2020-07-31 MED ORDER — ORAL CARE MOUTH RINSE: 15.0000 mL | Freq: Once | OROMUCOSAL | Status: AC

## 2020-07-31 MED ORDER — FENTANYL CITRATE (PF) 100 MCG/2ML IJ SOLN
INTRAMUSCULAR | Status: AC
  Filled 2020-07-31: qty 2

## 2020-07-31 MED ORDER — IOHEXOL 300 MG/ML  SOLN
INTRAMUSCULAR | Status: DC | PRN
  Administered 2020-07-31: 9 mL via ORAL

## 2020-07-31 MED ORDER — CHLORHEXIDINE GLUCONATE 0.12 % MT SOLN
15.0000 mL | Freq: Once | OROMUCOSAL | Status: AC
  Administered 2020-07-31: 15 mL via OROMUCOSAL

## 2020-07-31 MED ORDER — TRAMADOL HCL 50 MG PO TABS: 50.0000 mg | ORAL_TABLET | Freq: Four times a day (QID) | ORAL | 0 refills | Status: AC | PRN

## 2020-07-31 MED ORDER — PROPOFOL 10 MG/ML IV BOLUS
INTRAVENOUS | Status: AC
  Filled 2020-07-31: qty 20

## 2020-07-31 MED ORDER — MIDAZOLAM HCL 5 MG/5ML IJ SOLN
INTRAMUSCULAR | Status: DC | PRN
  Administered 2020-07-31: 2 mg via INTRAVENOUS

## 2020-07-31 MED ORDER — ONDANSETRON HCL 4 MG/2ML IJ SOLN
INTRAMUSCULAR | Status: DC | PRN
  Administered 2020-07-31: 4 mg via INTRAVENOUS

## 2020-07-31 MED ORDER — ONDANSETRON HCL 4 MG/2ML IJ SOLN
INTRAMUSCULAR | Status: AC
  Filled 2020-07-31: qty 2

## 2020-07-31 SURGICAL SUPPLY — 20 items
BAG URO CATCHER STRL LF (MISCELLANEOUS) ×2 IMPLANT
BASKET ZERO TIP NITINOL 2.4FR (BASKET) IMPLANT
BSKT STON RTRVL ZERO TP 2.4FR (BASKET)
CATH URET 5FR 28IN OPEN ENDED (CATHETERS) ×2 IMPLANT
CLOTH BEACON ORANGE TIMEOUT ST (SAFETY) ×2 IMPLANT
DRSG TEGADERM 2-3/8X2-3/4 SM (GAUZE/BANDAGES/DRESSINGS) ×2 IMPLANT
EXTRACTOR STONE 1.7FRX115CM (UROLOGICAL SUPPLIES) IMPLANT
GLOVE BIO SURGEON STRL SZ 6.5 (GLOVE) ×2 IMPLANT
GOWN STRL REUS W/TWL LRG LVL3 (GOWN DISPOSABLE) ×2 IMPLANT
GUIDEWIRE STR DUAL SENSOR (WIRE) ×2 IMPLANT
KIT TURNOVER KIT A (KITS) IMPLANT
MANIFOLD NEPTUNE II (INSTRUMENTS) ×2 IMPLANT
PACK CYSTO (CUSTOM PROCEDURE TRAY) ×2 IMPLANT
SHEATH URETERAL 12FRX28CM (UROLOGICAL SUPPLIES) IMPLANT
SHEATH URETERAL 12FRX35CM (MISCELLANEOUS) IMPLANT
STENT URET 6FRX26 CONTOUR (STENTS) ×2 IMPLANT
TRACTIP FLEXIVA PULS ID 200XHI (Laser) ×1 IMPLANT
TRACTIP FLEXIVA PULSE ID 200 (Laser) ×2
TUBING CONNECTING 10 (TUBING) ×2 IMPLANT
TUBING UROLOGY SET (TUBING) ×2 IMPLANT

## 2020-07-31 NOTE — Anesthesia Procedure Notes (Signed)
Procedure Name: LMA Insertion Date/Time: 07/31/2020 9:23 AM Performed by: Anne Fu, CRNA Pre-anesthesia Checklist: Patient identified, Emergency Drugs available, Suction available, Patient being monitored and Timeout performed Patient Re-evaluated:Patient Re-evaluated prior to induction Oxygen Delivery Method: Circle system utilized Preoxygenation: Pre-oxygenation with 100% oxygen Induction Type: IV induction Ventilation: Mask ventilation without difficulty LMA: LMA inserted LMA Size: 4.0 Number of attempts: 1 Placement Confirmation: positive ETCO2 and breath sounds checked- equal and bilateral Tube secured with: Tape

## 2020-07-31 NOTE — Transfer of Care (Signed)
Immediate Anesthesia Transfer of Care Note  Patient: Patricia Erickson  Procedure(s) Performed: Procedure(s) with comments: CYSTOSCOPY/URETEROSCOPY/HOLMIUM LASER/STENT PLACEMENT (Left) - 1 MINS  Patient Location: PACU  Anesthesia Type:General  Level of Consciousness:  sedated, patient cooperative and responds to stimulation  Airway & Oxygen Therapy:Patient Spontanous Breathing and Patient connected to face mask oxgen  Post-op Assessment:  Report given to PACU RN and Post -op Vital signs reviewed and stable  Post vital signs:  Reviewed and stable  Last Vitals:  Vitals:   07/31/20 0721  BP: 115/72  Pulse: 66  Resp: 16  Temp: 36.5 C  SpO2: 829%    Complications: No apparent anesthesia complications

## 2020-07-31 NOTE — Discharge Instructions (Signed)

## 2020-07-31 NOTE — Op Note (Signed)
Preoperative diagnosis: left ureteral calculus  Postoperative diagnosis: left ureteral calculus  Procedure:  1. Cystoscopy 2. left ureteroscopy with laser lithotripsy 3. left 19F x 26cm ureteral stent placement - no string 4. left retrograde pyelography with interpretation  Surgeon: Jacalyn Lefevre, MD  Anesthesia: General  Complications: None  Intraoperative findings: left retrograde pyelography through the ureteroscope once the stone had been fragmented demonstrated hydronephrosis proximal to stone without extravasation of contrast   EBL: Minimal  Specimens: 1. left ureteral calculus  Disposition of specimens: [facility] for stone analysis  Indication: Patricia Erickson is a 49 y.o.   patient with a 1 cm left ureteral stone and associated left symptoms. After reviewing the management options for treatment, the patient elected to proceed with the above surgical procedure(s). We have discussed the potential benefits and risks of the procedure, side effects of the proposed treatment, the likelihood of the patient achieving the goals of the procedure, and any potential problems that might occur during the procedure or recuperation. Informed consent has been obtained.   Description of procedure:  The patient was taken to the operating room and general anesthesia was induced.  The patient was placed in the dorsal lithotomy position, prepped and draped in the usual sterile fashion, and preoperative antibiotics were administered. A preoperative time-out was performed.   Cystourethroscopy was performed.  The patient's urethra was examined and was normal. The bladder was then systematically examined in its entirety. There was no evidence for any bladder tumors, stones, or other mucosal pathology.    Attention then turned to the left ureteral orifice and it was noted to be heaped up consistent with UVJ calculus.  Attempts at cannulating the ureteral orifice with wire or  ureteral catheter were unsuccessful.  A semirigid ureteroscope was then used with a a 200 m laser fiber to open the ureteral orifice over the stone after which the stone was immediately encountered.  Laser lithotripsy commenced until the ureter proximal to the stone was seen and a wire was able to be placed through the ureteroscope with fluoroscopic guidance to the kidney.  The wire was secured as a safety wire.  Laser lithotripsy then continued until all stone fragments were removed.  Diagnostic ureteroscopy then took place to the proximal ureter which did not reveal any other stones or abnormalities.  A retrograde pyelogram was then obtained through the ureteroscope and no other filling defects or extravasation was noted.    The wire was then backloaded through the cystoscope and a ureteral stent was advance over the wire using Seldinger technique.  The stent was positioned appropriately under fluoroscopic and cystoscopic guidance.  The wire was then removed with an adequate stent curl noted in the renal pelvis as well as in the bladder.  The bladder was then emptied and the procedure ended.  The patient appeared to tolerate the procedure well and without complications.  The patient was able to be awakened and transferred to the recovery unit in satisfactory condition.   Disposition: The patient will be scheduled for stent removal in 2 days with cystoscopy.

## 2020-07-31 NOTE — H&P (Signed)
CC/HPI: 07/13/20 per OV with Dr. Matilde Sprang:  I was consulted to assess the patient with pelvic discomfort and frequency. She can void several times an hour. She can void twice an hour at night. She cannot hold it for 2 hours. She voids frequently due to pressure or discomfort in the vaginal area. She has a constant urge to urinate. The symptoms are not relieved by voiding. Flow is good. Sometimes she can double void. Many times she does not feel empty   She can leak with the sneeze been on every occasion. No urge incontinence. Does not wear a pad.   Has a history kidney stones And recently had I believe and pelvic ultrasound by gynecology   No dyspareunia. No hysterectomy. Bowel function normal. Smoked as a teenager. One day of local analgesia it did not help   No history of infections or bladder surgery. No neurologic issues.   urine positive. Urine sent for culture. Red blood cells and urine. bladder scan 0 mL  bladder mucosa normal. No carcinoma. Some pressure or urgency with bladder filling. 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 The rest of the trigone looked normal. It was difficult to see the ureteral orifice but it may have been at the top of the 1.5 cm swelling.   for number months patient has been highly symptomatic with vaginal discomfort frequency nocturia that is impressive. She may have interstitial cystitis. I thought was reasonable to give her an antibiotic for possible infection. Following cystoscopy I recommend CT scan hematuria.  I will see her back in the next 1 or 2 weeks with this. I gave her ciprofloxacin based upon urine analysis.    07/20/20: Patient with above noted hx. She returns today for ongoing follow up and to discuss CT imaging. Urine culture was ultimately negative. She was treated empirically with Cipro, but did not find this to beneficial for symptoms management. She was given samples  of Uribel by her gynecologist, but she states that this was not helpful for symptoms. She continues to complain of bothersome pelvic discomfort and urinary frequency. She also continues to have intermittent episodes of dysuria. She denies gross hematuria, fevers, or chills. CT impression is noted below and consistent with ureterocele finding. She also has an approximatly 1 cm calculus noted within suspected ureterocele.   IMPRESSION:  1. There is a 1.0 cm calculus in the posterior left aspect of the  urinary bladder, which on delayed phase imaging appears to be  nonmobile and located within the most distal portion of the left  ureterovesicular junction, possibly within a ureterocele.  2. The bladder is otherwise normal in appearance.  3. Punctuate nonobstructive left renal calculi. No mass or  hydronephrosis.  4. Descending and sigmoid diverticulosis without evidence of acute  diverticulitis.     ALLERGIES: Darvocet    MEDICATIONS: Cipro 250 mg tablet 1 tablet PO BID  Tramadol Hcl 50 mg tablet 1 tablet PO Q 12 H PRN     GU PSH: Cystoscopy - 07/13/2020 Locm 300-399Mg /Ml Iodine,1Ml - 07/19/2020     NON-GU PSH: Cesarean Delivery - 2000 Cholecystectomy (laparoscopic) - 2016     GU PMH: Chronic cystitis (with hematuria) - 07/13/2020 Nocturia - 07/13/2020 Stress Incontinence - 07/13/2020 Urinary Frequency - 07/13/2020 Renal calculus    NON-GU PMH: Chronic migraine without aura, not intractable, without status migrainosus    FAMILY HISTORY: 1 Daughter - Daughter Blood In Urine -  Father Pelvic floor weakness - Sister   SOCIAL HISTORY: Marital Status: Single Current Smoking Status: Patient does not smoke anymore. Has not smoked since 07/09/1985. Smoked for 5 years.   Tobacco Use Assessment Completed: Used Tobacco in last 30 days? Has never drank.  Does not drink caffeine.    REVIEW OF SYSTEMS:    GU Review Female:   Patient reports frequent urination, burning /pain with urination, and  get up at night to urinate. Patient denies hard to postpone urination, leakage of urine, stream starts and stops, trouble starting your stream, have to strain to urinate, and being pregnant.  Gastrointestinal (Upper):   Patient denies nausea, vomiting, and indigestion/ heartburn.  Gastrointestinal (Lower):   Patient denies diarrhea and constipation.  Constitutional:   Patient denies fever, night sweats, weight loss, and fatigue.  Skin:   Patient denies skin rash/ lesion and itching.  Eyes:   Patient denies blurred vision and double vision.  Ears/ Nose/ Throat:   Patient denies sore throat and sinus problems.  Hematologic/Lymphatic:   Patient denies swollen glands and easy bruising.  Cardiovascular:   Patient denies leg swelling and chest pains.  Respiratory:   Patient denies cough and shortness of breath.  Endocrine:   Patient denies excessive thirst.  Musculoskeletal:   Patient denies back pain and joint pain.  Neurological:   Patient denies headaches and dizziness.  Psychologic:   Patient denies depression and anxiety.   VITAL SIGNS:      07/20/2020 11:23 AM  Weight 173 lb / 78.47 kg  Height 66 in / 167.64 cm  BP 118/79 mmHg  Pulse 65 /min  Temperature 98.5 F / 36.9 C  BMI 27.9 kg/m   MULTI-SYSTEM PHYSICAL EXAMINATION:    Constitutional: Well-nourished. No physical deformities. Normally developed. Good grooming.  Respiratory: Normal breath sounds. No labored breathing, no use of accessory muscles.   Cardiovascular: Regular rate and rhythm. No murmur, no gallop. Normal temperature, normal extremity pulses, no swelling, no varicosities.   Neurologic / Psychiatric: Oriented to time, oriented to place, oriented to person. No depression, no anxiety, no agitation.  Gastrointestinal: No mass, no tenderness, no rigidity, non obese abdomen.  Musculoskeletal: Normal gait and station of head and neck.     Complexity of Data:  Records Review:   Previous Patient Records  Urine Test Review:    Urinalysis, Urine Culture  X-Ray Review: C.T. Hematuria: Reviewed Films. Reviewed Report.     PROCEDURES:          Urinalysis w/Scope Dipstick Dipstick Cont'd Micro  Color: Yellow Bilirubin: Neg mg/dL WBC/hpf: 0 - 5/hpf  Appearance: Clear Ketones: Neg mg/dL RBC/hpf: NS (Not Seen)  Specific Gravity: 1.025 Blood: Trace ery/uL Bacteria: NS (Not Seen)  pH: 5.5 Protein: Neg mg/dL Cystals: NS (Not Seen)  Glucose: Neg mg/dL Urobilinogen: 0.2 mg/dL Casts: NS (Not Seen)    Nitrites: Neg Trichomonas: Not Present    Leukocyte Esterase: 1+ leu/uL Mucous: Not Present      Epithelial Cells: NS (Not Seen)      Yeast: NS (Not Seen)      Sperm: Not Present    Notes: unspun microscopic performed    ASSESSMENT:      ICD-10 Details  1 GU:   Nocturia - R35.1   2   Urinary Frequency - R35.0   3   Pelvic/perineal pain - R10.2   4   Ureteral calculus - N20.1    PLAN:           Orders  Labs Urine Culture          Document Letter(s):  Created for Patient: Clinical Summary         Notes:   Precautionary culture sent today. We reviewed CT imaging today in detail. This was also reviewed with her urologist. Based on CT and cystoscopy findings, we discussed moving forward with cystoscopy, trans urethral unroofing of left ureterocele, ureteroscopy, stone manipulation with laser lithotripsy, and left ureteral stent placement. We reviewed the procedure and potential risks in detail. She voiced understanding and would like to proceed. Case reviewed with her urologist and he recommended we get her set up with one of his partners within the practice to perform the procedure. Case reviewed with Dr. Claudia Desanctis and will plan to scheduled next available. Return precautions reviewed in the interim.

## 2020-07-31 NOTE — Interval H&P Note (Signed)
History and Physical Interval Note: Patient continues to hurt in her pelvis radiating up her abdomen.  We discussed the procedure as well as the risks, benefits and alternatives.  Patient has agreed to proceed after understanding these.  She understands that I will not know the extent of the ureterocele until a cystoscopy takes place in the operating room.  The length that the stent will stay in place is also determined upon intraoperative findings.  07/31/2020 8:55 AM  Patricia Erickson  has presented today for surgery, with the diagnosis of LEFT URETEROCELE WITH CALCULUS.  The various methods of treatment have been discussed with the patient and family. After consideration of risks, benefits and other options for treatment, the patient has consented to  Procedure(s) with comments: CYSTOSCOPY/URETEROSCOPY/HOLMIUM LASER/STENT PLACEMENT (Left) - 90 MINS as a surgical intervention.  The patient's history has been reviewed, patient examined, no change in status, stable for surgery.  I have reviewed the patient's chart and labs.  Questions were answered to the patient's satisfaction.     Elsie Sakuma D Laylah Riga

## 2020-07-31 NOTE — Anesthesia Preprocedure Evaluation (Signed)
Anesthesia Evaluation  Patient identified by MRN, date of birth, ID band Patient awake    Reviewed: Allergy & Precautions, NPO status , Patient's Chart, lab work & pertinent test results  History of Anesthesia Complications Negative for: history of anesthetic complications  Airway Mallampati: II  TM Distance: >3 FB Neck ROM: Full    Dental  (+) Teeth Intact, Dental Advisory Given   Pulmonary former smoker,    Pulmonary exam normal breath sounds clear to auscultation       Cardiovascular negative cardio ROS Normal cardiovascular exam Rhythm:Regular Rate:Normal     Neuro/Psych  Headaches, PSYCHIATRIC DISORDERS Anxiety    GI/Hepatic Neg liver ROS, Cholecystitis    Endo/Other  negative endocrine ROS  Renal/GU negative Renal ROS     Musculoskeletal negative musculoskeletal ROS (+)   Abdominal   Peds  Hematology negative hematology ROS (+)   Anesthesia Other Findings Day of surgery medications reviewed with the patient.  Reproductive/Obstetrics                             Anesthesia Physical  Anesthesia Plan  ASA: II  Anesthesia Plan: General   Post-op Pain Management:    Induction: Intravenous  PONV Risk Score and Plan: 3 and Ondansetron, Dexamethasone, Midazolam and Treatment may vary due to age or medical condition  Airway Management Planned: LMA  Additional Equipment:   Intra-op Plan:   Post-operative Plan: Extubation in OR  Informed Consent: I have reviewed the patients History and Physical, chart, labs and discussed the procedure including the risks, benefits and alternatives for the proposed anesthesia with the patient or authorized representative who has indicated his/her understanding and acceptance.     Dental advisory given  Plan Discussed with: CRNA, Anesthesiologist and Surgeon  Anesthesia Plan Comments: (Risks/benefits of general anesthesia discussed with  patient including risk of damage to teeth, lips, gum, and tongue, nausea/vomiting, allergic reactions to medications, and the possibility of heart attack, stroke and death.  All patient questions answered.  Patient wishes to proceed.)        Anesthesia Quick Evaluation

## 2020-07-31 NOTE — Anesthesia Postprocedure Evaluation (Signed)
Anesthesia Post Note  Patient: Patricia Erickson  Procedure(s) Performed: CYSTOSCOPY/URETEROSCOPY/HOLMIUM LASER/STENT PLACEMENT (Left )     Patient location during evaluation: PACU Anesthesia Type: General Level of consciousness: awake and alert Pain management: pain level controlled Vital Signs Assessment: post-procedure vital signs reviewed and stable Respiratory status: spontaneous breathing, nonlabored ventilation and respiratory function stable Cardiovascular status: blood pressure returned to baseline and stable Postop Assessment: no apparent nausea or vomiting Anesthetic complications: no   No complications documented.  Last Vitals:  Vitals:   07/31/20 1100 07/31/20 1115  BP: 126/77   Pulse: 64   Resp: 15 16  Temp: 36.5 C   SpO2: 96%     Last Pain:  Vitals:   07/31/20 1115  TempSrc:   PainSc: [place]

## 2020-08-01 ENCOUNTER — Encounter (HOSPITAL_COMMUNITY): Payer: Self-pay | Admitting: Urology

## 2021-03-05 ENCOUNTER — Other Ambulatory Visit: Payer: Self-pay | Admitting: Otolaryngology

## 2021-03-05 DIAGNOSIS — K118 Other diseases of salivary glands: Secondary | ICD-10-CM

## 2021-03-21 ENCOUNTER — Ambulatory Visit
Admission: RE | Admit: 2021-03-21 | Discharge: 2021-03-21 | Disposition: A | Discharge status: Home / Self Care | Payer: BC Managed Care – PPO | Source: Ambulatory Visit | Attending: Otolaryngology | Admitting: Otolaryngology

## 2021-03-21 ENCOUNTER — Other Ambulatory Visit: Payer: Self-pay

## 2021-03-21 DIAGNOSIS — K118 Other diseases of salivary glands: Secondary | ICD-10-CM

## 2021-03-21 MED ORDER — IOPAMIDOL (ISOVUE-300) INJECTION 61%
75.0000 mL | Freq: Once | INTRAVENOUS | Status: AC | PRN
  Administered 2021-03-21: 75 mL via INTRAVENOUS

## 2021-05-14 ENCOUNTER — Other Ambulatory Visit: Payer: Self-pay | Admitting: Obstetrics and Gynecology

## 2021-05-14 DIAGNOSIS — Z1231 Encounter for screening mammogram for malignant neoplasm of breast: Secondary | ICD-10-CM

## 2021-05-16 ENCOUNTER — Other Ambulatory Visit: Payer: Self-pay

## 2021-05-16 ENCOUNTER — Ambulatory Visit
Admission: RE | Admit: 2021-05-16 | Discharge: 2021-05-16 | Disposition: A | Discharge status: Home / Self Care | Payer: BC Managed Care – PPO | Source: Ambulatory Visit | Attending: Obstetrics and Gynecology | Admitting: Obstetrics and Gynecology

## 2021-05-16 DIAGNOSIS — Z1231 Encounter for screening mammogram for malignant neoplasm of breast: Secondary | ICD-10-CM

## 2021-06-29 ENCOUNTER — Emergency Department (HOSPITAL_COMMUNITY)
Admission: EM | Admit: 2021-06-29 | Discharge: 2021-06-29 | Disposition: A | Discharge status: Home / Self Care | Payer: BC Managed Care – PPO | Attending: Emergency Medicine | Admitting: Emergency Medicine

## 2021-06-29 ENCOUNTER — Emergency Department (HOSPITAL_COMMUNITY): Payer: BC Managed Care – PPO

## 2021-06-29 ENCOUNTER — Encounter (HOSPITAL_COMMUNITY): Payer: Self-pay

## 2021-06-29 ENCOUNTER — Other Ambulatory Visit: Payer: Self-pay

## 2021-06-29 ENCOUNTER — Emergency Department (HOSPITAL_BASED_OUTPATIENT_CLINIC_OR_DEPARTMENT_OTHER)
Admit: 2021-06-29 | Discharge: 2021-06-29 | Disposition: A | Discharge status: Home / Self Care | Payer: BC Managed Care – PPO | Attending: Emergency Medicine | Admitting: Emergency Medicine

## 2021-06-29 DIAGNOSIS — N898 Other specified noninflammatory disorders of vagina: Secondary | ICD-10-CM | POA: Insufficient documentation

## 2021-06-29 DIAGNOSIS — Z8616 Personal history of COVID-19: Secondary | ICD-10-CM | POA: Diagnosis not present

## 2021-06-29 DIAGNOSIS — Z87891 Personal history of nicotine dependence: Secondary | ICD-10-CM | POA: Insufficient documentation

## 2021-06-29 DIAGNOSIS — R072 Precordial pain: Secondary | ICD-10-CM | POA: Insufficient documentation

## 2021-06-29 DIAGNOSIS — M79605 Pain in left leg: Secondary | ICD-10-CM | POA: Insufficient documentation

## 2021-06-29 DIAGNOSIS — R0789 Other chest pain: Secondary | ICD-10-CM

## 2021-06-29 HISTORY — DX: COVID-19: U07.1

## 2021-06-29 LAB — CBC
HCT: 41.8 % (ref 36.0–46.0)
Hemoglobin: 13.7 g/dL (ref 12.0–15.0)
MCH: 29.8 pg (ref 26.0–34.0)
MCHC: 32.8 g/dL (ref 30.0–36.0)
MCV: 90.9 fL (ref 80.0–100.0)
Platelets: 272 10*3/uL (ref 150–400)
RBC: 4.6 MIL/uL (ref 3.87–5.11)
RDW: 14 % (ref 11.5–15.5)
WBC: 6.8 10*3/uL (ref 4.0–10.5)
nRBC: 0 % (ref 0.0–0.2)

## 2021-06-29 LAB — BASIC METABOLIC PANEL
Anion gap: 7 (ref 5–15)
BUN: 22 mg/dL — ABNORMAL HIGH (ref 6–20)
CO2: 24 mmol/L (ref 22–32)
Calcium: 9 mg/dL (ref 8.9–10.3)
Chloride: 110 mmol/L (ref 98–111)
Creatinine, Ser: 0.83 mg/dL (ref 0.44–1.00)
GFR, Estimated: >60 mL/min (ref >60)
Glucose, Bld: 91 mg/dL (ref 70–99)
Potassium: 4.1 mmol/L (ref 3.5–5.1)
Sodium: 141 mmol/L (ref 135–145)

## 2021-06-29 LAB — I-STAT BETA HCG BLOOD, ED (MC, WL, AP ONLY): I-stat hCG, quantitative: <5 m[IU]/mL (ref <5)

## 2021-06-29 LAB — TROPONIN I (HIGH SENSITIVITY): Troponin I (High Sensitivity): <2 ng/L (ref <18)

## 2021-06-29 NOTE — Discharge Instructions (Addendum)
You were seen in the emergency department for chest pressure and left leg pain.  You had blood work EKG chest x-ray and an ultrasound of your leg that did not show an obvious explanation for your symptoms.  There was no evidence of blood clot.  Please follow-up with your primary care doctor.  Return to the emergency department if any worsening or concerning symptoms.

## 2021-06-29 NOTE — Progress Notes (Signed)
Left lower extremity venous duplex has been completed. Preliminary results can be found in CV Proc through chart review.  Results were given to Dr. Melina Copa.  06/29/21 11:12 AM Patricia Erickson RVT

## 2021-06-29 NOTE — ED Provider Notes (Signed)
[facility] DEPT Provider Note   CSN: [id number] Arrival date & time: 06/29/21  [id number]     History Chief Complaint  Patient presents with   Leg Pain   Chest Pain    Patricia Erickson is a 50 y.o. female.  She has no significant past medical history.  She had COVID recently.  Complaining of 1 week of chest pressure.  Denies feeling short of breath.  She is also had few days of some left leg pain.  This morning she noticed a dilated vein on her lower leg and was concerned she might have a blood clot.  No fevers or chills.  No known cardiac disease.  No abdominal pain vomiting or diarrhea.  The history is provided by the patient.  Chest Pain Pain location:  Substernal area Pain quality: pressure   Pain radiates to:  Does not radiate Pain severity:  Moderate Onset quality:  Gradual Duration:  10 days Timing:  Intermittent Progression:  Unchanged Chronicity:  New Relieved by:  None tried Worsened by:  Nothing Ineffective treatments:  None tried Associated symptoms: no abdominal pain, no back pain, no cough, no diaphoresis, no fever, no headache, no nausea, no shortness of breath and no vomiting       Past Medical History:  Diagnosis Date   COVID    History of kidney stones    Migraines    hz of migraines    Polyp of colon 03/11/2007   BENIGN    Patient Active Problem List   Diagnosis Date Noted   Vaginal discharge 03/19/2017   Vaginal burning 03/19/2017   Flu-like symptoms 01/02/2017   Genetic testing 12/18/2015   Gallstones 10/17/2015   Symptomatic cholelithiasis 10/17/2015   Healthcare maintenance 07/05/2015   Intermittent palpitations 07/05/2015   Family history of colon cancer 10/12/2012   Family history of breast cancer in female 10/12/2012   MIGRAINE HEADACHE 01/19/2009   HEMORRHOIDS 01/10/2009   COLONIC POLYPS, HX OF 01/10/2009    Past Surgical History:  Procedure Laterality Date   CESAREAN SECTION  2000   CHOLECYSTECTOMY  N/A 10/18/2015   Procedure: LAPAROSCOPIC CHOLECYSTECTOMY;  Surgeon: Mickeal Skinner, MD;  Location: [facility];  Service: General;  Laterality: N/A;   CYSTOSCOPY/URETEROSCOPY/HOLMIUM LASER/STENT PLACEMENT Left 07/31/2020   Procedure: CYSTOSCOPY/URETEROSCOPY/HOLMIUM LASER/STENT PLACEMENT;  Surgeon: Robley Fries, MD;  Location: [facility];  Service: Urology;  Laterality: Left;  69 MINS   LAPAROSCOPIC CHOLECYSTECTOMY  nov 9 4     OB History     Gravida  2   Para  1   Term  1   Preterm      AB  1   Living  1      SAB  1   IAB      Ectopic      Multiple      Live Births  1           Family History  Problem Relation Age of Onset   Osteoporosis Mother    Breast cancer Mother 73   Colon cancer Mother 38       removed large intestine   Lymphoma Mother 67   Hypertension Father    Osteoporosis Maternal Grandmother    Lymphoma Maternal Grandmother 15   Colon polyps Sister        "several" dx. 43 or younger   Other Sister        hysterectomy at 29; suspicious breast finding removed at 19   Colon  cancer Maternal Aunt 43   Lymphoma Maternal Uncle 63   Lung cancer Maternal Grandfather 66       metastatic to bone; former smoker   Throat cancer Paternal Grandmother 67       not a smoker   Heart Problems Paternal Grandfather    Other Other        suspicious breast finding at 65 - recent follow-up normal   Heart Problems Maternal Uncle    Bladder Cancer Paternal Uncle 5   Throat cancer Paternal Uncle        dx. before age 24    Social History   Tobacco Use   Smoking status: Former    Packs/day: 0.50    Years: 7.00    Pack years: 3.50    Types: Cigarettes    Quit date: 10/12/1992    Years since quitting: 28.7   Smokeless tobacco: Never  Vaping Use   Vaping Use: Never used  Substance Use Topics   Alcohol use: Not Currently    Alcohol/week: 0.0 standard drinks   Drug use: No    Home Medications Prior to Admission medications   Medication Sig Start Date  End Date Taking? Authorizing Provider  Bacillus Coagulans-Inulin (PROBIOTIC-PREBIOTIC PO) Take 3.6 g by mouth daily.    [provider]  baclofen (LIORESAL) 10 MG tablet Take 10 mg by mouth 2 (two) times daily as needed (Migraine).  07/21/19   [provider]  chlorproMAZINE (THORAZINE) 25 MG tablet Take 25 mg by mouth 4 (four) times daily as needed (migraine).  07/11/19   [provider]  COLLAGEN PO Take 15 mLs by mouth in the morning and at bedtime. Liquid    [provider]  OVER THE COUNTER MEDICATION Take 3.6 g by mouth See admin instructions. Phyto Green &  Phyto Gold 3.6 g per scoop One of each daily    [provider]  oxybutynin (DITROPAN) 5 MG tablet Take 1 tablet (5 mg total) by mouth every 8 (eight) hours as needed for bladder spasms. 07/31/20   Robley Fries, MD  tamsulosin (FLOMAX) 0.4 MG CAPS capsule Take 1 capsule (0.4 mg total) by mouth daily. 07/31/20   Robley Fries, MD  topiramate (TOPAMAX) 200 MG tablet Take 300 mg by mouth at bedtime.     [provider]  traMADol (ULTRAM) 50 MG tablet Take 50 mg by mouth every 12 (twelve) hours as needed. 07/13/20   [provider]  traMADol (ULTRAM) 50 MG tablet Take 1 tablet (50 mg total) by mouth every 6 (six) hours as needed. 07/31/20 07/31/21  Robley Fries, MD  prochlorperazine (COMPAZINE) 10 MG tablet Take 1 tablet (10 mg total) by mouth 2 (two) times daily as needed for nausea or vomiting. 07/16/18 08/26/19  Tegeler, Gwenyth Allegra, MD    Allergies    Propoxyphene n-acetaminophen  Review of Systems   Review of Systems  Constitutional:  Negative for diaphoresis and fever.  HENT:  Negative for sore throat.   Eyes:  Negative for visual disturbance.  Respiratory:  Negative for cough and shortness of breath.   Cardiovascular:  Positive for chest pain.  Gastrointestinal:  Negative for abdominal pain, nausea and vomiting.  Genitourinary:  Negative for dysuria.   Musculoskeletal:  Negative for back pain.  Skin:  Negative for rash.  Neurological:  Negative for headaches.   Physical Exam Updated Vital Signs BP 119/80 (BP Location: Left Arm)   Pulse 79   Temp 98.5 F (36.9  C) (Oral)   Resp 17   Ht '5\' 6"'$  (1.676 m)   Wt 78 kg   SpO2 99%   BMI 27.76 kg/m   Physical Exam Vitals and nursing note reviewed.  Constitutional:      General: She is not in acute distress.    Appearance: She is well-developed.  HENT:     Head: Normocephalic and atraumatic.  Eyes:     Conjunctiva/sclera: Conjunctivae normal.  Cardiovascular:     Rate and Rhythm: Normal rate and regular rhythm.     Heart sounds: No murmur heard. Pulmonary:     Effort: Pulmonary effort is normal. No respiratory distress.     Breath sounds: Normal breath sounds.  Abdominal:     Palpations: Abdomen is soft.     Tenderness: There is no abdominal tenderness.  Musculoskeletal:        General: Normal range of motion.     Cervical back: Neck supple.     Right lower leg: No tenderness. No edema.     Left lower leg: No tenderness. No edema.  Skin:    General: Skin is warm and dry.  Neurological:     General: No focal deficit present.     Mental Status: She is alert.    ED Results / Procedures / Treatments   Labs (all labs ordered are listed, but only abnormal results are displayed) Labs Reviewed  BASIC METABOLIC PANEL - Abnormal; Notable for the following components:      Result Value   BUN 22 (*)    All other components within normal limits  CBC  I-STAT BETA HCG BLOOD, ED ([facility], [facility], [facility] ONLY)  TROPONIN I (HIGH SENSITIVITY)  TROPONIN I (HIGH SENSITIVITY)    EKG EKG Interpretation  Date/Time:  Friday June 29 2021 09:57:47 EDT Ventricular Rate:  79 PR Interval:  159 QRS Duration: 81 QT Interval:  353 QTC Calculation: 405 R Axis:   60 Text Interpretation: Sinus rhythm No significant change since prior 12/20 Confirmed by Aletta Edouard [id number]) on 06/29/2021 10:20:34  AM  Radiology DG Chest 2 View  Result Date: 06/29/2021 CLINICAL DATA:  Chest pain for 2 weeks. EXAM: CHEST - 2 VIEW COMPARISON:  None. FINDINGS: Artifact from EKG leads. Cholecystectomy clips. Normal heart size and mediastinal contours. No acute infiltrate or edema. No effusion or pneumothorax. No acute osseous findings. IMPRESSION: No active cardiopulmonary disease. Electronically Signed   By: Monte Fantasia M.D.   On: 06/29/2021 11:11   VAS [place] LOWER EXTREMITY VENOUS (DVT) ([facility] and [facility] 7a-7p)  Result Date: 06/29/2021  Lower Venous DVT Study Patient Name:  Patricia Erickson  Date of Exam:   06/29/2021 Medical Rec #: [id number]          Accession #:    [id number] Date of Birth: Apr 19, 1971          Patient Gender: F Patient Age:   50Y Exam Location:  [facility] Procedure:      VAS [place] LOWER EXTREMITY VENOUS (DVT) Referring Phys: [id number] Natalea Sutliff C Earl Losee --------------------------------------------------------------------------------  Indications: Pain.  Risk Factors: None identified. Comparison Study: No prior studies. Performing Technologist: Oliver Hum RVT  Examination Guidelines: A complete evaluation includes B-mode imaging, spectral Doppler, color Doppler, and power Doppler as needed of all accessible portions of each vessel. Bilateral testing is considered an integral part of a complete examination. Limited examinations for reoccurring indications may be performed as noted. The reflux portion of the exam is performed with the patient in reverse  Trendelenburg.  +-----+---------------+---------+-----------+----------+--------------+ RIGHTCompressibilityPhasicitySpontaneityPropertiesThrombus Aging +-----+---------------+---------+-----------+----------+--------------+ CFV  Full           Yes      Yes                                 +-----+---------------+---------+-----------+----------+--------------+   +---------+---------------+---------+-----------+----------+--------------+  LEFT     CompressibilityPhasicitySpontaneityPropertiesThrombus Aging +---------+---------------+---------+-----------+----------+--------------+ CFV      Full           Yes      Yes                                 +---------+---------------+---------+-----------+----------+--------------+ SFJ      Full                                                        +---------+---------------+---------+-----------+----------+--------------+ FV Prox  Full                                                        +---------+---------------+---------+-----------+----------+--------------+ FV Mid   Full                                                        +---------+---------------+---------+-----------+----------+--------------+ FV DistalFull                                                        +---------+---------------+---------+-----------+----------+--------------+ PFV      Full                                                        +---------+---------------+---------+-----------+----------+--------------+ POP      Full           Yes      Yes                                 +---------+---------------+---------+-----------+----------+--------------+ PTV      Full                                                        +---------+---------------+---------+-----------+----------+--------------+ PERO     Full                                                        +---------+---------------+---------+-----------+----------+--------------+  Summary: RIGHT: - No evidence of common femoral vein obstruction.  LEFT: - There is no evidence of deep vein thrombosis in the lower extremity.  - No cystic structure found in the popliteal fossa.  *See table(s) above for measurements and observations. Electronically signed by Jamelle Haring on 06/29/2021 at 4:41:02 PM.    Final     Procedures Procedures   Medications Ordered in ED Medications - No data to  display  ED Course  I have reviewed the triage vital signs and the nursing notes.  Pertinent labs & imaging results that were available during my care of the patient were reviewed by me and considered in my medical decision making (see chart for details).  Clinical Course as of 06/29/21 1846  Fri Jun 29, 2021  1121 Reviewed work-up with patient.  She is comfortable plan for outpatient follow-up with her PCP.  Return instructions. [MB]    Clinical Course User Index [MB] Hayden Rasmussen, MD   MDM Rules/Calculators/A&P                          This patient complains of chest pressure left leg vein engorged; this involves an extensive number of treatment Options and is a complaint that carries with it a high risk of complications and Morbidity. The differential includes CHF, COPD, pneumonia, COVID, DVT, PE, post-COVID symptoms, peripheral edema  I ordered, reviewed and interpreted labs, which included CBC with normal white count normal hemoglobin, chemistries normal, troponin unremarkable does not need a delta, pregnancy test negative I ordered imaging studies which included chest x-ray and venous duplex left lower extremity and I independently    visualized and interpreted imaging which showed no acute findings  Previous records obtained and reviewed in epic no recent admissions  After the interventions stated above, I reevaluated the patient and found patient to be satting under percent on room air.  No evidence of DVT.  Do not feel needs further work-up for PE.  Recommended follow-up with PCP.  Return instructions discussed   Final Clinical Impression(s) / ED Diagnoses Final diagnoses:  Chest pressure  Left leg pain    Rx / DC Orders ED Discharge Orders     None        Hayden Rasmussen, MD 06/29/21 1850

## 2021-06-29 NOTE — ED Triage Notes (Signed)
Patient c/o left foot pain x 1 week. Patient states she has a sore area on her calf with heat that woke her up this AM.  Patient also c/o  constant chest pressure x 1 week. Patient denies SOB.

## 2021-06-29 NOTE — ED Provider Notes (Signed)
Emergency Medicine Provider Triage Evaluation Note  Patricia Erickson , a 50 y.o. female  was evaluated in triage.  Pt complains of 1 week of chest pressure and 1 day of left leg pain.  Review of Systems  Positive: Chest pressure Negative: Shortness of breath  Physical Exam  BP 119/80 (BP Location: Left Arm)   Pulse 79   Temp 98.5 F (36.9 C) (Oral)   Resp 17   Ht '5\' 6"'$  (1.676 m)   Wt 78 kg   SpO2 99%   BMI 27.76 kg/m  Gen:   Awake, no distress   Resp:  Normal effort  MSK:   Moves extremities without difficulty  Other:  No cords appreciated  Medical Decision Making  Medically screening exam initiated at 10:21 AM.  Appropriate orders placed.  Patricia Erickson was informed that the remainder of the evaluation will be completed by another provider, this initial triage assessment does not replace that evaluation, and the importance of remaining in the ED until their evaluation is complete.     Patricia Rasmussen, MD 06/29/21 1021

## 2022-02-21 ENCOUNTER — Encounter: Payer: Self-pay | Admitting: Family Medicine

## 2022-02-21 ENCOUNTER — Ambulatory Visit (INDEPENDENT_AMBULATORY_CARE_PROVIDER_SITE_OTHER): Payer: 59 | Admitting: Family Medicine

## 2022-02-21 VITALS — BP 92/70 | Ht 66.0 in | Wt 185.0 lb

## 2022-02-21 DIAGNOSIS — M5412 Radiculopathy, cervical region: Secondary | ICD-10-CM | POA: Diagnosis not present

## 2022-02-21 DIAGNOSIS — M5416 Radiculopathy, lumbar region: Secondary | ICD-10-CM | POA: Diagnosis not present

## 2022-02-21 MED ORDER — PREDNISONE 5 MG PO TABS: ORAL_TABLET | ORAL | 0 refills | Status: DC

## 2022-02-21 NOTE — Patient Instructions (Signed)
Nice to meet you ?Please try heat  ?Please try the exercises   ?Please send me a message in MyChart with any questions or updates.  ?Please see me back in 3-4 weeks.  ? ?--Dr. Raeford Razor ? ?

## 2022-02-21 NOTE — Assessment & Plan Note (Signed)
Acutely occurring.  Having sensations down the right arm and shoulder secondary to the leg paresthesias.  She does work long hours.  Normal neurological exam. ?-Counseled on home exercise therapy and supportive care. ?-Prednisone. ?-Could consider further imaging or physical therapy. ?

## 2022-02-21 NOTE — Assessment & Plan Note (Signed)
Acutely occurring.  Having sensation down the right lateral leg.  Seems most consistent with a nerve impingement. ?-Counseled on home exercise therapy and supportive care. ?-Prednisone. ?-Could consider further imaging or physical therapy. ?

## 2022-02-21 NOTE — Progress Notes (Signed)
?  Patricia Erickson - 51 y.o. female MRN [id number]  Date of birth: November 23, 1971 ? ?SUBJECTIVE:  Including CC & ROS.  ?No chief complaint on file. ? ? ?Patricia Erickson is a 51 y.o. female that is presenting with right neck and arm UltraCision as well as right leg altered sensation.  Symptoms been present for about 2 months.  No inciting event.  Has not tried anything.  No history of surgery.  Symptoms are becoming more apparent. ? ? ?Review of Systems ?See HPI  ? ?HISTORY: Past Medical, Surgical, Social, and Family History Reviewed & Updated per EMR.   ?Pertinent Historical Findings include: ? ?Past Medical History:  ?Diagnosis Date  ? COVID   ? History of kidney stones   ? Migraines   ? hz of migraines   ? Polyp of colon 03/11/2007  ? BENIGN  ? ? ?Past Surgical History:  ?Procedure Laterality Date  ? CESAREAN SECTION  2000  ? CHOLECYSTECTOMY N/A 10/18/2015  ? Procedure: LAPAROSCOPIC CHOLECYSTECTOMY;  Surgeon: Mickeal Skinner, MD;  Location: [facility];  Service: General;  Laterality: N/A;  ? CYSTOSCOPY/URETEROSCOPY/HOLMIUM LASER/STENT PLACEMENT Left 07/31/2020  ? Procedure: CYSTOSCOPY/URETEROSCOPY/HOLMIUM LASER/STENT PLACEMENT;  Surgeon: Robley Fries, MD;  Location[facility];  Service: Urology;  Laterality: Left;  90 MINS  ? LAPAROSCOPIC CHOLECYSTECTOMY  nov 9 16  ? ? ? ?PHYSICAL EXAM:  ?VS: BP 92/70 (BP Location: Left Arm, Patient Position: Sitting)   Ht '5\' 6"'$  (1.676 m)   Wt 185 lb (83.9 kg)   BMI 29.86 kg/m?  ?Physical Exam ?Gen: NAD, alert, cooperative with exam, well-appearing ?MSK: ?Neurovascularly intact   ? ? ? ? ?ASSESSMENT & PLAN:  ? ?Lumbar radiculopathy ?Acutely occurring.  Having sensation down the right lateral leg.  Seems most consistent with a nerve impingement. ?-Counseled on home exercise therapy and supportive care. ?-Prednisone. ?-Could consider further imaging or physical therapy. ? ?Cervical radiculopathy ?Acutely occurring.  Having sensations down the right arm and shoulder secondary to the  leg paresthesias.  She does work long hours.  Normal neurological exam. ?-Counseled on home exercise therapy and supportive care. ?-Prednisone. ?-Could consider further imaging or physical therapy. ? ? ? ? ?

## 2022-03-02 ENCOUNTER — Encounter: Payer: Self-pay | Admitting: Family Medicine

## 2022-03-07 DIAGNOSIS — M545 Low back pain, unspecified: Secondary | ICD-10-CM | POA: Insufficient documentation

## 2022-03-19 ENCOUNTER — Ambulatory Visit: Payer: 59 | Admitting: Family Medicine

## 2022-04-02 ENCOUNTER — Ambulatory Visit: Payer: 59 | Admitting: Physician Assistant

## 2022-04-02 ENCOUNTER — Encounter: Payer: Self-pay | Admitting: Physician Assistant

## 2022-04-02 VITALS — BP 110/72 | HR 68 | Temp 98.5°F | Ht 66.0 in | Wt 193.6 lb

## 2022-04-02 DIAGNOSIS — G43009 Migraine without aura, not intractable, without status migrainosus: Secondary | ICD-10-CM

## 2022-04-02 DIAGNOSIS — M5416 Radiculopathy, lumbar region: Secondary | ICD-10-CM

## 2022-04-02 MED ORDER — UBRELVY 100 MG PO TABS: 100.0000 mg | ORAL_TABLET | Freq: Once | ORAL | 0 refills | Status: DC | PRN

## 2022-04-02 NOTE — Progress Notes (Signed)
? ?New Patient Office Visit ? ?Subjective:  ?Patient ID: Patricia Erickson, female    DOB: 07-Aug-1971  Age: 51 y.o. MRN: [id number] ? ?CC:  ?Chief Complaint  ?Patient presents with  ? migraines  ? ? ?HPI ?Patricia Erickson presents for history of migraine headaches.  She states she has had these for years and currently [facility] with [facility].  She is currently on baclofen and thorazine as needed as rescue medication as well as topamax '200mg'$  qd ? ?Pt is currently seeing Dr Maxie Better at [facility] orth[facility] for low back pain and just completed prednisone - will be scheduled for injection therapy ? ?Past Medical History:  ?Diagnosis Date  ? COVID   ? History of kidney stones   ? Migraines   ? hz of migraines   ? Polyp of colon 03/11/2007  ? BENIGN  ? ? ?Past Surgical History:  ?Procedure Laterality Date  ? CESAREAN SECTION  2000  ? CHOLECYSTECTOMY N/A 10/18/2015  ? Procedure: LAPAROSCOPIC CHOLECYSTECTOMY;  Surgeon: Mickeal Skinner, MD;  Location: [facility];  Service: General;  Laterality: N/A;  ? CYSTOSCOPY/URETEROSCOPY/HOLMIUM LASER/STENT PLACEMENT Left 07/31/2020  ? Procedure: CYSTOSCOPY/URETEROSCOPY/HOLMIUM LASER/STENT PLACEMENT;  Surgeon: Robley Fries, MD;  Location: [facility];  Service: Urology;  Laterality: Left;  90 MINS  ? LAPAROSCOPIC CHOLECYSTECTOMY  nov 9 16  ? ? ?Family History  ?Problem Relation Age of Onset  ? Osteoporosis Mother   ? Breast cancer Mother 57  ? Colon cancer Mother 31  ?     removed large intestine  ? Lymphoma Mother 32  ? Hypertension Father   ? Osteoporosis Maternal Grandmother   ? Lymphoma Maternal Grandmother 63  ? Colon polyps Sister   ?     "several" dx. 40 or younger  ? Other Sister   ?     hysterectomy at 18; suspicious breast finding removed at 49  ? Colon cancer Maternal Aunt 39  ? Lymphoma Maternal Uncle 55  ? Lung cancer Maternal Grandfather 39  ?     metastatic to bone; former smoker  ? Throat cancer Paternal Grandmother 83  ?      not a smoker  ? Heart Problems Paternal Grandfather   ? Other Other   ?     suspicious breast finding at 1 - recent follow-up normal  ? Heart Problems Maternal Uncle   ? Bladder Cancer Paternal Uncle 70  ? Throat cancer Paternal Uncle   ?     dx. before age 37  ? ? ?Social History  ? ?Socioeconomic History  ? Marital status: Divorced  ?  Spouse name: Not on file  ? Number of children: Not on file  ? Years of education: 49  ? Highest education level: Not on file  ?Occupational History  ? Not on file  ?Tobacco Use  ? Smoking status: Former  ?  Packs/day: 0.50  ?  Years: 7.00  ?  Pack years: 3.50  ?  Types: Cigarettes  ?  Quit date: 10/12/1992  ?  Years since quitting: 29.4  ? Smokeless tobacco: Never  ?Vaping Use  ? Vaping Use: Never used  ?Substance and Sexual Activity  ? Alcohol use: Not Currently  ?  Alcohol/week: 0.0 standard drinks  ? Drug use: No  ? Sexual activity: Not Currently  ?  Birth control/protection: I.U.D.  ?  Comment: Mirena 10/29/2012  ?Other Topics Concern  ? Not on file  ?Social History Narrative  ?  Not on file  ? ?Social Determinants of Health  ? ?Financial Resource Strain: Not on file  ?Food Insecurity: Not on file  ?Transportation Needs: Not on file  ?Physical Activity: Not on file  ?Stress: Not on file  ?Social Connections: Not on file  ?Intimate Partner Violence: Not on file  ? ? ? ?Current Outpatient Medications:  ?  Bacillus Coagulans-Inulin (PROBIOTIC-PREBIOTIC PO), Take 3.6 g by mouth daily., Disp: , Rfl:  ?  baclofen (LIORESAL) 10 MG tablet, Take 10 mg by mouth 2 (two) times daily as needed (Migraine). , Disp: , Rfl:  ?  chlorproMAZINE (THORAZINE) 25 MG tablet, Take 25 mg by mouth 4 (four) times daily as needed (migraine). , Disp: , Rfl:  ?  topiramate (TOPAMAX) 200 MG tablet, Take 300 mg by mouth at bedtime. , Disp: , Rfl:  ?  Ubrogepant (UBRELVY) 100 MG TABS, Take 100 mg by mouth once as needed for up to 1 dose (headache)., Disp: 2 tablet, Rfl: 0 ? ? ?Allergies  ?Allergen Reactions  ?  Other Other (See Comments)  ? Propoxyphene N-Acetaminophen Nausea And Vomiting  ? ? ?ROS ?CONSTITUTIONAL: Negative for chills, fatigue, fever, unintentional weight gain and unintentional weight loss.  ?CARDIOVASCULAR: Negative for chest pain, dizziness, palpitations and pedal edema.  ?RESPIRATORY: Negative for recent cough and dyspnea.  ?GASTROINTESTINAL: Negative for abdominal pain, acid reflux symptoms, constipation, diarrhea, nausea and vomiting.  ?MSK: see HPI ?INTEGUMENTARY: Negative for rash.  ?NEUROLOGICAL:see HPI ? ?   ? ?  ?Objective:  ?  ?PHYSICAL EXAM:  ? ?VS: BP 110/72   Pulse 68   Temp 98.5 ?F (36.9 ?C)   Ht '5\' 6"'$  (1.676 m)   Wt 193 lb 9.6 oz (87.8 kg)   SpO2 98%   BMI 31.25 kg/m?  ? ?GEN: Well nourished, well developed, in no acute distress  ?Cardiac: RRR; no murmurs, ?Respiratory:  normal respiratory rate and pattern with no distress - normal breath sounds with no rales, rhonchi, wheezes or rubs ?Skin: warm and dry, no rash  ?Psych: euthymic mood, appropriate affect and demeanor ? ?BP 110/72   Pulse 68   Temp 98.5 ?F (36.9 ?C)   Ht '5\' 6"'$  (1.676 m)   Wt 193 lb 9.6 oz (87.8 kg)   SpO2 98%   BMI 31.25 kg/m?  ?Wt Readings from Last 3 Encounters:  ?04/02/22 193 lb 9.6 oz (87.8 kg)  ?02/21/22 185 lb (83.9 kg)  ?06/29/21 172 lb (78 kg)  ? ? ? ?Health Maintenance Due  ?Topic Date Due  ? PAP SMEAR-Modifier  12/03/2020  ? ? ?There are no preventive care reminders to display for this patient. ? ?Lab Results  ?Component Value Date  ? TSH 0.72 11/21/2017  ? ?Lab Results  ?Component Value Date  ? WBC 6.8 06/29/2021  ? HGB 13.7 06/29/2021  ? HCT 41.8 06/29/2021  ? MCV 90.9 06/29/2021  ? PLT 272 06/29/2021  ? ?Lab Results  ?Component Value Date  ? NA 141 06/29/2021  ? K 4.1 06/29/2021  ? CO2 24 06/29/2021  ? GLUCOSE 91 06/29/2021  ? BUN 22 (H) 06/29/2021  ? CREATININE 0.83 06/29/2021  ? BILITOT 0.1 (L) 12/06/2019  ? ALKPHOS 80 12/06/2019  ? AST 21 12/06/2019  ? ALT 30 12/06/2019  ? PROT 6.7 12/06/2019  ?  ALBUMIN 3.9 12/06/2019  ? CALCIUM 9.0 06/29/2021  ? ANIONGAP 7 06/29/2021  ? ?Lab Results  ?Component Value Date  ? CHOL 170 11/18/2016  ? ?Lab Results  ?Component Value Date  ?  HDL 54 11/18/2016  ? ?Lab Results  ?Component Value Date  ? LDLCALC 102 (H) 11/18/2016  ? ?Lab Results  ?Component Value Date  ? TRIG 70 11/18/2016  ? ?Lab Results  ?Component Value Date  ? CHOLHDL 3.1 11/18/2016  ? ?Lab Results  ?Component Value Date  ? HGBA1C 5.5 11/21/2017  ? ? ?  ?Assessment & Plan:  ? ?Problem List Items Addressed This Visit   ? ?  ? Cardiovascular and Mediastinum  ? Migraine headache - Primary ?Recommend to try ubrelvy as rescue for migraines and continue topamax - samples given  ?  ? Nervous and Auditory  ? Lumbar radiculopathy ?Continue follow up with ortho  ? ? ?Meds ordered this encounter  ?Medications  ? Ubrogepant (UBRELVY) 100 MG TABS  ?  Sig: Take 100 mg by mouth once as needed for up to 1 dose (headache).  ?  Dispense:  2 tablet  ?  Refill:  0  ?  Order Specific Question:   Supervising Provider  ?  AnswerRochel Brome [[id number]]  ? ? ?Follow-up: Return for schedule fasting wellness.  ? ? ?SARA R Lindzey Zent, PA-C ?

## 2022-04-11 ENCOUNTER — Other Ambulatory Visit: Payer: 59

## 2022-05-08 ENCOUNTER — Encounter: Payer: 59 | Admitting: Physician Assistant

## 2022-05-13 ENCOUNTER — Encounter (HOSPITAL_COMMUNITY): Payer: Self-pay

## 2022-05-13 ENCOUNTER — Emergency Department (HOSPITAL_COMMUNITY)
Admission: EM | Admit: 2022-05-13 | Discharge: 2022-05-13 | Disposition: A | Discharge status: Home / Self Care | Payer: 59 | Attending: Emergency Medicine | Admitting: Emergency Medicine

## 2022-05-13 ENCOUNTER — Emergency Department (HOSPITAL_BASED_OUTPATIENT_CLINIC_OR_DEPARTMENT_OTHER): Admit: 2022-05-13 | Discharge: 2022-05-13 | Disposition: A | Discharge status: Home / Self Care | Payer: 59

## 2022-05-13 DIAGNOSIS — Z8616 Personal history of COVID-19: Secondary | ICD-10-CM | POA: Diagnosis not present

## 2022-05-13 DIAGNOSIS — M7989 Other specified soft tissue disorders: Secondary | ICD-10-CM | POA: Insufficient documentation

## 2022-05-13 DIAGNOSIS — M79604 Pain in right leg: Secondary | ICD-10-CM | POA: Diagnosis not present

## 2022-05-13 DIAGNOSIS — R509 Fever, unspecified: Secondary | ICD-10-CM | POA: Diagnosis not present

## 2022-05-13 DIAGNOSIS — M25561 Pain in right knee: Secondary | ICD-10-CM | POA: Diagnosis present

## 2022-05-13 DIAGNOSIS — M791 Myalgia, unspecified site: Secondary | ICD-10-CM | POA: Insufficient documentation

## 2022-05-13 LAB — BASIC METABOLIC PANEL
Anion gap: 6 (ref 5–15)
BUN: 20 mg/dL (ref 6–20)
CO2: 22 mmol/L (ref 22–32)
Calcium: 8.9 mg/dL (ref 8.9–10.3)
Chloride: 113 mmol/L — ABNORMAL HIGH (ref 98–111)
Creatinine, Ser: 0.85 mg/dL (ref 0.44–1.00)
GFR, Estimated: >60 mL/min (ref >60)
Glucose, Bld: 127 mg/dL — ABNORMAL HIGH (ref 70–99)
Potassium: 3.5 mmol/L (ref 3.5–5.1)
Sodium: 141 mmol/L (ref 135–145)

## 2022-05-13 LAB — CBC
HCT: 41.9 % (ref 36.0–46.0)
Hemoglobin: 13.9 g/dL (ref 12.0–15.0)
MCH: 29.6 pg (ref 26.0–34.0)
MCHC: 33.2 g/dL (ref 30.0–36.0)
MCV: 89.1 fL (ref 80.0–100.0)
Platelets: 286 10*3/uL (ref 150–400)
RBC: 4.7 MIL/uL (ref 3.87–5.11)
RDW: 13.5 % (ref 11.5–15.5)
WBC: 9.1 10*3/uL (ref 4.0–10.5)
nRBC: 0 % (ref 0.0–0.2)

## 2022-05-13 MED ORDER — NAPROXEN 375 MG PO TABS: 375.0000 mg | ORAL_TABLET | Freq: Two times a day (BID) | ORAL | 0 refills | Status: DC

## 2022-05-13 NOTE — ED Triage Notes (Addendum)
Pt reports waking up with swelling inner right knee area that is warm to touch. Reports fever yesterday.   Pt went to ortho this morning and xrays were negative.   Pt worried for blood clot in right leg.   A/Ox4 Ambulatory in triage    Denies blood thinners

## 2022-05-13 NOTE — Progress Notes (Signed)
Right lower extremity venous duplex has been completed. Preliminary results can be found in CV Proc through chart review.  Results were given to Genevive Bi PA.  05/13/22 3:32 PM Patricia Erickson RVT

## 2022-05-13 NOTE — ED Provider Notes (Signed)
[facility] DEPT Provider Note   CSN: [id number] Arrival date & time: 05/13/22  1446     History  Chief Complaint  Patient presents with   Knee Pain    Patricia Erickson is a 51 y.o. female with medical history of COVID, kidney stones, migraines.  The patient presents ED for evaluation of right knee pain.  Patient states that yesterday she began to feel malaised, had low-grade fever at home and body aches.  Patient reports that this morning upon waking her right knee was bigger than her left, swollen.  Patient states the pain in her knee is rated at 4 out of 10.  Patient is concerned about blood clots.  Patient denies any recent trauma to account for knee pain.  Patient denies nausea, vomiting, history of blood clots.  Patient denies chest pain, shortness of breath.   Knee Pain Associated symptoms: fever       Home Medications Prior to Admission medications   Medication Sig Start Date End Date Taking? Authorizing Provider  naproxen (NAPROSYN) 375 MG tablet Take 1 tablet (375 mg total) by mouth 2 (two) times daily. 05/13/22  Yes [name], PA-C  Bacillus Coagulans-Inulin (PROBIOTIC-PREBIOTIC PO) Take 3.6 g by mouth daily.    [provider]  baclofen (LIORESAL) 10 MG tablet Take 10 mg by mouth 2 (two) times daily as needed (Migraine).  07/21/19   [provider]  chlorproMAZINE (THORAZINE) 25 MG tablet Take 25 mg by mouth 4 (four) times daily as needed (migraine).  07/11/19   [provider]  topiramate (TOPAMAX) 200 MG tablet Take 300 mg by mouth at bedtime.     [provider]  Ubrogepant (UBRELVY) 100 MG TABS Take 100 mg by mouth once as needed for up to 1 dose (headache). 04/02/22   Marge Duncans, PA-C  prochlorperazine (COMPAZINE) 10 MG tablet Take 1 tablet (10 mg total) by mouth 2 (two) times daily as needed for nausea or vomiting. 07/16/18 08/26/19  Tegeler, Gwenyth Allegra, MD      Allergies    Other and  Propoxyphene n-acetaminophen    Review of Systems   Review of Systems  Constitutional:  Positive for chills and fever.  Respiratory:  Negative for shortness of breath.   Cardiovascular:  Negative for chest pain.  Gastrointestinal:  Negative for nausea and vomiting.  Musculoskeletal:  Positive for arthralgias and joint swelling.  All other systems reviewed and are negative.  Physical Exam Updated Vital Signs BP (!) 134/104   Pulse 82   Temp 98.3 F (36.8 C) (Oral)   SpO2 99%  Physical Exam Vitals and nursing note reviewed.  Constitutional:      General: She is not in acute distress.    Appearance: Normal appearance. She is not ill-appearing, toxic-appearing or diaphoretic.  HENT:     Head: Atraumatic.     Nose: Nose normal.     Mouth/Throat:     Mouth: Mucous membranes are moist.     Pharynx: Oropharynx is clear.  Eyes:     Extraocular Movements: Extraocular movements intact.     Conjunctiva/sclera: Conjunctivae normal.     Pupils: Pupils are equal, round, and reactive to light.  Cardiovascular:     Rate and Rhythm: Normal rate and regular rhythm.     Pulses:          Dorsalis pedis pulses are 2+ on the right side.  Pulmonary:     Effort: Pulmonary effort is normal. No respiratory  distress.     Breath sounds: Normal breath sounds. No wheezing.  Abdominal:     General: Abdomen is flat. Bowel sounds are normal.     Palpations: Abdomen is soft.     Tenderness: There is no abdominal tenderness.  Musculoskeletal:     Cervical back: Normal range of motion and neck supple.     Right knee: Swelling present. No deformity, effusion, erythema, ecchymosis or lacerations. Decreased range of motion. No tenderness.     Left knee: Normal.  Skin:    General: Skin is warm and dry.     Capillary Refill: Capillary refill takes less than 2 seconds.  Neurological:     Mental Status: She is alert and oriented to person, place, and time.    ED Results / Procedures / Treatments    Labs (all labs ordered are listed, but only abnormal results are displayed) Labs Reviewed  BASIC METABOLIC PANEL - Abnormal; Notable for the following components:      Result Value   Chloride 113 (*)    Glucose, Bld 127 (*)    All other components within normal limits  CBC    EKG None  Radiology VAS [place] LOWER EXTREMITY VENOUS (DVT) (7a-7p)  Result Date: 05/13/2022  Lower Venous DVT Study Patient Name:  NEZZIE MANERA  Date of Exam:   05/13/2022 Medical Rec #: [id number]          Accession #:    [id number] Date of Birth: [birth year]          Patient Gender: F Patient Age:   50 years Exam Location:  [facility] Procedure:      VAS [place] LOWER EXTREMITY VENOUS (DVT) Referring Phys: Genevive Bi --------------------------------------------------------------------------------  Indications: Pain.  Risk Factors: None identified. Comparison Study: No prior studies. Performing Technologist: Oliver Hum RVT  Examination Guidelines: A complete evaluation includes B-mode imaging, spectral Doppler, color Doppler, and power Doppler as needed of all accessible portions of each vessel. Bilateral testing is considered an integral part of a complete examination. Limited examinations for reoccurring indications may be performed as noted. The reflux portion of the exam is performed with the patient in reverse Trendelenburg.  +---------+---------------+---------+-----------+----------+--------------+ RIGHT    CompressibilityPhasicitySpontaneityPropertiesThrombus Aging +---------+---------------+---------+-----------+----------+--------------+ CFV      Full           Yes      Yes                                 +---------+---------------+---------+-----------+----------+--------------+ SFJ      Full                                                        +---------+---------------+---------+-----------+----------+--------------+ FV Prox  Full                                                         +---------+---------------+---------+-----------+----------+--------------+ FV Mid   Full                                                        +---------+---------------+---------+-----------+----------+--------------+  FV DistalFull                                                        +---------+---------------+---------+-----------+----------+--------------+ PFV      Full                                                        +---------+---------------+---------+-----------+----------+--------------+ POP      Full           Yes      Yes                                 +---------+---------------+---------+-----------+----------+--------------+ PTV      Full                                                        +---------+---------------+---------+-----------+----------+--------------+ PERO     Full                                                        +---------+---------------+---------+-----------+----------+--------------+   +----+---------------+---------+-----------+----------+--------------+ LEFTCompressibilityPhasicitySpontaneityPropertiesThrombus Aging +----+---------------+---------+-----------+----------+--------------+ CFV Full           Yes      Yes                                 +----+---------------+---------+-----------+----------+--------------+     Summary: RIGHT: - There is no evidence of deep vein thrombosis in the lower extremity.  - No cystic structure found in the popliteal fossa.  LEFT: - No evidence of common femoral vein obstruction.  *See table(s) above for measurements and observations.    Preliminary     Procedures Procedures    Medications Ordered in ED Medications - No data to display  ED Course/ Medical Decision Making/ A&P                           Medical Decision Making Amount and/or Complexity of Data Reviewed Labs: ordered.  Risk Prescription drug management.   51 year old female  presents ED for evaluation of right knee pain.  Please see HPI for further details.  On examination, the patient's right knee appears to have soft tissue swelling located medially.  There is no overlying skin change.  The joint is not hot to touch.  The patient has a 2+ DP pulse in her right foot.  The patient's range of motion is slightly decreased however she still shows the ability to actively range her knee.  Patient worked up utilizing the following labs and imaging studies interpreted by me personally: - CBC unremarkable, no elevated WBC indicative of infection - BMP unremarkable - Lower extremity ultrasound shows no signs of DVT  Patient  discharged home with prescription for naproxen.  Patient advised to follow-up with her PCP for ongoing information.  Patient counseled on return precautions and voiced understanding.  Patient encouraged to utilize RICE method to care for her joint at home.  The patient has had all her questions answered to her satisfaction.  The patient is stable for discharge.   Final Clinical Impression(s) / ED Diagnoses Final diagnoses:  Acute pain of right knee    Rx / DC Orders ED Discharge Orders          Ordered    naproxen (NAPROSYN) 375 MG tablet  2 times daily        05/13/22 1552              [name], PA-C 05/13/22 1756    Varney Biles, MD 05/14/22 1606

## 2022-05-13 NOTE — ED Provider Triage Note (Signed)
Emergency Medicine Provider Triage Evaluation Note  Patricia Erickson , a 51 y.o. female  was evaluated in triage.  Pt complains of swelling of knee on right lower extremity.  Patient states that yesterday she began to feel malaised, had low-grade fever at home.  Patient reports this morning upon waking she has swelling to the medial side of her right knee.  There is no overlying skin change.  Patient states that it is "achy".  Patient states that she was seen at orthopedics this morning and advised that she did not "have any arthritis" in her knee.  The patient is unable to tell me whether or not they provided any more detail.  Patient denies history of blood clots.  Patient not anticoagulated.  Patient denies any recent travel.  Review of Systems  Positive:  Negative:   Physical Exam  BP (!) 134/104   Pulse 82   Temp 98.3 F (36.8 C) (Oral)   SpO2 99%  Gen:   Awake, no distress   Resp:  Normal effort  MSK:   Moves extremities without difficulty  Other:  Medial side of right knee swollen, edematous.  No overlying erythema.  2+ DP pulse in the right foot.  Medical Decision Making  Medically screening exam initiated at 3:09 PM.  Appropriate orders placed.  [name] was informed that the remainder of the evaluation will be completed by another provider, this initial triage assessment does not replace that evaluation, and the importance of remaining in the ED until their evaluation is complete.     [name], PA-C 05/13/22 1511

## 2022-05-13 NOTE — Discharge Instructions (Addendum)
Please return to the ED with any new symptoms such as shortness of breath, chest pain, fevers, increased left knee pain Please follow up with PCP as discussed Please read attached informational guides concerning knee pain Please pick up prescription medications I have sent in for you Please utilize RICE treatment method. Rest, Ice, Compression, Elevation

## 2022-05-14 ENCOUNTER — Telehealth: Payer: Self-pay | Admitting: Physician Assistant

## 2022-05-14 ENCOUNTER — Encounter: Payer: 59 | Admitting: Physician Assistant

## 2022-05-14 NOTE — Telephone Encounter (Signed)
Pt called into the main office today requesting to move her cpe fasting appt to a different day this week due to a unscheduled management meeting at her work. I told the pt the next opening for cpe with sally would be July 29 she was clearly upset as there was a lot of huff and puffing over the phone. She stated that was not going to work and that she has had this appt schedule since may and we as the office cancel it due to your office having scheduling issues..she requested to see a different provider because she was having health issues and went to hospital yesterday. I addressed to her that the appt may be changed to acute/hospital follow up instead of a CPE due to the health concerns. The pt kept saying she was confused and it didn't make since that she couldn't have a phy. She said that it needs to stay as a CPE and she was not liking how our office was handling her appts. Her appt is tomorrow with shannon at 8:00

## 2022-05-15 ENCOUNTER — Encounter: Payer: Self-pay | Admitting: Nurse Practitioner

## 2022-05-15 ENCOUNTER — Other Ambulatory Visit: Payer: Self-pay | Admitting: Physician Assistant

## 2022-05-15 ENCOUNTER — Encounter: Payer: 59 | Admitting: Physician Assistant

## 2022-05-15 ENCOUNTER — Ambulatory Visit (INDEPENDENT_AMBULATORY_CARE_PROVIDER_SITE_OTHER): Payer: 59 | Admitting: Nurse Practitioner

## 2022-05-15 VITALS — BP 112/80 | HR 75 | Temp 97.3°F | Ht 66.0 in | Wt 197.0 lb

## 2022-05-15 DIAGNOSIS — Z6831 Body mass index (BMI) 31.0-31.9, adult: Secondary | ICD-10-CM

## 2022-05-15 DIAGNOSIS — Z0000 Encounter for general adult medical examination without abnormal findings: Secondary | ICD-10-CM

## 2022-05-15 DIAGNOSIS — Z124 Encounter for screening for malignant neoplasm of cervix: Secondary | ICD-10-CM

## 2022-05-15 DIAGNOSIS — Z1231 Encounter for screening mammogram for malignant neoplasm of breast: Secondary | ICD-10-CM

## 2022-05-15 NOTE — Progress Notes (Signed)
Subjective:  Patient ID: Patricia Erickson, female    DOB: [birth year]  Age: 51 y.o. MRN: [id number]  Chief Complaint  Patient presents with   Annual Exam    HPI Encounter for general adult medical examination without abnormal findings  Physical ("At Risk" items are starred): Patient's last physical exam was 1 year ago.      SDOH Screenings   Alcohol Screen: Low Risk    Last Alcohol Screening Score (AUDIT): 0  Depression (PHQ2-9): Low Risk    PHQ-2 Score: 0  Financial Resource Strain: Low Risk    Difficulty of Paying Living Expenses: Not hard at all  Food Insecurity: No Food Insecurity   Worried About Charity fundraiser in the Last Year: Never true   Ran Out of Food in the Last Year: Never true  Housing: Low Risk    Last Housing Risk Score: 0  Physical Activity: Inactive   Days of Exercise per Week: 0 days   Minutes of Exercise per Session: 0 min  Social Connections: Moderately Isolated   Frequency of Communication with Friends and Family: More than three times a week   Frequency of Social Gatherings with Friends and Family: More than three times a week   Attends Religious Services: More than 4 times per year   Active Member of Clubs or Organizations: No   Attends Archivist Meetings: Never   Marital Status: Divorced  Stress: No Stress Concern Present   Feeling of Stress : Not at all  Tobacco Use: Medium Risk   Smoking Tobacco Use: Former   Smokeless Tobacco Use: Never   Passive Exposure: Not on file  Transportation Needs: No Transportation Needs   Lack of Transportation (Medical): No   Lack of Transportation (Non-Medical): No       05/15/2022    8:15 AM 04/02/2022    9:48 AM 01/02/2017   10:25 AM 07/05/2015    4:50 PM 05/01/2015    8:39 AM  [place] in the past year? 0 0 No No No  Number falls in past yr: 0 0     Injury with Fall? 0 0     Risk for fall due to : No Fall Risks      Follow up Falls evaluation completed           05/15/2022     8:16 AM 04/02/2022    9:48 AM 01/02/2017   10:25 AM 07/05/2015    4:50 PM 06/08/2015    3:10 PM  Depression screen PHQ 2/9  Decreased Interest 0 0 0 0 0  Down, Depressed, Hopeless 0 0 0 0 0  PHQ - 2 Score 0 0 0 0 0    Functional Status Survey:     Safety: reviewed ;  Patient wears a seat belt. Patient's home has smoke detectors and carbon monoxide detectors. Patient practices appropriate gun safety Patient wears sunscreen with extended sun exposure. Dental Care: biannual cleanings, brushes and flosses daily. Ophthalmology/Optometry: Annual visit.  Hearing loss: none Vision impairments: none Patient is not afflicted from Stress Incontinence and Urge Incontinence   Current Outpatient Medications on File Prior to Visit  Medication Sig Dispense Refill   Bacillus Coagulans-Inulin (PROBIOTIC-PREBIOTIC PO) Take 3.6 g by mouth daily.     baclofen (LIORESAL) 10 MG tablet Take 10 mg by mouth 2 (two) times daily as needed (Migraine).      chlorproMAZINE (THORAZINE) 25 MG tablet Take 25 mg by mouth 4 (four) times  daily as needed (migraine).      naproxen (NAPROSYN) 375 MG tablet Take 1 tablet (375 mg total) by mouth 2 (two) times daily. 20 tablet 0   topiramate (TOPAMAX) 200 MG tablet Take 300 mg by mouth at bedtime.      Ubrogepant (UBRELVY) 100 MG TABS Take 100 mg by mouth once as needed for up to 1 dose (headache). (Patient not taking: Reported on 05/15/2022) 2 tablet 0   [DISCONTINUED] prochlorperazine (COMPAZINE) 10 MG tablet Take 1 tablet (10 mg total) by mouth 2 (two) times daily as needed for nausea or vomiting. 10 tablet 0   No current facility-administered medications on file prior to visit.    Social Hx   Social History   Socioeconomic History   Marital status: Divorced    Spouse name: Not on file   Number of children: Not on file   Years of education: 14   Highest education level: Not on file  Occupational History   Not on file  Tobacco Use   Smoking status: Former     Packs/day: 0.50    Years: 7.00    Pack years: 3.50    Types: Cigarettes    Quit date: 10/12/1992    Years since quitting: 29.6   Smokeless tobacco: Never  Vaping Use   Vaping Use: Never used  Substance and Sexual Activity   Alcohol use: Not Currently    Alcohol/week: 0.0 standard drinks   Drug use: No   Sexual activity: Not Currently    Birth control/protection: I.U.D.    Comment: Mirena 10/29/2012  Other Topics Concern   Not on file  Social History Narrative   Not on file   Social Determinants of Health   Financial Resource Strain: Low Risk    Difficulty of Paying Living Expenses: Not hard at all  Food Insecurity: No Food Insecurity   Worried About Charity fundraiser in the Last Year: Never true   [place] in the Last Year: Never true  Transportation Needs: No Transportation Needs   Lack of Transportation (Medical): No   Lack of Transportation (Non-Medical): No  Physical Activity: Inactive   Days of Exercise per Week: 0 days   Minutes of Exercise per Session: 0 min  Stress: No Stress Concern Present   Feeling of Stress : Not at all  Social Connections: Moderately Isolated   Frequency of Communication with Friends and Family: More than three times a week   Frequency of Social Gatherings with Friends and Family: More than three times a week   Attends Religious Services: More than 4 times per year   Active Member of [employer] or Organizations: No   Attends Archivist Meetings: Never   Marital Status: Divorced   Past Medical History:  Diagnosis Date   COVID    History of kidney stones    Migraines    hz of migraines    Polyp of colon 03/11/2007   BENIGN   Family History  Problem Relation Age of Onset   Osteoporosis Mother    Breast cancer Mother 33   Colon cancer Mother 90       removed large intestine   Lymphoma Mother 68   Hypertension Father    Osteoporosis Maternal Grandmother    Lymphoma Maternal Grandmother 26   Colon polyps Sister         "several" dx. 35 or younger   Other Sister        hysterectomy at 3; suspicious  breast finding removed at 25   Colon cancer Maternal Aunt 43   Lymphoma Maternal Uncle 63   Lung cancer Maternal Grandfather 66       metastatic to bone; former smoker   Throat cancer Paternal Grandmother 59       not a smoker   Heart Problems Paternal Grandfather    Other Other        suspicious breast finding at 11 - recent follow-up normal   Heart Problems Maternal Uncle    Bladder Cancer Paternal Uncle 67   Throat cancer Paternal Uncle        dx. before age 43    Review of Systems  Constitutional:  Negative for chills, fatigue and fever.  HENT:  Negative for congestion, ear pain, rhinorrhea and sore throat.   Respiratory:  Negative for cough and shortness of breath.   Cardiovascular:  Negative for chest pain.  Gastrointestinal:  Negative for abdominal pain, constipation, diarrhea, nausea and vomiting.  Genitourinary:  Negative for dysuria and urgency.  Musculoskeletal:  Positive for back pain (chronic sciatica). Negative for myalgias.  Skin:  Positive for rash (left forearm).  Neurological:  Negative for dizziness, weakness, light-headedness and headaches.  Psychiatric/Behavioral:  Negative for dysphoric mood. The patient is not nervous/anxious.     Objective:  BP 112/80   Pulse 75   Temp (!) 97.3 F (36.3 C)   Ht '5\' 6"'$  (1.676 m)   Wt 197 lb (89.4 kg)   SpO2 99%   BMI 31.80 kg/m      05/15/2022    8:15 AM 05/13/2022    2:53 PM 04/02/2022    9:48 AM  BP/Weight  Systolic BP 956 213 086  Diastolic BP 80 578 72  Wt. (Lbs) 197  193.6  BMI 31.8 kg/m2  31.25 kg/m2    Physical Exam Vitals reviewed. Exam conducted with a chaperone present.  Constitutional:      Appearance: Normal appearance.  HENT:     Head: Normocephalic.     Right Ear: Tympanic membrane normal.     Left Ear: Tympanic membrane normal.     Nose: Nose normal.     Mouth/Throat:     Mouth: Mucous membranes are moist.   Eyes:     Pupils: Pupils are equal, round, and reactive to light.  Cardiovascular:     Rate and Rhythm: Normal rate and regular rhythm.     Pulses: Normal pulses.     Heart sounds: Normal heart sounds.  Pulmonary:     Effort: Pulmonary effort is normal.     Breath sounds: Normal breath sounds.  Abdominal:     General: Bowel sounds are normal.     Palpations: Abdomen is soft.  Genitourinary:    Exam position: Lithotomy position.     Vagina: No vaginal discharge.  Musculoskeletal:        General: Normal range of motion.     Cervical back: Neck supple.  Skin:    General: Skin is warm and dry.     Capillary Refill: Capillary refill takes less than 2 seconds.     Findings: Rash (left forearm) present. Rash is vesicular.          Comments: Left forearm vesicular rash present  Neurological:     General: No focal deficit present.     Mental Status: She is alert and oriented to person, place, and time.  Psychiatric:        Mood and Affect: Mood normal.  Behavior: Behavior normal.    Lab Results  Component Value Date   WBC 9.1 05/13/2022   HGB 13.9 05/13/2022   HCT 41.9 05/13/2022   PLT 286 05/13/2022   GLUCOSE 127 (H) 05/13/2022   CHOL 170 11/18/2016   TRIG 70 11/18/2016   HDL 54 11/18/2016   LDLCALC 102 (H) 11/18/2016   ALT 30 12/06/2019   AST 21 12/06/2019   NA 141 05/13/2022   K 3.5 05/13/2022   CL 113 (H) 05/13/2022   CREATININE 0.85 05/13/2022   BUN 20 05/13/2022   CO2 22 05/13/2022   TSH 0.72 11/21/2017   HGBA1C 5.5 11/21/2017      Assessment & Plan:    1. Routine medical exam - Comprehensive metabolic panel - Lipid panel - TSH  2. Encounter for screening for malignant neoplasm of cervix - IGP, Aptima HPV, rfx 16/18,45  3. BMI 31.0-31.9,adult - Comprehensive metabolic panel - Lipid panel - TSH     We will call you with lab results Call breast center for mammogram Follow-up in 1 year for physical exam    These are the goals we  discussed:  Goals   Heart healthy diet      This is a list of the screening recommended for you and due dates:  Health Maintenance  Topic Date Due   Pap Smear  12/03/2020   Zoster (Shingles) Vaccine (1 of 2) 03/31/2023*   Flu Shot  07/09/2022   Mammogram  05/17/2023   Tetanus Vaccine  11/09/2024   Colon Cancer Screening  09/27/2025   Hepatitis C Screening: USPSTF Recommendation to screen - Ages 18-79 yo.  Completed   HIV Screening  Completed   HPV Vaccine  Aged Out   COVID-19 Vaccine  Discontinued  *Topic was postponed. The date shown is not the original due date.      AN INDIVIDUALIZED CARE PLAN: was established or reinforced today.   SELF MANAGEMENT: The patient and I together assessed ways to personally work towards obtaining the recommended goals  Support needs The patient and/or family needs were assessed and services were offered and not necessary at this time.    Follow-up: 1 year CPE  I, Rip Harbour, NP, have reviewed all documentation for this visit. The documentation on 05/15/22 for the exam, diagnosis, procedures, and orders are all accurate and complete.   Signed, Jerrell Belfast, [place] [phone]

## 2022-05-15 NOTE — Patient Instructions (Addendum)
We will call you with lab results Call breast center for mammogram Follow-up in 1 year for physical exam   Health Maintenance, Female Adopting a healthy lifestyle and getting preventive care are important in promoting health and wellness. Ask your health care provider about: The right schedule for you to have regular tests and exams. Things you can do on your own to prevent diseases and keep yourself healthy. What should I know about diet, weight, and exercise? Eat a healthy diet  Eat a diet that includes plenty of vegetables, fruits, low-fat dairy products, and lean protein. Do not eat a lot of foods that are high in solid fats, added sugars, or sodium. Maintain a healthy weight Body mass index (BMI) is used to identify weight problems. It estimates body fat based on height and weight. Your health care provider can help determine your BMI and help you achieve or maintain a healthy weight. Get regular exercise Get regular exercise. This is one of the most important things you can do for your health. Most adults should: Exercise for at least 150 minutes each week. The exercise should increase your heart rate and make you sweat (moderate-intensity exercise). Do strengthening exercises at least twice a week. This is in addition to the moderate-intensity exercise. Spend less time sitting. Even light physical activity can be beneficial. Watch cholesterol and blood lipids Have your blood tested for lipids and cholesterol at 53 years of age, then have this test every 5 years. Have your cholesterol levels checked more often if: Your lipid or cholesterol levels are high. You are older than 53 years of age. You are at high risk for heart disease. What should I know about cancer screening? Depending on your health history and family history, you may need to have cancer screening at various ages. This may include screening for: Breast cancer. Cervical cancer. Colorectal cancer. Skin cancer. Lung  cancer. What should I know about heart disease, diabetes, and high blood pressure? Blood pressure and heart disease High blood pressure causes heart disease and increases the risk of stroke. This is more likely to develop in people who have high blood pressure readings or are overweight. Have your blood pressure checked: Every 3-5 years if you are 79-82 years of age. Every year if you are 34 years old or older. Diabetes Have regular diabetes screenings. This checks your fasting blood sugar level. Have the screening done: Once every three years after age 6 if you are at a normal weight and have a low risk for diabetes. More often and at a younger age if you are overweight or have a high risk for diabetes. What should I know about preventing infection? Hepatitis B If you have a higher risk for hepatitis B, you should be screened for this virus. Talk with your health care provider to find out if you are at risk for hepatitis B infection. Hepatitis C Testing is recommended for: Everyone born from [birth year] through 1967. Anyone with known risk factors for hepatitis C. Sexually transmitted infections (STIs) Get screened for STIs, including gonorrhea and chlamydia, if: You are sexually active and are younger than 53 years of age. You are older than 53 years of age and your health care provider tells you that you are at risk for this type of infection. Your sexual activity has changed since you were last screened, and you are at increased risk for chlamydia or gonorrhea. Ask your health care provider if you are at risk. Ask your health care provider about  whether you are at high risk for HIV. Your health care provider may recommend a prescription medicine to help prevent HIV infection. If you choose to take medicine to prevent HIV, you should first get tested for HIV. You should then be tested every 3 months for as long as you are taking the medicine. Pregnancy If you are about to stop having your  period (premenopausal) and you may become pregnant, seek counseling before you get pregnant. Take 400 to 800 micrograms (mcg) of folic acid every day if you become pregnant. Ask for birth control (contraception) if you want to prevent pregnancy. Osteoporosis and menopause Osteoporosis is a disease in which the bones lose minerals and strength with aging. This can result in bone fractures. If you are 38 years old or older, or if you are at risk for osteoporosis and fractures, ask your health care provider if you should: Be screened for bone loss. Take a calcium or vitamin D supplement to lower your risk of fractures. Be given hormone replacement therapy (HRT) to treat symptoms of menopause. Follow these instructions at home: Alcohol use Do not drink alcohol if: Your health care provider tells you not to drink. You are pregnant, may be pregnant, or are planning to become pregnant. If you drink alcohol: Limit how much you have to: 0-1 drink a day. Know how much alcohol is in your drink. In the [place] one drink equals one 12 oz bottle of beer (355 mL), one 5 oz glass of wine (148 mL), or one 1 oz glass of hard liquor (44 mL). Lifestyle Do not use any products that contain nicotine or tobacco. These products include cigarettes, chewing tobacco, and vaping devices, such as e-cigarettes. If you need help quitting, ask your health care provider. Do not use street drugs. Do not share needles. Ask your health care provider for help if you need support or information about quitting drugs. General instructions Schedule regular health, dental, and eye exams. Stay current with your vaccines. Tell your health care provider if: You often feel depressed. You have ever been abused or do not feel safe at home. Summary Adopting a healthy lifestyle and getting preventive care are important in promoting health and wellness. Follow your health care provider's instructions about healthy diet, exercising, and  getting tested or screened for diseases. Follow your health care provider's instructions on monitoring your cholesterol and blood pressure. This information is not intended to replace advice given to you by your health care provider. Make sure you discuss any questions you have with your health care provider. Document Revised: 04/16/2021 Document Reviewed: 04/16/2021 Elsevier Patient Education  [place].

## 2022-05-16 LAB — COMPREHENSIVE METABOLIC PANEL
ALT: 22 IU/L (ref 0–32)
AST: 19 IU/L (ref 0–40)
Albumin/Globulin Ratio: 2.3 — ABNORMAL HIGH (ref 1.2–2.2)
Albumin: 4.6 g/dL (ref 3.8–4.9)
Alkaline Phosphatase: 89 IU/L (ref 44–121)
BUN/Creatinine Ratio: 21 (ref 9–23)
BUN: 18 mg/dL (ref 6–24)
Bilirubin Total: 0.2 mg/dL (ref 0.0–1.2)
CO2: 21 mmol/L (ref 20–29)
Calcium: 9.5 mg/dL (ref 8.7–10.2)
Chloride: 109 mmol/L — ABNORMAL HIGH (ref 96–106)
Creatinine, Ser: 0.87 mg/dL (ref 0.57–1.00)
Globulin, Total: 2 g/dL (ref 1.5–4.5)
Glucose: 106 mg/dL — ABNORMAL HIGH (ref 70–99)
Potassium: 4.7 mmol/L (ref 3.5–5.2)
Sodium: 142 mmol/L (ref 134–144)
Total Protein: 6.6 g/dL (ref 6.0–8.5)
eGFR: 80 mL/min/{1.73_m2} (ref >59)

## 2022-05-16 LAB — CARDIOVASCULAR RISK ASSESSMENT

## 2022-05-16 LAB — LIPID PANEL
Chol/HDL Ratio: 3.9 ratio (ref 0.0–4.4)
Cholesterol, Total: 182 mg/dL (ref 100–199)
HDL: 47 mg/dL (ref >39)
LDL Chol Calc (NIH): 121 mg/dL — ABNORMAL HIGH (ref 0–99)
Triglycerides: 74 mg/dL (ref 0–149)
VLDL Cholesterol Cal: 14 mg/dL (ref 5–40)

## 2022-05-16 LAB — TSH: TSH: 0.923 u[IU]/mL (ref 0.450–4.500)

## 2022-05-21 LAB — IGP, APTIMA HPV, RFX 16/18,45
HPV Aptima: NEGATIVE
PAP Smear Comment: 0

## 2022-05-23 ENCOUNTER — Ambulatory Visit
Admission: RE | Admit: 2022-05-23 | Discharge: 2022-05-23 | Disposition: A | Discharge status: Home / Self Care | Payer: 59 | Source: Ambulatory Visit | Attending: Physician Assistant | Admitting: Physician Assistant

## 2022-05-23 DIAGNOSIS — Z1231 Encounter for screening mammogram for malignant neoplasm of breast: Secondary | ICD-10-CM

## 2022-06-25 ENCOUNTER — Ambulatory Visit: Payer: 59 | Admitting: Physician Assistant

## 2022-06-25 ENCOUNTER — Encounter: Payer: Self-pay | Admitting: Physician Assistant

## 2022-06-25 VITALS — BP 118/80 | HR 84 | Temp 97.4°F | Ht 66.0 in | Wt 191.2 lb

## 2022-06-25 DIAGNOSIS — N3001 Acute cystitis with hematuria: Secondary | ICD-10-CM

## 2022-06-25 LAB — POCT URINALYSIS DIP (CLINITEK)
Bilirubin, UA: NEGATIVE
Glucose, UA: NEGATIVE mg/dL
Ketones, POC UA: NEGATIVE mg/dL
Nitrite, UA: NEGATIVE
Spec Grav, UA: 1.015 (ref 1.010–1.025)
Urobilinogen, UA: 0.2 E.U./dL
pH, UA: 6 (ref 5.0–8.0)

## 2022-06-25 MED ORDER — PHENAZOPYRIDINE HCL 200 MG PO TABS: 200.0000 mg | ORAL_TABLET | Freq: Three times a day (TID) | ORAL | 0 refills | Status: DC | PRN

## 2022-06-25 MED ORDER — NITROFURANTOIN MONOHYD MACRO 100 MG PO CAPS: 100.0000 mg | ORAL_CAPSULE | Freq: Two times a day (BID) | ORAL | 0 refills | Status: DC

## 2022-06-25 NOTE — Progress Notes (Signed)
Acute Office Visit  Subjective:    Patient ID: Patricia Erickson, female    DOB: Apr 28, 1971, 51 y.o.   MRN: [id number]  Chief Complaint  Patient presents with   Urinary Tract Infection    HPI: Patient is in today for complaints of urine frequency, urgency and dysuria that started a few days ago - denies fever, nausea, vomiting, stomach pain Does not have menses due to Mirena placement  Past Medical History:  Diagnosis Date   COVID    History of kidney stones    Migraines    hz of migraines    Polyp of colon 03/11/2007   BENIGN    Past Surgical History:  Procedure Laterality Date   CESAREAN SECTION  2000   CHOLECYSTECTOMY N/A 10/18/2015   Procedure: LAPAROSCOPIC CHOLECYSTECTOMY;  Surgeon: Mickeal Skinner, MD;  Location: [place];  Service: General;  Laterality: N/A;   CYSTOSCOPY/URETEROSCOPY/HOLMIUM LASER/STENT PLACEMENT Left 07/31/2020   Procedure: CYSTOSCOPY/URETEROSCOPY/HOLMIUM LASER/STENT PLACEMENT;  Surgeon: Robley Fries, MD;  Location: [facility];  Service: Urology;  Laterality: Left;  74 MINS   LAPAROSCOPIC CHOLECYSTECTOMY  nov 9 17    Family History  Problem Relation Age of Onset   Osteoporosis Mother    Breast cancer Mother 76   Colon cancer Mother 74       removed large intestine   Lymphoma Mother 21   Hypertension Father    Osteoporosis Maternal Grandmother    Lymphoma Maternal Grandmother 79   Colon polyps Sister        "several" dx. 73 or younger   Other Sister        hysterectomy at 26; suspicious breast finding removed at 4   Colon cancer Maternal Aunt 43   Lymphoma Maternal Uncle 63   Lung cancer Maternal Grandfather 66       metastatic to bone; former smoker   Throat cancer Paternal Grandmother 61       not a smoker   Heart Problems Paternal Grandfather    Other Other        suspicious breast finding at 1 - recent follow-up normal   Heart Problems Maternal Uncle    Bladder Cancer Paternal Uncle 68   Throat cancer Paternal Uncle         dx. before age 18    Social History   Socioeconomic History   Marital status: Divorced    Spouse name: Not on file   Number of children: Not on file   Years of education: 14   Highest education level: Not on file  Occupational History   Not on file  Tobacco Use   Smoking status: Former    Packs/day: 0.50    Years: 7.00    Total pack years: 3.50    Types: Cigarettes    Quit date: 10/12/1992    Years since quitting: 29.7   Smokeless tobacco: Never  Vaping Use   Vaping Use: Never used  Substance and Sexual Activity   Alcohol use: Not Currently    Alcohol/week: 0.0 standard drinks of alcohol   Drug use: No   Sexual activity: Not Currently    Birth control/protection: I.U.D.    Comment: Mirena 10/29/2012  Other Topics Concern   Not on file  Social History Narrative   Not on file   Social Determinants of Health   Financial Resource Strain: Low Risk  (05/15/2022)   Overall Financial Resource Strain (CARDIA)    Difficulty of Paying Living Expenses: Not hard at all  Food Insecurity: No Food Insecurity (05/15/2022)   Hunger Vital Sign    Worried About Running Out of Food in the Last Year: Never true    Ran Out of Food in the Last Year: Never true  Transportation Needs: No Transportation Needs (05/15/2022)   PRAPARE - Transportation    Lack of Transportation (Medical): No    Lack of Transportation (Non-Medical): No  Physical Activity: Inactive (05/15/2022)   Exercise Vital Sign    Days of Exercise per Week: 0 days    Minutes of Exercise per Session: 0 min  Stress: No Stress Concern Present (05/15/2022)   [employer] - Occupational Stress Questionnaire    Feeling of Stress : Not at all  Social Connections: Moderately Isolated (05/15/2022)   Social Connection and Isolation Panel [NHANES]    Frequency of Communication with Friends and Family: More than three times a week    Frequency of Social Gatherings with Friends and Family: More than three times a  week    Attends Religious Services: More than 4 times per year    Active Member of Clubs or Organizations: No    Attends Club or Organization Meetings: Never    Marital Status: Divorced  Intimate Partner Violence: Not At Risk (05/15/2022)   Humiliation, Afraid, Rape, and Kick questionnaire    Fear of Current or Ex-Partner: No    Emotionally Abused: No    Physically Abused: No    Sexually Abused: No    Outpatient Medications Prior to Visit  Medication Sig Dispense Refill   Bacillus Coagulans-Inulin (PROBIOTIC-PREBIOTIC PO) Take 3.6 g by mouth daily.     baclofen (LIORESAL) 10 MG tablet Take 10 mg by mouth 2 (two) times daily as needed (Migraine).      chlorproMAZINE (THORAZINE) 25 MG tablet Take 25 mg by mouth 4 (four) times daily as needed (migraine).      levonorgestrel (MIRENA, 52 MG,) 20 MCG/DAY IUD Mirena 21 mcg/24 hours (8 yrs) 52 mg intrauterine device  Take by intrauterine route.     meloxicam (MOBIC) 15 MG tablet Take 15 mg by mouth 3 (three) times daily.     naproxen (NAPROSYN) 375 MG tablet Take 1 tablet (375 mg total) by mouth 2 (two) times daily. 20 tablet 0   topiramate (TOPAMAX) 200 MG tablet Take 300 mg by mouth at bedtime.      Ubrogepant (UBRELVY) 100 MG TABS Take 100 mg by mouth once as needed for up to 1 dose (headache). (Patient not taking: Reported on 05/15/2022) 2 tablet 0   No facility-administered medications prior to visit.    Allergies  Allergen Reactions   Other Other (See Comments)   Propoxyphene N-Acetaminophen Nausea And Vomiting    Review of Systems CONSTITUTIONAL: Negative for chills, fatigue, fever,  CARDIOVASCULAR: Negative for chest pain,  RESPIRATORY: Negative for recent cough and dyspnea.  GASTROINTESTINAL: Negative for abdominal pain, acid reflux symptoms, constipation, diarrhea, nausea and vomiting.  GU - see HPI     Objective:  PHYSICAL EXAM:   VS: BP 118/80 (BP Location: Left Arm, Patient Position: Sitting, Cuff Size: Normal)   Pulse  84   Temp (!) 97.4 F (36.3 C) (Temporal)   Ht 5' 6" (1.676 m)   Wt 191 lb 3.2 oz (86.7 kg)   SpO2 95%   BMI 30.86 kg/m   GEN: Well nourished, well developed, in no acute distress   Cardiac: RRR; no murmurs,  Respiratory:  normal respiratory rate and pattern with no distress -   normal breath sounds with no rales, rhonchi, wheezes or rubs GI: normal bowel sounds, no masses or tenderness  Office Visit on 06/25/2022  Component Date Value Ref Range Status   Color, UA 06/25/2022 yellow  yellow Final   Clarity, UA 06/25/2022 cloudy (A)  clear Final   Glucose, UA 06/25/2022 negative  negative mg/dL Final   Bilirubin, UA 06/25/2022 negative  negative Final   Ketones, POC UA 06/25/2022 negative  negative mg/dL Final   Spec Grav, UA 06/25/2022 1.015  1.010 - 1.025 Final   Blood, UA 06/25/2022 trace-intact (A)  negative Final   pH, UA 06/25/2022 6.0  5.0 - 8.0 Final   POC PROTEIN,UA 06/25/2022 trace  negative, trace Final   Urobilinogen, UA 06/25/2022 0.2  0.2 or 1.0 E.U./dL Final   Nitrite, UA 06/25/2022 Negative  Negative Final   Leukocytes, UA 06/25/2022 Large (3+) (A)  Negative Final     Lab Results  Component Value Date   TSH 0.923 05/15/2022   Lab Results  Component Value Date   WBC 9.1 05/13/2022   HGB 13.9 05/13/2022   HCT 41.9 05/13/2022   MCV 89.1 05/13/2022   PLT 286 05/13/2022   Lab Results  Component Value Date   NA 142 05/15/2022   K 4.7 05/15/2022   CO2 21 05/15/2022   GLUCOSE 106 (H) 05/15/2022   BUN 18 05/15/2022   CREATININE 0.87 05/15/2022   BILITOT 0.2 05/15/2022   ALKPHOS 89 05/15/2022   AST 19 05/15/2022   ALT 22 05/15/2022   PROT 6.6 05/15/2022   ALBUMIN 4.6 05/15/2022   CALCIUM 9.5 05/15/2022   ANIONGAP 6 05/13/2022   EGFR 80 05/15/2022   Lab Results  Component Value Date   CHOL 182 05/15/2022   Lab Results  Component Value Date   HDL 47 05/15/2022   Lab Results  Component Value Date   LDLCALC 121 (H) 05/15/2022   Lab Results   Component Value Date   TRIG 74 05/15/2022   Lab Results  Component Value Date   CHOLHDL 3.9 05/15/2022   Lab Results  Component Value Date   HGBA1C 5.5 11/21/2017       Assessment & Plan:   Problem List Items Addressed This Visit   None Visit Diagnoses     Acute cystitis with hematuria    -  Primary   Relevant Medications   nitrofurantoin, macrocrystal-monohydrate, (MACROBID) 100 MG capsule   phenazopyridine (PYRIDIUM) 200 MG tablet   Other Relevant Orders   POCT URINALYSIS DIP (CLINITEK) (Completed)   Urine Culture      Meds ordered this encounter  Medications   nitrofurantoin, macrocrystal-monohydrate, (MACROBID) 100 MG capsule    Sig: Take 1 capsule (100 mg total) by mouth 2 (two) times daily.    Dispense:  20 capsule    Refill:  0    Order Specific Question:   Supervising Provider    Answer:   COX, KIRSTEN [[id number]]   phenazopyridine (PYRIDIUM) 200 MG tablet    Sig: Take 1 tablet (200 mg total) by mouth 3 (three) times daily as needed for pain.    Dispense:  10 tablet    Refill:  0    Order Specific Question:   Supervising Provider    Answer:   COX, KIRSTEN [[id number]]    Orders Placed This Encounter  Procedures   Urine Culture   POCT URINALYSIS DIP (CLINITEK)     Follow-up: Return if symptoms worsen or fail to improve.  An After Visit Summary   was printed and given to the patient.  SARA R , PA-C [facility] [phone] 

## 2022-06-27 LAB — URINE CULTURE

## 2022-07-09 ENCOUNTER — Other Ambulatory Visit: Payer: Self-pay | Admitting: Physician Assistant

## 2022-07-09 ENCOUNTER — Ambulatory Visit (INDEPENDENT_AMBULATORY_CARE_PROVIDER_SITE_OTHER): Payer: 59

## 2022-07-09 DIAGNOSIS — R8290 Unspecified abnormal findings in urine: Secondary | ICD-10-CM

## 2022-07-09 DIAGNOSIS — N3001 Acute cystitis with hematuria: Secondary | ICD-10-CM

## 2022-07-09 LAB — POCT URINALYSIS DIP (CLINITEK)
Bilirubin, UA: NEGATIVE
Blood, UA: NEGATIVE
Glucose, UA: NEGATIVE mg/dL
Ketones, POC UA: NEGATIVE mg/dL
Nitrite, UA: NEGATIVE
POC PROTEIN,UA: NEGATIVE
Spec Grav, UA: 1.015 (ref 1.010–1.025)
Urobilinogen, UA: 0.2 E.U./dL
pH, UA: 6 (ref 5.0–8.0)

## 2022-07-09 MED ORDER — NITROFURANTOIN MONOHYD MACRO 100 MG PO CAPS: 100.0000 mg | ORAL_CAPSULE | Freq: Two times a day (BID) | ORAL | 0 refills | Status: DC

## 2022-07-09 NOTE — Progress Notes (Signed)
Patient in office for repeat UA. She is unsure if she is still symptomatic. She was having dysuria and frequency over weekend. Still having possible frequency today. Denies dysuria today.   Spoke with Patricia Erickson. Urine culture sent. Provider will refill antibiotic until culture results.   Royce Macadamia, [place] 07/09/22 8:53 AM

## 2022-07-11 ENCOUNTER — Other Ambulatory Visit: Payer: Self-pay

## 2022-07-11 ENCOUNTER — Telehealth: Payer: Self-pay

## 2022-07-11 LAB — URINE CULTURE: Organism ID, Bacteria: NO GROWTH

## 2022-07-11 MED ORDER — FLUCONAZOLE 150 MG PO TABS: 150.0000 mg | ORAL_TABLET | Freq: Once | ORAL | 0 refills | Status: AC

## 2022-07-11 NOTE — Telephone Encounter (Signed)
Patient called stated that she is on her second round of antibiotics and her culture came back clear but she was told to finish antibiotics. She is having some burning when urinating still and a little irritated.  Per shannon diflucan can be sent to pharmacy for patient.  Called patient and left message to see what pharmacy she uses.

## 2022-09-10 ENCOUNTER — Telehealth: Payer: Self-pay

## 2022-09-10 NOTE — Telephone Encounter (Signed)
Patient made aware, she will call neurologist office and request they send [place] notes. Fax number was given to patient.

## 2022-09-10 NOTE — Telephone Encounter (Signed)
Patient called and wanted to know can provider refill her medication that she gets from neurologist. Patient stated she has to pay 90 dollars just to go for an appointment for refills. Stated she has spoke with you about this before. Medications that need to be refill are topiramate 200 mg (Daily), baclofen 10 mg (PRN), chlorpromazine 25 mg (PRN). Please advise

## 2022-11-18 ENCOUNTER — Emergency Department (HOSPITAL_COMMUNITY): Payer: 59

## 2022-11-18 ENCOUNTER — Encounter (HOSPITAL_COMMUNITY): Payer: Self-pay

## 2022-11-18 ENCOUNTER — Emergency Department (HOSPITAL_COMMUNITY)
Admission: EM | Admit: 2022-11-18 | Discharge: 2022-11-18 | Disposition: A | Discharge status: Home / Self Care | Payer: 59 | Attending: Emergency Medicine | Admitting: Emergency Medicine

## 2022-11-18 ENCOUNTER — Other Ambulatory Visit: Payer: Self-pay

## 2022-11-18 DIAGNOSIS — K5792 Diverticulitis of intestine, part unspecified, without perforation or abscess without bleeding: Secondary | ICD-10-CM

## 2022-11-18 DIAGNOSIS — R1032 Left lower quadrant pain: Secondary | ICD-10-CM | POA: Diagnosis present

## 2022-11-18 DIAGNOSIS — D72829 Elevated white blood cell count, unspecified: Secondary | ICD-10-CM | POA: Diagnosis not present

## 2022-11-18 DIAGNOSIS — K5732 Diverticulitis of large intestine without perforation or abscess without bleeding: Secondary | ICD-10-CM | POA: Diagnosis not present

## 2022-11-18 DIAGNOSIS — N200 Calculus of kidney: Secondary | ICD-10-CM

## 2022-11-18 LAB — COMPREHENSIVE METABOLIC PANEL
ALT: 19 U/L (ref 0–44)
AST: 16 U/L (ref 15–41)
Albumin: 3.9 g/dL (ref 3.5–5.0)
Alkaline Phosphatase: 82 U/L (ref 38–126)
Anion gap: 6 (ref 5–15)
BUN: 18 mg/dL (ref 6–20)
CO2: 22 mmol/L (ref 22–32)
Calcium: 8.6 mg/dL — ABNORMAL LOW (ref 8.9–10.3)
Chloride: 114 mmol/L — ABNORMAL HIGH (ref 98–111)
Creatinine, Ser: 0.75 mg/dL (ref 0.44–1.00)
GFR, Estimated: >60 mL/min (ref >60)
Glucose, Bld: 106 mg/dL — ABNORMAL HIGH (ref 70–99)
Potassium: 3.7 mmol/L (ref 3.5–5.1)
Sodium: 142 mmol/L (ref 135–145)
Total Bilirubin: 0.5 mg/dL (ref 0.3–1.2)
Total Protein: 6.9 g/dL (ref 6.5–8.1)

## 2022-11-18 LAB — CBC WITH DIFFERENTIAL/PLATELET
Abs Immature Granulocytes: 0.06 10*3/uL (ref 0.00–0.07)
Basophils Absolute: 0.1 10*3/uL (ref 0.0–0.1)
Basophils Relative: 1 %
Eosinophils Absolute: 0.2 10*3/uL (ref 0.0–0.5)
Eosinophils Relative: 2 %
HCT: 44.6 % (ref 36.0–46.0)
Hemoglobin: 14.2 g/dL (ref 12.0–15.0)
Immature Granulocytes: 1 %
Lymphocytes Relative: 16 %
Lymphs Abs: 1.8 10*3/uL (ref 0.7–4.0)
MCH: 29.6 pg (ref 26.0–34.0)
MCHC: 31.8 g/dL (ref 30.0–36.0)
MCV: 92.9 fL (ref 80.0–100.0)
Monocytes Absolute: 0.9 10*3/uL (ref 0.1–1.0)
Monocytes Relative: 8 %
Neutro Abs: 8.3 10*3/uL — ABNORMAL HIGH (ref 1.7–7.7)
Neutrophils Relative %: 72 %
Platelets: 286 10*3/uL (ref 150–400)
RBC: 4.8 MIL/uL (ref 3.87–5.11)
RDW: 13.4 % (ref 11.5–15.5)
WBC: 11.3 10*3/uL — ABNORMAL HIGH (ref 4.0–10.5)
nRBC: 0 % (ref 0.0–0.2)

## 2022-11-18 LAB — URINALYSIS, ROUTINE W REFLEX MICROSCOPIC
Bilirubin Urine: NEGATIVE
Glucose, UA: NEGATIVE mg/dL
Hgb urine dipstick: NEGATIVE
Ketones, ur: NEGATIVE mg/dL
Leukocytes,Ua: NEGATIVE
Nitrite: NEGATIVE
Protein, ur: NEGATIVE mg/dL
Specific Gravity, Urine: 1.015 (ref 1.005–1.030)
pH: 6 (ref 5.0–8.0)

## 2022-11-18 LAB — I-STAT CHEM 8, ED
BUN: 19 mg/dL (ref 6–20)
Calcium, Ion: 1.24 mmol/L (ref 1.15–1.40)
Chloride: 111 mmol/L (ref 98–111)
Creatinine, Ser: 0.7 mg/dL (ref 0.44–1.00)
Glucose, Bld: 100 mg/dL — ABNORMAL HIGH (ref 70–99)
HCT: 43 % (ref 36.0–46.0)
Hemoglobin: 14.6 g/dL (ref 12.0–15.0)
Potassium: 3.9 mmol/L (ref 3.5–5.1)
Sodium: 144 mmol/L (ref 135–145)
TCO2: 20 mmol/L — ABNORMAL LOW (ref 22–32)

## 2022-11-18 LAB — I-STAT BETA HCG BLOOD, ED (MC, WL, AP ONLY): I-stat hCG, quantitative: <5 m[IU]/mL (ref <5)

## 2022-11-18 LAB — LIPASE, BLOOD: Lipase: 39 U/L (ref 11–51)

## 2022-11-18 MED ORDER — SODIUM CHLORIDE (PF) 0.9 % IJ SOLN
INTRAMUSCULAR | Status: AC
  Filled 2022-11-18: qty 50

## 2022-11-18 MED ORDER — ONDANSETRON HCL 4 MG/2ML IJ SOLN
4.0000 mg | Freq: Once | INTRAMUSCULAR | Status: AC
  Administered 2022-11-18: 4 mg via INTRAVENOUS
  Filled 2022-11-18: qty 2

## 2022-11-18 MED ORDER — OXYCODONE-ACETAMINOPHEN 5-325 MG PO TABS: 1.0000 | ORAL_TABLET | Freq: Four times a day (QID) | ORAL | 0 refills | Status: DC | PRN

## 2022-11-18 MED ORDER — AMOXICILLIN-POT CLAVULANATE 875-125 MG PO TABS: 1.0000 | ORAL_TABLET | Freq: Two times a day (BID) | ORAL | 0 refills | Status: DC

## 2022-11-18 MED ORDER — ONDANSETRON 4 MG PO TBDP: 4.0000 mg | ORAL_TABLET | Freq: Three times a day (TID) | ORAL | 0 refills | Status: DC | PRN

## 2022-11-18 MED ORDER — IOHEXOL 300 MG/ML  SOLN
100.0000 mL | Freq: Once | INTRAMUSCULAR | Status: AC | PRN
Start: 2022-11-18 — End: 2022-11-18
  Administered 2022-11-18: 100 mL via INTRAVENOUS

## 2022-11-18 MED ORDER — FENTANYL CITRATE PF 50 MCG/ML IJ SOSY
50.0000 ug | PREFILLED_SYRINGE | Freq: Once | INTRAMUSCULAR | Status: AC
  Administered 2022-11-18: 50 ug via INTRAVENOUS
  Filled 2022-11-18: qty 1

## 2022-11-18 NOTE — ED Provider Triage Note (Signed)
Emergency Medicine Provider Triage Evaluation Note  Patricia Erickson , a 52 y.o. female  was evaluated in triage.  Pt complains of left lower quadrant abdominal pain that started last night and has gradually worsened.  She has had some associated diarrhea, but she states this is not abnormal for her.  She denies any urinary symptoms, vaginal discharge or bleeding, no new bowel changes, no nausea or vomiting.  Endorses possible history of diverticulitis.  Only abdominal surgery was cholecystectomy    Review of Systems  Positive:  Negative:   Physical Exam  BP (!) 149/84 (BP Location: Right Arm) Comment: Simultaneous filing. User may not have seen previous data. Comment (BP Location): Simultaneous filing. User may not have seen previous data.  Pulse 95 Comment: Simultaneous filing. User may not have seen previous data.  Temp 98.1 F (36.7 C) (Oral) Comment: Simultaneous filing. User may not have seen previous data. Comment (Src): Simultaneous filing. User may not have seen previous data.  Resp 18 Comment: Simultaneous filing. User may not have seen previous data.  SpO2 100% Comment: Simultaneous filing. User may not have seen previous data. Gen:   Awake, no distress   Resp:  Normal effort  MSK:   Moves extremities without difficulty  Other:  Moderate left lower quadrant tenderness, no CVA tenderness  Medical Decision Making  Medically screening exam initiated at 8:47 AM.  Appropriate orders placed.  Patricia Erickson was informed that the remainder of the evaluation will be completed by another provider, this initial triage assessment does not replace that evaluation, and the importance of remaining in the ED until their evaluation is complete.     Adolphus Birchwood, [place] 11/18/22 [phone]

## 2022-11-18 NOTE — ED Provider Notes (Signed)
[facility] DEPT Provider Note   CSN: [id number] Arrival date & time: 11/18/22  3299     History  Chief Complaint  Patient presents with   Abdominal Pain    Patricia Erickson is a 51 y.o. female.   Abdominal Pain    51 year old female with medical history significant for nephrolithiasis who presents emergency department with left lower quadrant abdominal pain that has been present for the past day.  She has had some mild diarrhea.  She denies any urinary symptoms or vaginal discharge or bleeding.  She denies any bowel changes.  She denies any nausea or vomiting.  She does have a history of cholecystectomy.  Home Medications Prior to Admission medications   Medication Sig Start Date End Date Taking? Authorizing Provider  amoxicillin-clavulanate (AUGMENTIN) 875-125 MG tablet Take 1 tablet by mouth every 12 (twelve) hours. 11/18/22  Yes Regan Lemming, MD  ondansetron (ZOFRAN-ODT) 4 MG disintegrating tablet Take 1 tablet (4 mg total) by mouth every 8 (eight) hours as needed for nausea or vomiting. 11/18/22  Yes Regan Lemming, MD  oxyCODONE-acetaminophen (PERCOCET/ROXICET) 5-325 MG tablet Take 1-2 tablets by mouth every 6 (six) hours as needed for severe pain. 11/18/22  Yes Regan Lemming, MD  Bacillus Coagulans-Inulin (PROBIOTIC-PREBIOTIC PO) Take 3.6 g by mouth daily.    [provider]  baclofen (LIORESAL) 10 MG tablet Take 10 mg by mouth 2 (two) times daily as needed (Migraine).  07/21/19   [provider]  chlorproMAZINE (THORAZINE) 25 MG tablet Take 25 mg by mouth 4 (four) times daily as needed (migraine).  07/11/19   [provider]  levonorgestrel (MIRENA, 52 MG,) 20 MCG/DAY IUD Mirena 21 mcg/24 hours (8 yrs) 52 mg intrauterine device  Take by intrauterine route. 12/09/17   [provider]  meloxicam (MOBIC) 15 MG tablet Take 15 mg by mouth 3 (three) times daily. 05/13/22   [provider]  naproxen  (NAPROSYN) 375 MG tablet Take 1 tablet (375 mg total) by mouth 2 (two) times daily. 05/13/22   [name], PA-C  nitrofurantoin, macrocrystal-monohydrate, (MACROBID) 100 MG capsule Take 1 capsule (100 mg total) by mouth 2 (two) times daily. 07/09/22   Marge Duncans, PA-C  phenazopyridine (PYRIDIUM) 200 MG tablet Take 1 tablet (200 mg total) by mouth 3 (three) times daily as needed for pain. 06/25/22   Marge Duncans, PA-C  topiramate (TOPAMAX) 200 MG tablet Take 300 mg by mouth at bedtime.     [provider]  prochlorperazine (COMPAZINE) 10 MG tablet Take 1 tablet (10 mg total) by mouth 2 (two) times daily as needed for nausea or vomiting. 07/16/18 08/26/19  Tegeler, Gwenyth Allegra, MD      Allergies    Other and Propoxyphene n-acetaminophen    Review of Systems   Review of Systems  Gastrointestinal:  Positive for abdominal pain.  All other systems reviewed and are negative.   Physical Exam Updated Vital Signs BP (!) 138/91   Pulse 88   Temp 98.1 F (36.7 C)   Resp 18   SpO2 98%  Physical Exam Vitals and nursing note reviewed.  Constitutional:      General: She is not in acute distress.    Appearance: She is well-developed.  HENT:     Head: Normocephalic and atraumatic.  Eyes:     Conjunctiva/sclera: Conjunctivae normal.  Cardiovascular:     Rate and Rhythm: Normal rate and regular rhythm.     Heart sounds: No murmur heard. Pulmonary:  Effort: Pulmonary effort is normal. No respiratory distress.     Breath sounds: Normal breath sounds.  Abdominal:     Palpations: Abdomen is soft.     Tenderness: There is abdominal tenderness in the left lower quadrant. There is no guarding or rebound.  Musculoskeletal:        General: No swelling.     Cervical back: Neck supple.  Skin:    General: Skin is warm and dry.     Capillary Refill: Capillary refill takes less than 2 seconds.  Neurological:     Mental Status: She is alert.  Psychiatric:        Mood and Affect: Mood  normal.     ED Results / Procedures / Treatments   Labs (all labs ordered are listed, but only abnormal results are displayed) Labs Reviewed  CBC WITH DIFFERENTIAL/PLATELET - Abnormal; Notable for the following components:      Result Value   WBC 11.3 (*)    Neutro Abs 8.3 (*)    All other components within normal limits  COMPREHENSIVE METABOLIC PANEL - Abnormal; Notable for the following components:   Chloride 114 (*)    Glucose, Bld 106 (*)    Calcium 8.6 (*)    All other components within normal limits  URINALYSIS, ROUTINE W REFLEX MICROSCOPIC - Abnormal; Notable for the following components:   Color, Urine STRAW (*)    All other components within normal limits  I-STAT CHEM 8, ED - Abnormal; Notable for the following components:   Glucose, Bld 100 (*)    TCO2 20 (*)    All other components within normal limits  LIPASE, BLOOD  I-STAT BETA HCG BLOOD, ED ([facility], [facility], [facility] ONLY)    EKG None  Radiology CT ABDOMEN PELVIS W CONTRAST  Result Date: 11/18/2022 CLINICAL DATA:  Left lower quadrant abdominal pain EXAM: CT ABDOMEN AND PELVIS WITH CONTRAST TECHNIQUE: Multidetector CT imaging of the abdomen and pelvis was performed using the standard protocol following bolus administration of intravenous contrast. RADIATION DOSE REDUCTION: This exam was performed according to the departmental dose-optimization program which includes automated exposure control, adjustment of the mA and/or kV according to patient size and/or use of iterative reconstruction technique. CONTRAST:  120m OMNIPAQUE IOHEXOL 300 MG/ML  SOLN COMPARISON:  08/09/22 CT Urogram FINDINGS: Lower chest: Dependent atelectasis. Hepatobiliary: Liver is normal contour. No focal liver lesions are visualized. Portal veins are contrast opacified. Status post cholecystectomy. No evidence of intra or extrahepatic biliary ductal dilatation. Pancreas: No evidence of peripancreatic fat stranding. No pancreatic ductal dilatation. Spleen: Normal in  size without focal abnormality. Adrenals/Urinary Tract: Bilateral adrenal glands are normal in appearance. There are small bilateral nonobstructing renal stones, unchanged from prior exam. No evidence of hydronephrosis. There is homogeneous contrast enhancement in bilateral kidneys. Urinary bladder is fluid-filled without focal abnormality. Stomach/Bowel: The stomach, small bowel, large bowel are normal in caliber. No evidence of bowel obstruction. There is diverticulosis with findings suggestive of diverticulitis at the descending/sigmoid colon junction. No evidence of pneumoperitoneum. No evidence of surrounding abscess. The appendix is normal in appearance. Vascular/Lymphatic: No significant vascular findings are present. No enlarged abdominal or pelvic lymph nodes. Reproductive: Uterus and bilateral adnexa are unremarkable. There is an IUD in place. Other: No abdominal wall hernia or abnormality. No abdominopelvic ascites. Musculoskeletal: No acute or significant osseous findings. IMPRESSION: 1. Diverticulosis with findings suggestive of diverticulitis at the descending/sigmoid colon junction. No evidence of pneumoperitoneum or surrounding abscess. 2. Small bilateral nonobstructing renal stones, unchanged  from prior exam. Electronically Signed   By: Marin Roberts M.D.   On: 11/18/2022 11:44    Procedures Procedures    Medications Ordered in ED Medications  sodium chloride (PF) 0.9 % injection (  Given by Other 11/18/22 1723)  iohexol (OMNIPAQUE) 300 MG/ML solution 100 mL (100 mLs Intravenous Contrast Given 11/18/22 1118)  fentaNYL (SUBLIMAZE) injection 50 mcg (50 mcg Intravenous Given 11/18/22 1803)  ondansetron (ZOFRAN) injection 4 mg (4 mg Intravenous Given 11/18/22 1802)    ED Course/ Medical Decision Making/ A&P                           Medical Decision Making Risk Prescription drug management.     51 year old female with medical history significant for nephrolithiasis who presents  emergency department with left lower quadrant abdominal pain that has been present for the past day.  She has had some mild diarrhea.  She denies any urinary symptoms or vaginal discharge or bleeding.  She denies any bowel changes.  She denies any nausea or vomiting.  She does have a history of cholecystectomy.  On arrival, the patient was vitally stable.  Physical exam significant for left lower quadrant tenderness to palpation.  Differential diagnosis includes pyelonephritis, nephrolithiasis, diverticulitis.  Laboratory evaluation significant for CMP without significant abnormality, CBC with mild leukocytosis to 11.3, hCG normal, urinalysis negative.  The patient was administered Zofran and fentanyl for pain control and nausea.  Her CT abdomen pelvis was concerning for mild uncomplicated diverticulitis: IMPRESSION:  1. Diverticulosis with findings suggestive of diverticulitis at the  descending/sigmoid colon junction. No evidence of pneumoperitoneum  or surrounding abscess.  2. Small bilateral nonobstructing renal stones, unchanged from prior  exam.    We will discharge the patient on Zofran, Percocet, Augmentin.  Return precautions provided.  Stable for discharge and outpatient management.   Final Clinical Impression(s) / ED Diagnoses Final diagnoses:  Diverticulitis    Rx / DC Orders ED Discharge Orders          Ordered    amoxicillin-clavulanate (AUGMENTIN) 875-125 MG tablet  Every 12 hours        11/18/22 1835    ondansetron (ZOFRAN-ODT) 4 MG disintegrating tablet  Every 8 hours PRN        11/18/22 1835    oxyCODONE-acetaminophen (PERCOCET/ROXICET) 5-325 MG tablet  Every 6 hours PRN        11/18/22 1837              Regan Lemming, MD 11/18/22 1841

## 2022-11-18 NOTE — ED Triage Notes (Signed)
Patient said since yesterday her left side of her abdomen feels like it stabbing. No vomiting but said her stools are loose. Tender to touch.

## 2022-11-18 NOTE — ED Notes (Signed)
Pt requested to have her rx changed to a different pharmacy, dr. Armandina Gemma notified and said he would change it to the [facility]

## 2022-11-18 NOTE — Discharge Instructions (Addendum)
We will discharge her on a course of Augmentin for 7 days with Zofran for nausea.  I have also prescribed Norco for pain control.  Return to the emergency department for any severe worsening of your symptoms, inability to tolerate oral intake or any other clinical concern.

## 2022-11-22 ENCOUNTER — Telehealth: Payer: Self-pay

## 2022-11-22 ENCOUNTER — Other Ambulatory Visit: Payer: Self-pay | Admitting: Physician Assistant

## 2022-11-22 MED ORDER — FLUCONAZOLE 150 MG PO TABS: 150.0000 mg | ORAL_TABLET | Freq: Every day | ORAL | 0 refills | Status: DC

## 2022-11-22 NOTE — Telephone Encounter (Signed)
Pharmacy: [facility]  Patient called this morning stating that she left a message on the nurse phone requesting a medicine be sent into the pharmacy for a yeast infection. She was seen that the hospital on Monday was [facility] and was given Augmentin.

## 2022-11-22 NOTE — Telephone Encounter (Signed)
Rx sent for diflucan - pt advised not to take thorazine at same time

## 2022-12-05 ENCOUNTER — Other Ambulatory Visit: Payer: Self-pay | Admitting: Physician Assistant

## 2023-03-24 ENCOUNTER — Encounter: Payer: Self-pay | Admitting: *Deleted

## 2023-05-19 ENCOUNTER — Encounter: Payer: 59 | Admitting: Physician Assistant

## 2023-06-03 ENCOUNTER — Emergency Department (HOSPITAL_COMMUNITY): Payer: 59

## 2023-06-03 ENCOUNTER — Emergency Department (HOSPITAL_COMMUNITY)
Admission: EM | Admit: 2023-06-03 | Discharge: 2023-06-03 | Disposition: A | Discharge status: Home / Self Care | Payer: 59 | Attending: Student | Admitting: Student

## 2023-06-03 DIAGNOSIS — K5732 Diverticulitis of large intestine without perforation or abscess without bleeding: Secondary | ICD-10-CM | POA: Diagnosis not present

## 2023-06-03 DIAGNOSIS — Z87891 Personal history of nicotine dependence: Secondary | ICD-10-CM | POA: Diagnosis not present

## 2023-06-03 DIAGNOSIS — R1032 Left lower quadrant pain: Secondary | ICD-10-CM | POA: Diagnosis present

## 2023-06-03 DIAGNOSIS — K5792 Diverticulitis of intestine, part unspecified, without perforation or abscess without bleeding: Secondary | ICD-10-CM

## 2023-06-03 LAB — COMPREHENSIVE METABOLIC PANEL
ALT: 19 U/L (ref 0–44)
AST: 18 U/L (ref 15–41)
Albumin: 4.1 g/dL (ref 3.5–5.0)
Alkaline Phosphatase: 84 U/L (ref 38–126)
Anion gap: 9 (ref 5–15)
BUN: 15 mg/dL (ref 6–20)
CO2: 21 mmol/L — ABNORMAL LOW (ref 22–32)
Calcium: 8.8 mg/dL — ABNORMAL LOW (ref 8.9–10.3)
Chloride: 108 mmol/L (ref 98–111)
Creatinine, Ser: 0.82 mg/dL (ref 0.44–1.00)
GFR, Estimated: >60 mL/min (ref >60)
Glucose, Bld: 117 mg/dL — ABNORMAL HIGH (ref 70–99)
Potassium: 3.7 mmol/L (ref 3.5–5.1)
Sodium: 138 mmol/L (ref 135–145)
Total Bilirubin: 1 mg/dL (ref 0.3–1.2)
Total Protein: 7.4 g/dL (ref 6.5–8.1)

## 2023-06-03 LAB — URINALYSIS, ROUTINE W REFLEX MICROSCOPIC
Bilirubin Urine: NEGATIVE
Glucose, UA: NEGATIVE mg/dL
Hgb urine dipstick: NEGATIVE
Ketones, ur: NEGATIVE mg/dL
Nitrite: POSITIVE — AB
Protein, ur: NEGATIVE mg/dL
Specific Gravity, Urine: 1.017 (ref 1.005–1.030)
pH: 5 (ref 5.0–8.0)

## 2023-06-03 LAB — CBC
HCT: 45 % (ref 36.0–46.0)
Hemoglobin: 14.6 g/dL (ref 12.0–15.0)
MCH: 29.4 pg (ref 26.0–34.0)
MCHC: 32.4 g/dL (ref 30.0–36.0)
MCV: 90.7 fL (ref 80.0–100.0)
Platelets: 279 10*3/uL (ref 150–400)
RBC: 4.96 MIL/uL (ref 3.87–5.11)
RDW: 13.1 % (ref 11.5–15.5)
WBC: 12.5 10*3/uL — ABNORMAL HIGH (ref 4.0–10.5)
nRBC: 0 % (ref 0.0–0.2)

## 2023-06-03 LAB — HCG, SERUM, QUALITATIVE: Preg, Serum: NEGATIVE

## 2023-06-03 LAB — LIPASE, BLOOD: Lipase: 31 U/L (ref 11–51)

## 2023-06-03 MED ORDER — AMOXICILLIN-POT CLAVULANATE 875-125 MG PO TABS: 1.0000 | ORAL_TABLET | Freq: Two times a day (BID) | ORAL | 0 refills | Status: DC

## 2023-06-03 MED ORDER — FLUCONAZOLE 150 MG PO TABS: 150.0000 mg | ORAL_TABLET | Freq: Once | ORAL | 0 refills | Status: DC | PRN

## 2023-06-03 MED ORDER — IOHEXOL 300 MG/ML  SOLN
100.0000 mL | Freq: Once | INTRAMUSCULAR | Status: AC | PRN
  Administered 2023-06-03: 100 mL via INTRAVENOUS

## 2023-06-03 MED ORDER — AMOXICILLIN-POT CLAVULANATE 875-125 MG PO TABS
1.0000 | ORAL_TABLET | Freq: Once | ORAL | Status: AC
  Administered 2023-06-03: 1 via ORAL
  Filled 2023-06-03: qty 1

## 2023-06-03 MED ORDER — ONDANSETRON 4 MG PO TBDP: 4.0000 mg | ORAL_TABLET | Freq: Three times a day (TID) | ORAL | 0 refills | Status: DC | PRN

## 2023-06-03 MED ORDER — NAPROXEN 375 MG PO TABS: 375.0000 mg | ORAL_TABLET | Freq: Two times a day (BID) | ORAL | 0 refills | Status: DC

## 2023-06-03 MED ORDER — HYDROCODONE-ACETAMINOPHEN 5-325 MG PO TABS
1.0000 | ORAL_TABLET | Freq: Once | ORAL | Status: AC
  Administered 2023-06-03: 1 via ORAL
  Filled 2023-06-03: qty 1

## 2023-06-03 MED ORDER — LACTATED RINGERS IV BOLUS
1000.0000 mL | Freq: Once | INTRAVENOUS | Status: AC
  Administered 2023-06-03: 1000 mL via INTRAVENOUS

## 2023-06-03 MED ORDER — ONDANSETRON HCL 4 MG/2ML IJ SOLN
4.0000 mg | Freq: Once | INTRAMUSCULAR | Status: AC
  Administered 2023-06-03: 4 mg via INTRAVENOUS
  Filled 2023-06-03: qty 2

## 2023-06-03 MED ORDER — KETOROLAC TROMETHAMINE 15 MG/ML IJ SOLN
15.0000 mg | Freq: Once | INTRAMUSCULAR | Status: AC
  Administered 2023-06-03: 15 mg via INTRAVENOUS
  Filled 2023-06-03: qty 1

## 2023-06-03 NOTE — ED Triage Notes (Signed)
Patient here from home reporting abd pain under belly button radiating to back that started yest.

## 2023-06-03 NOTE — ED Notes (Signed)
Lab called to add on urine culture.  ?

## 2023-06-04 ENCOUNTER — Ambulatory Visit: Payer: 59 | Admitting: Physician Assistant

## 2023-06-04 ENCOUNTER — Encounter: Payer: Self-pay | Admitting: Physician Assistant

## 2023-06-04 VITALS — BP 124/76 | HR 76 | Temp 97.7°F | Ht 66.0 in | Wt 197.0 lb

## 2023-06-04 DIAGNOSIS — K5792 Diverticulitis of intestine, part unspecified, without perforation or abscess without bleeding: Secondary | ICD-10-CM | POA: Insufficient documentation

## 2023-06-04 DIAGNOSIS — N3000 Acute cystitis without hematuria: Secondary | ICD-10-CM | POA: Diagnosis not present

## 2023-06-04 NOTE — ED Provider Notes (Signed)
[place] [facility] AT [facility] Provider Note  CSN: [id number] Arrival date & time: 06/03/23 1634  Chief Complaint(s) Abdominal Pain  HPI Patricia Erickson is a 52 y.o. female with PMH nephrolithiasis, diverticulitis who presents emergency department for evaluation of left lower quadrant abdominal pain and suprapubic abdominal pain.  She states that pain began yesterday and has been progressively worsening.  Endorses nausea but denies vomiting.  Denies chest pain, shortness of breath, fever, headache or other systemic symptoms.   Past Medical History Past Medical History:  Diagnosis Date   COVID    History of kidney stones    Migraines    hz of migraines    Polyp of colon 03/11/2007   BENIGN   Patient Active Problem List   Diagnosis Date Noted   Low back pain 03/07/2022   Lumbar radiculopathy 02/21/2022   Cervical radiculopathy 02/21/2022   Vaginal discharge 03/19/2017   Vaginal burning 03/19/2017   Obesity, Class I, BMI 30-34.9 03/03/2017   Flu-like symptoms 01/02/2017   Genetic testing 12/18/2015   Gallstones 10/17/2015   Symptomatic cholelithiasis 10/17/2015   Neck pain 07/05/2015   Healthcare maintenance 07/05/2015   Intermittent palpitations 07/05/2015   Family history of colon cancer 10/12/2012   Family history of breast cancer in female 10/12/2012   Migraine headache 01/19/2009   HEMORRHOIDS 01/10/2009   COLONIC POLYPS, HX OF 01/10/2009   Home Medication(s) Prior to Admission medications   Medication Sig Start Date End Date Taking? Authorizing Provider  amoxicillin-clavulanate (AUGMENTIN) 875-125 MG tablet Take 1 tablet by mouth every 12 (twelve) hours. 06/03/23   Sayid Moll, MD  Bacillus Coagulans-Inulin (PROBIOTIC-PREBIOTIC PO) Take 3.6 g by mouth daily.    [provider]  baclofen (LIORESAL) 10 MG tablet Take 10 mg by mouth 2 (two) times daily as needed (Migraine).  07/21/19   [provider]  chlorproMAZINE  (THORAZINE) 25 MG tablet Take 25 mg by mouth 4 (four) times daily as needed (migraine).  07/11/19   [provider]  fluconazole (DIFLUCAN) 150 MG tablet TAKE 1 TABLET BY MOUTH EVERY DAY 12/05/22   Cox, Fritzi Mandes, MD  fluconazole (DIFLUCAN) 150 MG tablet Take 1 tablet (150 mg total) by mouth once as needed for up to 1 dose (after finishing abx). 06/03/23   Kyler Lerette, MD  levonorgestrel (MIRENA, 52 MG,) 20 MCG/DAY IUD Mirena 21 mcg/24 hours (8 yrs) 52 mg intrauterine device  Take by intrauterine route. 12/09/17   [provider]  meloxicam (MOBIC) 15 MG tablet Take 15 mg by mouth 3 (three) times daily. 05/13/22   [provider]  naproxen (NAPROSYN) 375 MG tablet Take 1 tablet (375 mg total) by mouth 2 (two) times daily. 06/03/23   Kynslee Baham, MD  nitrofurantoin, macrocrystal-monohydrate, (MACROBID) 100 MG capsule Take 1 capsule (100 mg total) by mouth 2 (two) times daily. 07/09/22   Marianne Sofia, PA-C  ondansetron (ZOFRAN-ODT) 4 MG disintegrating tablet Take 1 tablet (4 mg total) by mouth every 8 (eight) hours as needed for nausea or vomiting. 06/03/23   Jerzee Jerome, MD  oxyCODONE-acetaminophen (PERCOCET/ROXICET) 5-325 MG tablet Take 1-2 tablets by mouth every 6 (six) hours as needed for severe pain. 11/18/22   Ernie Avena, MD  phenazopyridine (PYRIDIUM) 200 MG tablet Take 1 tablet (200 mg total) by mouth 3 (three) times daily as needed for pain. 06/25/22   Marianne Sofia, PA-C  topiramate (TOPAMAX) 200 MG tablet Take 300 mg by mouth at bedtime.     [provider]  prochlorperazine (COMPAZINE) 10 MG tablet Take 1 tablet (10 mg total) by mouth 2 (two) times daily as needed for nausea or vomiting. 07/16/18 08/26/19  Tegeler, Canary Brim, MD                                                                                                                                    Past Surgical History Past Surgical History:  Procedure Laterality Date   CESAREAN SECTION  2000    CHOLECYSTECTOMY N/A 10/18/2015   Procedure: LAPAROSCOPIC CHOLECYSTECTOMY;  Surgeon: Rodman Pickle, MD;  Location: [facility] OR;  Service: General;  Laterality: N/A;   CYSTOSCOPY/URETEROSCOPY/HOLMIUM LASER/STENT PLACEMENT Left 07/31/2020   Procedure: CYSTOSCOPY/URETEROSCOPY/HOLMIUM LASER/STENT PLACEMENT;  Surgeon: Noel Christmas, MD;  Location: [facility] ORS;  Service: Urology;  Laterality: Left;  90 MINS   LAPAROSCOPIC CHOLECYSTECTOMY  nov 9 16   Family History Family History  Problem Relation Age of Onset   Osteoporosis Mother    Breast cancer Mother 17   Colon cancer Mother 87       removed large intestine   Lymphoma Mother 42   Hypertension Father    Osteoporosis Maternal Grandmother    Lymphoma Maternal Grandmother 68   Colon polyps Sister        "several" dx. 86 or younger   Other Sister        hysterectomy at 20; suspicious breast finding removed at 31   Colon cancer Maternal Aunt 43   Lymphoma Maternal Uncle 63   Lung cancer Maternal Grandfather 66       metastatic to bone; former smoker   Throat cancer Paternal Grandmother 89       not a smoker   Heart Problems Paternal Grandfather    Other Other        suspicious breast finding at 47 - recent follow-up normal   Heart Problems Maternal Uncle    Bladder Cancer Paternal Uncle 86   Throat cancer Paternal Uncle        dx. before age 36    Social History Social History   Tobacco Use   Smoking status: Former    Packs/day: 0.50    Years: 7.00    Additional pack years: 0.00    Total pack years: 3.50    Types: Cigarettes    Quit date: 10/12/1992    Years since quitting: 30.6   Smokeless tobacco: Never  Vaping Use   Vaping Use: Never used  Substance Use Topics   Alcohol use: Not Currently    Alcohol/week: 0.0 standard drinks of alcohol   Drug use: No   Allergies Other and Propoxyphene n-acetaminophen  Review of Systems Review of Systems  Gastrointestinal:  Positive for abdominal pain and nausea.    Physical  Exam Vital Signs  I have reviewed the triage vital signs BP 121/78   Pulse 81   Temp 98.3 F (36.8 C) (Oral)   Resp  16   SpO2 98%   Physical Exam Vitals and nursing note reviewed.  Constitutional:      General: She is not in acute distress.    Appearance: She is well-developed.  HENT:     Head: Normocephalic and atraumatic.  Eyes:     Conjunctiva/sclera: Conjunctivae normal.  Cardiovascular:     Rate and Rhythm: Normal rate and regular rhythm.     Heart sounds: No murmur heard. Pulmonary:     Effort: Pulmonary effort is normal. No respiratory distress.     Breath sounds: Normal breath sounds.  Abdominal:     Palpations: Abdomen is soft.     Tenderness: There is abdominal tenderness in the suprapubic area and left lower quadrant.  Musculoskeletal:        General: No swelling.     Cervical back: Neck supple.  Skin:    General: Skin is warm and dry.     Capillary Refill: Capillary refill takes less than 2 seconds.  Neurological:     Mental Status: She is alert.  Psychiatric:        Mood and Affect: Mood normal.     ED Results and Treatments Labs (all labs ordered are listed, but only abnormal results are displayed) Labs Reviewed  COMPREHENSIVE METABOLIC PANEL - Abnormal; Notable for the following components:      Result Value   CO2 21 (*)    Glucose, Bld 117 (*)    Calcium 8.8 (*)    All other components within normal limits  CBC - Abnormal; Notable for the following components:   WBC 12.5 (*)    All other components within normal limits  URINALYSIS, ROUTINE W REFLEX MICROSCOPIC - Abnormal; Notable for the following components:   Nitrite POSITIVE (*)    Leukocytes,Ua MODERATE (*)    Bacteria, UA RARE (*)    All other components within normal limits  URINE CULTURE  LIPASE, BLOOD  HCG, SERUM, QUALITATIVE                                                                                                                          Radiology CT ABDOMEN PELVIS W  CONTRAST  Result Date: 06/03/2023 CLINICAL DATA:  Left lower quadrant abdominal pain. EXAM: CT ABDOMEN AND PELVIS WITH CONTRAST TECHNIQUE: Multidetector CT imaging of the abdomen and pelvis was performed using the standard protocol following bolus administration of intravenous contrast. RADIATION DOSE REDUCTION: This exam was performed according to the departmental dose-optimization program which includes automated exposure control, adjustment of the mA and/or kV according to patient size and/or use of iterative reconstruction technique. CONTRAST:  OMNIPAQUE IOHEXOL 300 MG/ML  SOLN COMPARISON:  CT abdomen pelvis dated 11/18/2022. FINDINGS: Lower chest: The visualized lung bases are clear. No intra-abdominal free air.  Trace free fluid within the pelvis. Hepatobiliary: The liver is unremarkable. There is mild biliary dilatation, post cholecystectomy. No retained calcified stone noted in the central CBD. Pancreas: Unremarkable. No pancreatic ductal dilatation  or surrounding inflammatory changes. Spleen: Normal in size without focal abnormality. Adrenals/Urinary Tract: The adrenal glands are unremarkable. There is a 3 mm nonobstructing left renal inferior pole calculus. No hydronephrosis. The right kidney is unremarkable. There is symmetric enhancement and excretion of contrast by both kidneys. The visualized ureters and urinary bladder appear unremarkable. Stomach/Bowel: There is sigmoid diverticulosis. There is focal area of active inflammatory changes consistent with acute diverticulitis. No diverticular abscess or perforation. There is no bowel obstruction. The appendix is normal. Vascular/Lymphatic: The abdominal aorta and IVC are unremarkable. No portal venous gas. There is no adenopathy. Reproductive: The uterus is anteverted. And intrauterine device is noted. No adnexal masses. Other: None Musculoskeletal: No acute or significant osseous findings. IMPRESSION: 1. Sigmoid diverticulitis. No diverticular  abscess or perforation. 2. A 3 mm nonobstructing left renal inferior pole calculus. No hydronephrosis. Electronically Signed   By: Elgie Collard M.D.   On: 06/03/2023 20:15    Pertinent labs & imaging results that were available during my care of the patient were reviewed by me and considered in my medical decision making (see MDM for details).  Medications Ordered in ED Medications  ondansetron (ZOFRAN) injection 4 mg (4 mg Intravenous Given 06/03/23 1944)  lactated ringers bolus 1,000 mL (0 mLs Intravenous Stopped 06/03/23 2057)  ketorolac (TORADOL) 15 MG/ML injection 15 mg (15 mg Intravenous Given 06/03/23 1944)  iohexol (OMNIPAQUE) 300 MG/ML solution 100 mL (100 mLs Intravenous Contrast Given 06/03/23 1959)  amoxicillin-clavulanate (AUGMENTIN) 875-125 MG per tablet 1 tablet (1 tablet Oral Given 06/03/23 2055)  HYDROcodone-acetaminophen (NORCO/VICODIN) 5-325 MG per tablet 1 tablet (1 tablet Oral Given 06/03/23 2055)                                                                                                                                     Procedures Procedures  (including critical care time)  Medical Decision Making / ED Course   This patient presents to the ED for concern of abdominal pain, nausea, this involves an extensive number of treatment options, and is a complaint that carries with it a high risk of complications and morbidity.  The differential diagnosis includes diverticulitis, epiploic appendagitis, colitis, gastroenteritis, constipation, nephrolithiasis, inflammatory bowel disease,  MDM: Patient seen emergency room for evaluation of abdominal pain and nausea.  Physical exam with suprapubic and left lower quadrant tenderness to palpation but is otherwise unremarkable.  Laboratory evaluation with mild leukocytosis to 12.5, CO2 21, urinalysis with positive nitrites, moderate leuk esterase, 21-50 white blood cells and rare bacteria.  Urine culture sent.  CT abdomen pelvis is  concerning for sigmoid diverticulitis.  We will cover with Augmentin as the patient's symptoms do appear more consistent with diverticulitis than cystitis and let culture data Drive appropriate additional antibiotic therapy if Augmentin will not cover urinary findings.  Patient given pain and nausea control and on reevaluation symptoms have improved.  Patient then discharged with Augmentin and  at this time does not meet inpatient criteria for admission.  She was given return precautions which she voiced understanding and discharged with outpatient follow-up.   Additional history obtained:  -External records from outside source obtained and reviewed including: Chart review including previous notes, labs, imaging, consultation notes   Lab Tests: -I ordered, reviewed, and interpreted labs.   The pertinent results include:   Labs Reviewed  COMPREHENSIVE METABOLIC PANEL - Abnormal; Notable for the following components:      Result Value   CO2 21 (*)    Glucose, Bld 117 (*)    Calcium 8.8 (*)    All other components within normal limits  CBC - Abnormal; Notable for the following components:   WBC 12.5 (*)    All other components within normal limits  URINALYSIS, ROUTINE W REFLEX MICROSCOPIC - Abnormal; Notable for the following components:   Nitrite POSITIVE (*)    Leukocytes,Ua MODERATE (*)    Bacteria, UA RARE (*)    All other components within normal limits  URINE CULTURE  LIPASE, BLOOD  HCG, SERUM, QUALITATIVE      Imaging Studies ordered: I ordered imaging studies including CT abdomen pelvis I independently visualized and interpreted imaging. I agree with the radiologist interpretation   Medicines ordered and prescription drug management: Meds ordered this encounter  Medications   ondansetron (ZOFRAN) injection 4 mg   lactated ringers bolus 1,000 mL   ketorolac (TORADOL) 15 MG/ML injection 15 mg   iohexol (OMNIPAQUE) 300 MG/ML solution 100 mL   amoxicillin-clavulanate  (AUGMENTIN) 875-125 MG per tablet 1 tablet   DISCONTD: amoxicillin-clavulanate (AUGMENTIN) 875-125 MG tablet    Sig: Take 1 tablet by mouth every 12 (twelve) hours.    Dispense:  14 tablet    Refill:  0   DISCONTD: ondansetron (ZOFRAN-ODT) 4 MG disintegrating tablet    Sig: Take 1 tablet (4 mg total) by mouth every 8 (eight) hours as needed for nausea or vomiting.    Dispense:  20 tablet    Refill:  0   DISCONTD: naproxen (NAPROSYN) 375 MG tablet    Sig: Take 1 tablet (375 mg total) by mouth 2 (two) times daily.    Dispense:  20 tablet    Refill:  0   HYDROcodone-acetaminophen (NORCO/VICODIN) 5-325 MG per tablet 1 tablet   DISCONTD: fluconazole (DIFLUCAN) 150 MG tablet    Sig: Take 1 tablet (150 mg total) by mouth once as needed for up to 1 dose (after finishing abx).    Dispense:  1 tablet    Refill:  0   amoxicillin-clavulanate (AUGMENTIN) 875-125 MG tablet    Sig: Take 1 tablet by mouth every 12 (twelve) hours.    Dispense:  14 tablet    Refill:  0   fluconazole (DIFLUCAN) 150 MG tablet    Sig: Take 1 tablet (150 mg total) by mouth once as needed for up to 1 dose (after finishing abx).    Dispense:  1 tablet    Refill:  0   naproxen (NAPROSYN) 375 MG tablet    Sig: Take 1 tablet (375 mg total) by mouth 2 (two) times daily.    Dispense:  20 tablet    Refill:  0   ondansetron (ZOFRAN-ODT) 4 MG disintegrating tablet    Sig: Take 1 tablet (4 mg total) by mouth every 8 (eight) hours as needed for nausea or vomiting.    Dispense:  20 tablet    Refill:  0    -I  have reviewed the patients home medicines and have made adjustments as needed  Critical interventions none   Cardiac Monitoring: The patient was maintained on a cardiac monitor.  I personally viewed and interpreted the cardiac monitored which showed an underlying rhythm of: NSR  Social Determinants of Health:  Factors impacting patients care include: none   Reevaluation: After the interventions noted above, I  reevaluated the patient and found that they have :improved  Co morbidities that complicate the patient evaluation  Past Medical History:  Diagnosis Date   COVID    History of kidney stones    Migraines    hz of migraines    Polyp of colon 03/11/2007   BENIGN      Dispostion: I considered admission for this patient, but at this time she does not meet inpatient criteria for admission and she is safe for discharge with outpatient follow-up     Final Clinical Impression(s) / ED Diagnoses Final diagnoses:  Diverticulitis     @PCDICTATION @    Glendora Score, MD 06/04/23 [id number]

## 2023-06-04 NOTE — Progress Notes (Signed)
Subjective:  Patient ID: Patricia Erickson, female    DOB: 06-23-1971  Age: 52 y.o. MRN: [id number]  Chief Complaint  Patient presents with   Diverticulitis/UTI    HPI  Patient presents today for follow up from the ER at [facility]. Patient was seen for Diverticulitis and UTI. Patient was placed on antibiotics to treat both. Patient is needing paperwork filled out to go back to work. Patient states she is doing much better than before. She was not able to walk before due to abdominal pain and nausea. She now just has mild abdominal tenderness but states she feels ready to return to work tomorrow. Discussed work requirements and she states she feels she does not have or need any restrictions when returning to work.       06/04/2023    1:31 PM 05/15/2022    8:16 AM 04/02/2022    9:48 AM 01/02/2017   10:25 AM 07/05/2015    4:50 PM  Depression screen PHQ 2/9  Decreased Interest 0 0 0 0 0  Down, Depressed, Hopeless 0 0 0 0 0  PHQ - 2 Score 0 0 0 0 0        05/15/2022    8:15 AM  Fall Risk   Falls in the past year? 0  Number falls in past yr: 0  Injury with Fall? 0  Risk for fall due to : No Fall Risks  Follow up Falls evaluation completed    Patient Care Team: Marianne Sofia, Cordelia Poche as PCP - General (Physician Assistant)   Review of Systems  Constitutional:  Negative for appetite change, fatigue and fever.  HENT:  Negative for congestion, ear pain, sinus pressure and sore throat.   Respiratory:  Negative for cough, chest tightness, shortness of breath and wheezing.   Cardiovascular:  Negative for chest pain and palpitations.  Gastrointestinal:  Positive for abdominal pain (Left Lower Quadrant pain/Lower abdominal pain). Negative for constipation, diarrhea, nausea and vomiting.  Genitourinary:  Negative for dysuria and hematuria.  Musculoskeletal:  Negative for arthralgias, back pain, joint swelling and myalgias.  Skin:  Negative for rash.  Neurological:  Negative for  dizziness, weakness and headaches.  Psychiatric/Behavioral:  Negative for dysphoric mood. The patient is not nervous/anxious.     Current Outpatient Medications on File Prior to Visit  Medication Sig Dispense Refill   amoxicillin-clavulanate (AUGMENTIN) 875-125 MG tablet Take 1 tablet by mouth every 12 (twelve) hours. 14 tablet 0   Bacillus Coagulans-Inulin (PROBIOTIC-PREBIOTIC PO) Take 3.6 g by mouth daily.     fluconazole (DIFLUCAN) 150 MG tablet Take 1 tablet (150 mg total) by mouth once as needed for up to 1 dose (after finishing abx). 1 tablet 0   levonorgestrel (MIRENA, 52 MG,) 20 MCG/DAY IUD Mirena 21 mcg/24 hours (8 yrs) 52 mg intrauterine device  Take by intrauterine route.     naproxen (NAPROSYN) 375 MG tablet Take 1 tablet (375 mg total) by mouth 2 (two) times daily. 20 tablet 0   ondansetron (ZOFRAN-ODT) 4 MG disintegrating tablet Take 1 tablet (4 mg total) by mouth every 8 (eight) hours as needed for nausea or vomiting. 20 tablet 0   topiramate (TOPAMAX) 200 MG tablet Take 300 mg by mouth at bedtime.      [DISCONTINUED] prochlorperazine (COMPAZINE) 10 MG tablet Take 1 tablet (10 mg total) by mouth 2 (two) times daily as needed for nausea or vomiting. 10 tablet 0   No current facility-administered medications on file prior to visit.  Past Medical History:  Diagnosis Date   COVID    History of kidney stones    Migraines    hz of migraines    Polyp of colon 03/11/2007   BENIGN   Past Surgical History:  Procedure Laterality Date   CESAREAN SECTION  12/09/1998   CHOLECYSTECTOMY N/A 10/18/2015   Procedure: LAPAROSCOPIC CHOLECYSTECTOMY;  Surgeon: Rodman Pickle, MD;  Location: [facility] OR;  Service: General;  Laterality: N/A;   CYSTOSCOPY/URETEROSCOPY/HOLMIUM LASER/STENT PLACEMENT Left 07/31/2020   Procedure: CYSTOSCOPY/URETEROSCOPY/HOLMIUM LASER/STENT PLACEMENT;  Surgeon: Noel Christmas, MD;  Location: [facility] ORS;  Service: Urology;  Laterality: Left;  90 MINS   LAPAROSCOPIC  CHOLECYSTECTOMY  10/18/2015    Family History  Problem Relation Age of Onset   Osteoporosis Mother    Breast cancer Mother 7   Colon cancer Mother 56       removed large intestine   Lymphoma Mother 48   Hypertension Father    Osteoporosis Maternal Grandmother    Lymphoma Maternal Grandmother 43   Colon polyps Sister        "several" dx. 30 or younger   Other Sister        hysterectomy at 13; suspicious breast finding removed at 77   Colon cancer Maternal Aunt 43   Lymphoma Maternal Uncle 63   Lung cancer Maternal Grandfather 66       metastatic to bone; former smoker   Throat cancer Paternal Grandmother 16       not a smoker   Heart Problems Paternal Grandfather    Other Other        suspicious breast finding at 73 - recent follow-up normal   Heart Problems Maternal Uncle    Bladder Cancer Paternal Uncle 76   Throat cancer Paternal Uncle        dx. before age 39   Social History   Socioeconomic History   Marital status: Divorced    Spouse name: Not on file   Number of children: Not on file   Years of education: 14   Highest education level: Not on file  Occupational History   Not on file  Tobacco Use   Smoking status: Former    Packs/day: 0.50    Years: 7.00    Additional pack years: 0.00    Total pack years: 3.50    Types: Cigarettes    Quit date: 10/12/1992    Years since quitting: 30.6   Smokeless tobacco: Never  Vaping Use   Vaping Use: Never used  Substance and Sexual Activity   Alcohol use: Not Currently   Drug use: No   Sexual activity: Not Currently    Birth control/protection: I.U.D.    Comment: Mirena 10/29/2012  Other Topics Concern   Not on file  Social History Narrative   Not on file   Social Determinants of Health   Financial Resource Strain: Low Risk  (05/15/2022)   Overall Financial Resource Strain (CARDIA)    Difficulty of Paying Living Expenses: Not hard at all  Food Insecurity: No Food Insecurity (05/15/2022)   Hunger Vital Sign     Worried About Running Out of Food in the Last Year: Never true    Ran Out of Food in the Last Year: Never true  Transportation Needs: No Transportation Needs (05/15/2022)   PRAPARE - Administrator, Civil Service (Medical): No    Lack of Transportation (Non-Medical): No  Physical Activity: Inactive (05/15/2022)   Exercise Vital Sign  Days of Exercise per Week: 0 days    Minutes of Exercise per Session: 0 min  Stress: No Stress Concern Present (05/15/2022)   [employer] of Occupational Health - Occupational Stress Questionnaire    Feeling of Stress : Not at all  Social Connections: Moderately Isolated (05/15/2022)   Social Connection and Isolation Panel [NHANES]    Frequency of Communication with Friends and Family: More than three times a week    Frequency of Social Gatherings with Friends and Family: More than three times a week    Attends Religious Services: More than 4 times per year    Active Member of [employer] or Organizations: No    Attends Engineer, structural: Never    Marital Status: Divorced    Objective:  BP 124/76 (BP Location: Left Arm, Patient Position: Sitting)   Pulse 76   Temp 97.7 F (36.5 C) (Temporal)   Ht 5\' 6"  (1.676 m)   Wt 197 lb (89.4 kg)   SpO2 98%   BMI 31.80 kg/m      06/04/2023    1:27 PM 06/03/2023    8:56 PM 06/03/2023    5:00 PM  BP/Weight  Systolic BP 124 121 126  Diastolic BP 76 78 86  Wt. (Lbs) 197    BMI 31.8 kg/m2      Physical Exam Vitals reviewed.  Constitutional:      Appearance: Normal appearance.  Cardiovascular:     Rate and Rhythm: Normal rate and regular rhythm.     Heart sounds: Normal heart sounds.  Pulmonary:     Effort: Pulmonary effort is normal.     Breath sounds: Normal breath sounds.  Abdominal:     General: Bowel sounds are normal.     Palpations: Abdomen is soft.     Tenderness: There is abdominal tenderness in the suprapubic area and left lower quadrant. There is no right CVA tenderness or  left CVA tenderness.  Neurological:     Mental Status: She is alert and oriented to person, place, and time.  Psychiatric:        Mood and Affect: Mood normal.        Behavior: Behavior normal.     Diabetic Foot Exam - Simple   No data filed      Lab Results  Component Value Date   WBC 12.5 (H) 06/03/2023   HGB 14.6 06/03/2023   HCT 45.0 06/03/2023   PLT 279 06/03/2023   GLUCOSE 117 (H) 06/03/2023   CHOL 182 05/15/2022   TRIG 74 05/15/2022   HDL 47 05/15/2022   LDLCALC 121 (H) 05/15/2022   ALT 19 06/03/2023   AST 18 06/03/2023   NA 138 06/03/2023   K 3.7 06/03/2023   CL 108 06/03/2023   CREATININE 0.82 06/03/2023   BUN 15 06/03/2023   CO2 21 (L) 06/03/2023   TSH 0.923 05/15/2022   HGBA1C 5.5 11/21/2017      Assessment & Plan:    Diverticulitis Assessment & Plan: Medications prescribed by the ER to treat it Will continue to follow up with the patient if she continues to have symptoms Patient will set appointment if symptoms continue to occur.     Acute cystitis without hematuria Assessment & Plan: Medications prescribed by the ER to treat it Will continue to follow up with the patient if she continues to have symptoms Patient will set appointment if symptoms continue to occur.        No orders of the defined  types were placed in this encounter.   No orders of the defined types were placed in this encounter.    Follow-up: Return if symptoms worsen or fail to improve, for sally.   I,Lauren M Auman,acting as a Neurosurgeon for [employer], PA.,have documented all relevant documentation on the behalf of Langley Gauss, PA,as directed by  Langley Gauss, PA while in the presence of Langley Gauss, [place].   An After Visit Summary was printed and given to the patient.  Langley Gauss, [place] [facility] [phone]

## 2023-06-04 NOTE — Assessment & Plan Note (Signed)
Medications prescribed by the ER to treat it Will continue to follow up with the patient if she continues to have symptoms Patient will set appointment if symptoms continue to occur.   

## 2023-06-04 NOTE — Assessment & Plan Note (Signed)
Medications prescribed by the ER to treat it Will continue to follow up with the patient if she continues to have symptoms Patient will set appointment if symptoms continue to occur.

## 2023-06-05 ENCOUNTER — Inpatient Hospital Stay: Payer: 59 | Admitting: Physician Assistant

## 2023-06-06 LAB — URINE CULTURE: Culture: >=100000 — AB

## 2023-06-07 ENCOUNTER — Telehealth (HOSPITAL_BASED_OUTPATIENT_CLINIC_OR_DEPARTMENT_OTHER): Payer: Self-pay | Admitting: *Deleted

## 2023-06-07 NOTE — Telephone Encounter (Signed)
Post ED Visit - Positive Culture Follow-up: Successful Patient Follow-Up  Culture assessed and recommendations reviewed by:  []  Enzo Bi, Pharm.D. []  Celedonio Miyamoto, Pharm.D., BCPS AQ-ID []  Garvin Fila, Pharm.D., BCPS []  Georgina Pillion, [address].D., BCPS []  [place], [place].D., BCPS, AAHIVP []  Estella Husk, Pharm.D., BCPS, AAHIVP []  Lysle Pearl, PharmD, BCPS []  Phillips Climes, PharmD, BCPS []  Agapito Games, PharmD, BCPS [x] , Len Childs, PharmD  Positive urine culture  []  Patient discharged without antimicrobial prescription and treatment is now indicated [x]  Organism is resistant to prescribed ED discharge antimicrobial []  Patient with positive blood cultures  Pt is having UTi Symptoms  Changes discussed with ED provider: Marita Kansas, PA-C New antibiotic prescription Macrobid 100mg  BID x 3 days Called to [facility] [place]  Contacted patient, date 06/07/23, time 1048   Patsey Berthold 06/07/2023, 10:44 AM

## 2023-06-07 NOTE — Progress Notes (Signed)
ED Antimicrobial Stewardship Positive Culture Follow Up   Patricia Erickson is an 52 y.o. female who presented to [facility] on 06/03/2023 with a chief complaint of  Chief Complaint  Patient presents with   Abdominal Pain    Recent Results (from the past 720 hour(s))  Urine Culture (for pregnant, neutropenic or urologic patients or patients with an indwelling urinary catheter)     Status: Abnormal   Collection Time: 06/03/23  5:04 PM   Specimen: Urine, Clean Catch  Result Value Ref Range Status   Specimen Description   Final    URINE, CLEAN CATCH Performed at [facility], [address], [place], [place] [address]    Special Requests   Final    NONE Performed at [facility], [address] [place], [place] [address]    Culture >=100,000 COLONIES/mL ESCHERICHIA COLI (A)  Final   Report Status 06/06/2023 FINAL  Final   Organism ID, Bacteria ESCHERICHIA COLI (A)  Final      Susceptibility   Escherichia coli - MIC*    AMPICILLIN >=32 RESISTANT Resistant     CEFAZOLIN <=4 SENSITIVE Sensitive     CEFEPIME <=0.12 SENSITIVE Sensitive     CEFTRIAXONE <=0.25 SENSITIVE Sensitive     CIPROFLOXACIN 1 RESISTANT Resistant     GENTAMICIN <=1 SENSITIVE Sensitive     IMIPENEM <=0.25 SENSITIVE Sensitive     NITROFURANTOIN <=16 SENSITIVE Sensitive     TRIMETH/SULFA <=20 SENSITIVE Sensitive     AMPICILLIN/SULBACTAM 16 INTERMEDIATE Intermediate     PIP/TAZO <=4 SENSITIVE Sensitive     * >=100,000 COLONIES/mL ESCHERICHIA COLI    [x]  Treated with augmentin, organism resistant to prescribed antimicrobial Plan: query patient for UTI symptoms, if positive for UTI symptoms, add nitrofurantion 100 mg po BID x 3 days in addition to augmentin for diverticulitis  ED Provider: Marita Kansas, PA-C  Herby Abraham, Pharm.D Use secure chat for questions 06/07/2023 10:21 AM Clinical Pharmacist [phone]

## 2023-07-09 ENCOUNTER — Other Ambulatory Visit: Payer: Self-pay | Admitting: Physician Assistant

## 2023-07-09 DIAGNOSIS — Z1231 Encounter for screening mammogram for malignant neoplasm of breast: Secondary | ICD-10-CM

## 2023-07-17 ENCOUNTER — Ambulatory Visit
Admission: RE | Admit: 2023-07-17 | Discharge: 2023-07-17 | Disposition: A | Discharge status: Home / Self Care | Payer: 59 | Source: Ambulatory Visit | Attending: Physician Assistant | Admitting: Physician Assistant

## 2023-07-17 DIAGNOSIS — Z1231 Encounter for screening mammogram for malignant neoplasm of breast: Secondary | ICD-10-CM

## 2023-10-23 ENCOUNTER — Emergency Department (HOSPITAL_COMMUNITY): Payer: 59

## 2023-10-23 ENCOUNTER — Inpatient Hospital Stay (HOSPITAL_COMMUNITY)
Admission: EM | Admit: 2023-10-23 | Discharge: 2023-10-28 | DRG: 854 | Disposition: A | Discharge status: Home / Self Care | Payer: 59 | Attending: Student | Admitting: Student

## 2023-10-23 ENCOUNTER — Other Ambulatory Visit: Payer: Self-pay

## 2023-10-23 ENCOUNTER — Observation Stay (HOSPITAL_COMMUNITY): Payer: 59

## 2023-10-23 ENCOUNTER — Observation Stay (HOSPITAL_COMMUNITY): Payer: 59 | Admitting: Anesthesiology

## 2023-10-23 ENCOUNTER — Encounter (HOSPITAL_COMMUNITY)
Admission: EM | Disposition: A | Discharge status: Home / Self Care | Payer: Self-pay | Source: Home / Self Care | Attending: Student

## 2023-10-23 ENCOUNTER — Encounter (HOSPITAL_COMMUNITY): Payer: Self-pay | Admitting: Emergency Medicine

## 2023-10-23 ENCOUNTER — Observation Stay (HOSPITAL_BASED_OUTPATIENT_CLINIC_OR_DEPARTMENT_OTHER): Payer: 59 | Admitting: Anesthesiology

## 2023-10-23 DIAGNOSIS — Z8249 Family history of ischemic heart disease and other diseases of the circulatory system: Secondary | ICD-10-CM

## 2023-10-23 DIAGNOSIS — N201 Calculus of ureter: Principal | ICD-10-CM | POA: Diagnosis present

## 2023-10-23 DIAGNOSIS — I700 Atherosclerosis of aorta: Secondary | ICD-10-CM | POA: Diagnosis present

## 2023-10-23 DIAGNOSIS — N202 Calculus of kidney with calculus of ureter: Secondary | ICD-10-CM | POA: Diagnosis present

## 2023-10-23 DIAGNOSIS — Z8616 Personal history of COVID-19: Secondary | ICD-10-CM

## 2023-10-23 DIAGNOSIS — E669 Obesity, unspecified: Secondary | ICD-10-CM | POA: Diagnosis present

## 2023-10-23 DIAGNOSIS — Z860100 Personal history of colon polyps, unspecified: Secondary | ICD-10-CM

## 2023-10-23 DIAGNOSIS — Z1611 Resistance to penicillins: Secondary | ICD-10-CM | POA: Diagnosis present

## 2023-10-23 DIAGNOSIS — A4151 Sepsis due to Escherichia coli [E. coli]: Principal | ICD-10-CM | POA: Diagnosis present

## 2023-10-23 DIAGNOSIS — N390 Urinary tract infection, site not specified: Secondary | ICD-10-CM | POA: Diagnosis present

## 2023-10-23 DIAGNOSIS — Z9049 Acquired absence of other specified parts of digestive tract: Secondary | ICD-10-CM

## 2023-10-23 DIAGNOSIS — A419 Sepsis, unspecified organism: Secondary | ICD-10-CM | POA: Diagnosis present

## 2023-10-23 DIAGNOSIS — Z8669 Personal history of other diseases of the nervous system and sense organs: Secondary | ICD-10-CM

## 2023-10-23 DIAGNOSIS — Z975 Presence of (intrauterine) contraceptive device: Secondary | ICD-10-CM

## 2023-10-23 DIAGNOSIS — M5416 Radiculopathy, lumbar region: Secondary | ICD-10-CM | POA: Diagnosis present

## 2023-10-23 DIAGNOSIS — E66811 Obesity, class 1: Secondary | ICD-10-CM | POA: Diagnosis present

## 2023-10-23 DIAGNOSIS — E876 Hypokalemia: Secondary | ICD-10-CM | POA: Diagnosis present

## 2023-10-23 DIAGNOSIS — N1330 Unspecified hydronephrosis: Secondary | ICD-10-CM | POA: Diagnosis present

## 2023-10-23 DIAGNOSIS — N136 Pyonephrosis: Secondary | ICD-10-CM | POA: Diagnosis present

## 2023-10-23 DIAGNOSIS — Z808 Family history of malignant neoplasm of other organs or systems: Secondary | ICD-10-CM

## 2023-10-23 DIAGNOSIS — Z79899 Other long term (current) drug therapy: Secondary | ICD-10-CM

## 2023-10-23 DIAGNOSIS — R109 Unspecified abdominal pain: Secondary | ICD-10-CM | POA: Diagnosis not present

## 2023-10-23 DIAGNOSIS — Z801 Family history of malignant neoplasm of trachea, bronchus and lung: Secondary | ICD-10-CM

## 2023-10-23 DIAGNOSIS — Z87442 Personal history of urinary calculi: Secondary | ICD-10-CM

## 2023-10-23 DIAGNOSIS — R739 Hyperglycemia, unspecified: Secondary | ICD-10-CM | POA: Diagnosis present

## 2023-10-23 DIAGNOSIS — G8929 Other chronic pain: Secondary | ICD-10-CM | POA: Diagnosis present

## 2023-10-23 DIAGNOSIS — I959 Hypotension, unspecified: Secondary | ICD-10-CM | POA: Diagnosis present

## 2023-10-23 DIAGNOSIS — Z886 Allergy status to analgesic agent status: Secondary | ICD-10-CM

## 2023-10-23 DIAGNOSIS — Z803 Family history of malignant neoplasm of breast: Secondary | ICD-10-CM

## 2023-10-23 DIAGNOSIS — Z6836 Body mass index (BMI) 36.0-36.9, adult: Secondary | ICD-10-CM

## 2023-10-23 DIAGNOSIS — N134 Hydroureter: Secondary | ICD-10-CM | POA: Diagnosis present

## 2023-10-23 DIAGNOSIS — Z90710 Acquired absence of both cervix and uterus: Secondary | ICD-10-CM

## 2023-10-23 DIAGNOSIS — D6959 Other secondary thrombocytopenia: Secondary | ICD-10-CM | POA: Diagnosis not present

## 2023-10-23 DIAGNOSIS — Z87891 Personal history of nicotine dependence: Secondary | ICD-10-CM

## 2023-10-23 DIAGNOSIS — R6520 Severe sepsis without septic shock: Secondary | ICD-10-CM | POA: Insufficient documentation

## 2023-10-23 DIAGNOSIS — Z8052 Family history of malignant neoplasm of bladder: Secondary | ICD-10-CM

## 2023-10-23 DIAGNOSIS — Z8262 Family history of osteoporosis: Secondary | ICD-10-CM

## 2023-10-23 DIAGNOSIS — E872 Acidosis, unspecified: Secondary | ICD-10-CM | POA: Diagnosis present

## 2023-10-23 DIAGNOSIS — Z800 Family history of malignant neoplasm of digestive organs: Secondary | ICD-10-CM

## 2023-10-23 DIAGNOSIS — Z807 Family history of other malignant neoplasms of lymphoid, hematopoietic and related tissues: Secondary | ICD-10-CM

## 2023-10-23 HISTORY — PX: CYSTOSCOPY WITH STENT PLACEMENT: SHX5790

## 2023-10-23 LAB — URINALYSIS, ROUTINE W REFLEX MICROSCOPIC
Bilirubin Urine: NEGATIVE
Glucose, UA: NEGATIVE mg/dL
Hgb urine dipstick: NEGATIVE
Ketones, ur: 5 mg/dL — AB
Nitrite: POSITIVE — AB
Protein, ur: NEGATIVE mg/dL
Specific Gravity, Urine: 1.021 (ref 1.005–1.030)
pH: 6 (ref 5.0–8.0)

## 2023-10-23 LAB — CBC WITH DIFFERENTIAL/PLATELET
Abs Immature Granulocytes: 0.07 10*3/uL (ref 0.00–0.07)
Basophils Absolute: 0 10*3/uL (ref 0.0–0.1)
Basophils Relative: 0 %
Eosinophils Absolute: 0 10*3/uL (ref 0.0–0.5)
Eosinophils Relative: 0 %
HCT: 44.3 % (ref 36.0–46.0)
Hemoglobin: 14.6 g/dL (ref 12.0–15.0)
Immature Granulocytes: 1 %
Lymphocytes Relative: 8 %
Lymphs Abs: 1.2 10*3/uL (ref 0.7–4.0)
MCH: 29.4 pg (ref 26.0–34.0)
MCHC: 33 g/dL (ref 30.0–36.0)
MCV: 89.3 fL (ref 80.0–100.0)
Monocytes Absolute: 0.5 10*3/uL (ref 0.1–1.0)
Monocytes Relative: 3 %
Neutro Abs: 12.6 10*3/uL — ABNORMAL HIGH (ref 1.7–7.7)
Neutrophils Relative %: 88 %
Platelets: 315 10*3/uL (ref 150–400)
RBC: 4.96 MIL/uL (ref 3.87–5.11)
RDW: 13.4 % (ref 11.5–15.5)
WBC: 14.4 10*3/uL — ABNORMAL HIGH (ref 4.0–10.5)
nRBC: 0 % (ref 0.0–0.2)

## 2023-10-23 LAB — COMPREHENSIVE METABOLIC PANEL
ALT: 34 U/L (ref 0–44)
AST: 28 U/L (ref 15–41)
Albumin: 4.5 g/dL (ref 3.5–5.0)
Alkaline Phosphatase: 86 U/L (ref 38–126)
Anion gap: 10 (ref 5–15)
BUN: 32 mg/dL — ABNORMAL HIGH (ref 6–20)
CO2: 20 mmol/L — ABNORMAL LOW (ref 22–32)
Calcium: 9.4 mg/dL (ref 8.9–10.3)
Chloride: 104 mmol/L (ref 98–111)
Creatinine, Ser: 0.91 mg/dL (ref 0.44–1.00)
GFR, Estimated: >60 mL/min (ref >60)
Glucose, Bld: 181 mg/dL — ABNORMAL HIGH (ref 70–99)
Potassium: 3.7 mmol/L (ref 3.5–5.1)
Sodium: 134 mmol/L — ABNORMAL LOW (ref 135–145)
Total Bilirubin: 0.5 mg/dL (ref <1.2)
Total Protein: 7.7 g/dL (ref 6.5–8.1)

## 2023-10-23 LAB — I-STAT CG4 LACTIC ACID, ED: Lactic Acid, Venous: 3.8 mmol/L (ref 0.5–1.9)

## 2023-10-23 LAB — LIPASE, BLOOD: Lipase: 31 U/L (ref 11–51)

## 2023-10-23 LAB — MRSA NEXT GEN BY PCR, NASAL: MRSA by PCR Next Gen: NOT DETECTED

## 2023-10-23 LAB — LACTIC ACID, PLASMA: Lactic Acid, Venous: 1.9 mmol/L (ref 0.5–1.9)

## 2023-10-23 LAB — MAGNESIUM: Magnesium: 2.2 mg/dL (ref 1.7–2.4)

## 2023-10-23 SURGERY — CYSTOSCOPY, WITH STENT INSERTION
Anesthesia: General | Laterality: Left

## 2023-10-23 MED ORDER — SODIUM CHLORIDE 0.9 % IR SOLN
Status: DC | PRN
  Administered 2023-10-23: 3000 mL via INTRAVESICAL

## 2023-10-23 MED ORDER — FENTANYL CITRATE PF 50 MCG/ML IJ SOSY: 25.0000 ug | PREFILLED_SYRINGE | INTRAMUSCULAR | Status: DC | PRN

## 2023-10-23 MED ORDER — ACETAMINOPHEN 10 MG/ML IV SOLN
1000.0000 mg | Freq: Four times a day (QID) | INTRAVENOUS | Status: DC
  Administered 2023-10-23: 1000 mg via INTRAVENOUS

## 2023-10-23 MED ORDER — SODIUM CHLORIDE 0.9 % IV SOLN
1.0000 g | INTRAVENOUS | Status: DC
  Administered 2023-10-24 – 2023-10-25 (×2): 1 g via INTRAVENOUS
  Filled 2023-10-23 (×2): qty 10

## 2023-10-23 MED ORDER — NALOXONE HCL 0.4 MG/ML IJ SOLN: 0.4000 mg | INTRAMUSCULAR | Status: DC | PRN

## 2023-10-23 MED ORDER — LACTATED RINGERS IV SOLN: INTRAVENOUS | Status: DC

## 2023-10-23 MED ORDER — ONDANSETRON HCL 4 MG/2ML IJ SOLN
4.0000 mg | Freq: Once | INTRAMUSCULAR | Status: AC
  Administered 2023-10-23: 4 mg via INTRAVENOUS
  Filled 2023-10-23: qty 2

## 2023-10-23 MED ORDER — FENTANYL CITRATE (PF) 100 MCG/2ML IJ SOLN
INTRAMUSCULAR | Status: AC
  Filled 2023-10-23: qty 2

## 2023-10-23 MED ORDER — LACTATED RINGERS IV BOLUS (SEPSIS)
1000.0000 mL | Freq: Once | INTRAVENOUS | Status: AC
  Administered 2023-10-23: 1000 mL via INTRAVENOUS

## 2023-10-23 MED ORDER — KETOROLAC TROMETHAMINE 30 MG/ML IJ SOLN
30.0000 mg | Freq: Once | INTRAMUSCULAR | Status: AC
  Administered 2023-10-23: 30 mg via INTRAVENOUS
  Filled 2023-10-23: qty 1

## 2023-10-23 MED ORDER — KETOROLAC TROMETHAMINE 15 MG/ML IJ SOLN
15.0000 mg | Freq: Four times a day (QID) | INTRAMUSCULAR | Status: AC | PRN
  Administered 2023-10-23 – 2023-10-25 (×4): 15 mg via INTRAVENOUS
  Filled 2023-10-23 (×4): qty 1

## 2023-10-23 MED ORDER — VANCOMYCIN HCL IN DEXTROSE 1-5 GM/200ML-% IV SOLN
INTRAVENOUS | Status: AC
  Filled 2023-10-23: qty 200

## 2023-10-23 MED ORDER — ACETAMINOPHEN 325 MG PO TABS
650.0000 mg | ORAL_TABLET | Freq: Four times a day (QID) | ORAL | Status: DC | PRN
  Administered 2023-10-24 – 2023-10-26 (×5): 650 mg via ORAL
  Filled 2023-10-23 (×5): qty 2

## 2023-10-23 MED ORDER — ORAL CARE MOUTH RINSE: 15.0000 mL | OROMUCOSAL | Status: DC | PRN

## 2023-10-23 MED ORDER — DROPERIDOL 2.5 MG/ML IJ SOLN
1.2500 mg | Freq: Once | INTRAMUSCULAR | Status: AC
  Administered 2023-10-23: 1.25 mg via INTRAVENOUS
  Filled 2023-10-23: qty 2

## 2023-10-23 MED ORDER — FENTANYL CITRATE PF 50 MCG/ML IJ SOSY
50.0000 ug | PREFILLED_SYRINGE | Freq: Once | INTRAMUSCULAR | Status: AC
Start: 2023-10-23 — End: 2023-10-23
  Administered 2023-10-23: 50 ug via INTRAVENOUS

## 2023-10-23 MED ORDER — LACTATED RINGERS IV BOLUS
1000.0000 mL | Freq: Once | INTRAVENOUS | Status: AC
  Administered 2023-10-23: 1000 mL via INTRAVENOUS

## 2023-10-23 MED ORDER — SODIUM CHLORIDE 0.9 % IV SOLN
1.0000 g | Freq: Once | INTRAVENOUS | Status: AC
  Administered 2023-10-23: 1 g via INTRAVENOUS
  Filled 2023-10-23: qty 10

## 2023-10-23 MED ORDER — IOHEXOL 300 MG/ML  SOLN
INTRAMUSCULAR | Status: DC | PRN
  Administered 2023-10-23: 10 mL

## 2023-10-23 MED ORDER — ACETAMINOPHEN 10 MG/ML IV SOLN
INTRAVENOUS | Status: AC
  Filled 2023-10-23: qty 100

## 2023-10-23 MED ORDER — HYDROMORPHONE HCL 1 MG/ML IJ SOLN
1.0000 mg | INTRAMUSCULAR | Status: DC | PRN
  Administered 2023-10-24 – 2023-10-25 (×9): 1 mg via INTRAVENOUS
  Filled 2023-10-23 (×9): qty 1

## 2023-10-23 MED ORDER — ONDANSETRON HCL 4 MG/2ML IJ SOLN
INTRAMUSCULAR | Status: AC
  Filled 2023-10-23: qty 2

## 2023-10-23 MED ORDER — ESMOLOL HCL 100 MG/10ML IV SOLN
INTRAVENOUS | Status: AC
  Filled 2023-10-23: qty 10

## 2023-10-23 MED ORDER — HYDROMORPHONE HCL 1 MG/ML IJ SOLN
1.0000 mg | Freq: Once | INTRAMUSCULAR | Status: AC
  Administered 2023-10-23: 1 mg via INTRAVENOUS
  Filled 2023-10-23: qty 1

## 2023-10-23 MED ORDER — FENTANYL CITRATE PF 50 MCG/ML IJ SOSY
PREFILLED_SYRINGE | INTRAMUSCULAR | Status: AC
  Filled 2023-10-23: qty 1

## 2023-10-23 MED ORDER — LIDOCAINE HCL (PF) 2 % IJ SOLN
INTRAMUSCULAR | Status: AC
  Filled 2023-10-23: qty 5

## 2023-10-23 MED ORDER — KETOROLAC TROMETHAMINE 15 MG/ML IJ SOLN
15.0000 mg | Freq: Once | INTRAMUSCULAR | Status: AC
  Administered 2023-10-23: 15 mg via INTRAVENOUS
  Filled 2023-10-23: qty 1

## 2023-10-23 MED ORDER — SODIUM CHLORIDE 0.9 % IV BOLUS
500.0000 mL | Freq: Once | INTRAVENOUS | Status: AC
  Administered 2023-10-23: 500 mL via INTRAVENOUS

## 2023-10-23 MED ORDER — ROCURONIUM BROMIDE 10 MG/ML (PF) SYRINGE
PREFILLED_SYRINGE | INTRAVENOUS | Status: AC
  Filled 2023-10-23: qty 10

## 2023-10-23 MED ORDER — ACETAMINOPHEN 650 MG RE SUPP: 650.0000 mg | Freq: Four times a day (QID) | RECTAL | Status: DC | PRN

## 2023-10-23 MED ORDER — DEXAMETHASONE SODIUM PHOSPHATE 10 MG/ML IJ SOLN
INTRAMUSCULAR | Status: AC
  Filled 2023-10-23: qty 1

## 2023-10-23 MED ORDER — CHLORHEXIDINE GLUCONATE CLOTH 2 % EX PADS
6.0000 | MEDICATED_PAD | Freq: Every day | CUTANEOUS | Status: DC
  Administered 2023-10-23 – 2023-10-28 (×5): 6 via TOPICAL

## 2023-10-23 MED ORDER — DEXAMETHASONE SODIUM PHOSPHATE 10 MG/ML IJ SOLN
INTRAMUSCULAR | Status: DC | PRN
  Administered 2023-10-23: 10 mg via INTRAVENOUS

## 2023-10-23 MED ORDER — PROPOFOL 10 MG/ML IV BOLUS
INTRAVENOUS | Status: DC | PRN
  Administered 2023-10-23: 120 mg via INTRAVENOUS

## 2023-10-23 MED ORDER — MIDAZOLAM HCL 2 MG/2ML IJ SOLN
INTRAMUSCULAR | Status: AC
  Filled 2023-10-23: qty 2

## 2023-10-23 MED ORDER — LORAZEPAM 2 MG/ML IJ SOLN
0.5000 mg | Freq: Once | INTRAMUSCULAR | Status: AC
  Administered 2023-10-23: 0.5 mg via INTRAVENOUS
  Filled 2023-10-23: qty 1

## 2023-10-23 MED ORDER — SUCCINYLCHOLINE CHLORIDE 200 MG/10ML IV SOSY
PREFILLED_SYRINGE | INTRAVENOUS | Status: AC
  Filled 2023-10-23: qty 10

## 2023-10-23 MED ORDER — SUCCINYLCHOLINE CHLORIDE 200 MG/10ML IV SOSY
PREFILLED_SYRINGE | INTRAVENOUS | Status: DC | PRN
  Administered 2023-10-23: 200 mg via INTRAVENOUS

## 2023-10-23 MED ORDER — ONDANSETRON HCL 4 MG/2ML IJ SOLN
4.0000 mg | Freq: Four times a day (QID) | INTRAMUSCULAR | Status: DC | PRN
  Administered 2023-10-24 – 2023-10-27 (×6): 4 mg via INTRAVENOUS
  Filled 2023-10-23 (×6): qty 2

## 2023-10-23 MED ORDER — ONDANSETRON HCL 4 MG/2ML IJ SOLN
INTRAMUSCULAR | Status: DC | PRN
  Administered 2023-10-23: 4 mg via INTRAVENOUS

## 2023-10-23 MED ORDER — CHLORHEXIDINE GLUCONATE 0.12 % MT SOLN
15.0000 mL | Freq: Once | OROMUCOSAL | Status: AC
  Administered 2023-10-23: 15 mL via OROMUCOSAL

## 2023-10-23 MED ORDER — ROCURONIUM BROMIDE 10 MG/ML (PF) SYRINGE
PREFILLED_SYRINGE | INTRAVENOUS | Status: DC | PRN
  Administered 2023-10-23: 10 mg via INTRAVENOUS
  Administered 2023-10-23: 40 mg via INTRAVENOUS

## 2023-10-23 MED ORDER — PROPOFOL 10 MG/ML IV BOLUS
INTRAVENOUS | Status: AC
  Filled 2023-10-23: qty 20

## 2023-10-23 MED ORDER — MORPHINE SULFATE (PF) 4 MG/ML IV SOLN
4.0000 mg | Freq: Once | INTRAVENOUS | Status: AC
  Administered 2023-10-23: 4 mg via INTRAVENOUS
  Filled 2023-10-23: qty 1

## 2023-10-23 MED ORDER — VANCOMYCIN HCL 1000 MG IV SOLR
INTRAVENOUS | Status: DC | PRN
  Administered 2023-10-23: 1000 mg via INTRAVENOUS

## 2023-10-23 MED ORDER — LIDOCAINE 2% (20 MG/ML) 5 ML SYRINGE
INTRAMUSCULAR | Status: DC | PRN
  Administered 2023-10-23: 100 mg via INTRAVENOUS

## 2023-10-23 MED ORDER — SUGAMMADEX SODIUM 200 MG/2ML IV SOLN
INTRAVENOUS | Status: DC | PRN
  Administered 2023-10-23: 350 mg via INTRAVENOUS

## 2023-10-23 MED ORDER — PHENYLEPHRINE 80 MCG/ML (10ML) SYRINGE FOR IV PUSH (FOR BLOOD PRESSURE SUPPORT)
PREFILLED_SYRINGE | INTRAVENOUS | Status: DC | PRN
  Administered 2023-10-23: 240 ug via INTRAVENOUS

## 2023-10-23 SURGICAL SUPPLY — 14 items
BAG URO CATCHER STRL LF (MISCELLANEOUS) ×1 IMPLANT
CATH FOLEY 2WAY SLVR 5CC 18FR (CATHETERS) IMPLANT
CATH URETL OPEN 5X70 (CATHETERS) ×1 IMPLANT
CLOTH BEACON ORANGE TIMEOUT ST (SAFETY) ×1 IMPLANT
GLOVE SURG LX STRL 8.0 MICRO (GLOVE) ×1 IMPLANT
GOWN STRL REUS W/ TWL XL LVL3 (GOWN DISPOSABLE) ×1 IMPLANT
GOWN STRL REUS W/TWL XL LVL3 (GOWN DISPOSABLE) ×1
GUIDEWIRE STR DUAL SENSOR (WIRE) ×1 IMPLANT
KIT TURNOVER KIT A (KITS) IMPLANT
MANIFOLD NEPTUNE II (INSTRUMENTS) ×1 IMPLANT
PACK CYSTO (CUSTOM PROCEDURE TRAY) ×1 IMPLANT
PAD PREP 24X48 CUFFED NSTRL (MISCELLANEOUS) ×1 IMPLANT
STENT URET 6FRX24 CONTOUR (STENTS) IMPLANT
TUBING CONNECTING 10 (TUBING) IMPLANT

## 2023-10-23 NOTE — ED Provider Notes (Signed)
[place] EMERGENCY DEPARTMENT AT [facility] Provider Note   CSN: [id number] Arrival date & time: 10/23/23  0055     History  Chief Complaint  Patient presents with   Flank Pain    Patricia Erickson is a 52 y.o. female.  52 year old female with past medical history of kidney stones presents with severe left flank pain onset around 8 PM yesterday.  Associated with nausea, no vomiting.  Took Percocet at home but was unable to get comfortable which prompted her to come to emergency room today.  Denies changes in bowel or bladder habits (notes baseline urinary frequency).  Prior abdominal surgeries include cholecystectomy and hysterectomy as well as prior lithotripsy with stent.  [facility]       Home Medications Prior to Admission medications   Medication Sig Start Date End Date Taking? Authorizing Provider  amoxicillin-clavulanate (AUGMENTIN) 875-125 MG tablet Take 1 tablet by mouth every 12 (twelve) hours. 06/03/23   Kommor, Madison, MD  Bacillus Coagulans-Inulin (PROBIOTIC-PREBIOTIC PO) Take 3.6 g by mouth daily.    [provider]  fluconazole (DIFLUCAN) 150 MG tablet Take 1 tablet (150 mg total) by mouth once as needed for up to 1 dose (after finishing abx). 06/03/23   Kommor, Madison, MD  levonorgestrel (MIRENA, 52 MG,) 20 MCG/DAY IUD Mirena 21 mcg/24 hours (8 yrs) 52 mg intrauterine device  Take by intrauterine route. 12/09/17   [provider]  Meth-Hyo-M Bl-Na Phos-Ph Sal (URO-MP) 118 MG CAPS Take 1 capsule by mouth 3 (three) times daily as needed.    [provider]  naproxen (NAPROSYN) 375 MG tablet Take 1 tablet (375 mg total) by mouth 2 (two) times daily. 06/03/23   Kommor, Madison, MD  ondansetron (ZOFRAN-ODT) 4 MG disintegrating tablet Take 1 tablet (4 mg total) by mouth every 8 (eight) hours as needed for nausea or vomiting. 06/03/23   Kommor, Madison, MD  topiramate (TOPAMAX) 200 MG tablet Take 300 mg by  mouth at bedtime.     [provider]  prochlorperazine (COMPAZINE) 10 MG tablet Take 1 tablet (10 mg total) by mouth 2 (two) times daily as needed for nausea or vomiting. 07/16/18 08/26/19  Tegeler, Canary Brim, MD      Allergies    Other and Propoxyphene n-acetaminophen    Review of Systems   Review of Systems Negative except as per HPI Physical Exam Updated Vital Signs BP (!) 163/89 (BP Location: Right Arm)   Pulse (!) 107   Temp 98.9 F (37.2 C) (Oral)   Resp 20   Ht 5\' 6"  (1.676 m)   Wt 89.4 kg   SpO2 100%   BMI 31.80 kg/m  Physical Exam Vitals and nursing note reviewed.  Constitutional:      General: She is not in acute distress.    Appearance: She is well-developed. She is not diaphoretic.  HENT:     Head: Normocephalic and atraumatic.  Cardiovascular:     Pulses: Normal pulses.  Pulmonary:     Effort: Pulmonary effort is normal.     Breath sounds: Normal breath sounds.  Abdominal:     Palpations: Abdomen is soft.     Tenderness: There is no abdominal tenderness. There is left CVA tenderness. There is no right CVA tenderness.  Musculoskeletal:     Right lower leg: No edema.     Left lower leg: No edema.  Skin:    General: Skin is warm and dry.  Findings: No erythema or rash.  Neurological:     Mental Status: She is alert and oriented to person, place, and time.  Psychiatric:        Behavior: Behavior normal.     ED Results / Procedures / Treatments   Labs (all labs ordered are listed, but only abnormal results are displayed) Labs Reviewed  CBC WITH DIFFERENTIAL/PLATELET - Abnormal; Notable for the following components:      Result Value   WBC 14.4 (*)    Neutro Abs 12.6 (*)    All other components within normal limits  COMPREHENSIVE METABOLIC PANEL - Abnormal; Notable for the following components:   Sodium 134 (*)    CO2 20 (*)    Glucose, Bld 181 (*)    BUN 32 (*)    All other components within normal limits  URINALYSIS, ROUTINE W  REFLEX MICROSCOPIC - Abnormal; Notable for the following components:   APPearance HAZY (*)    Ketones, ur 5 (*)    Nitrite POSITIVE (*)    Leukocytes,Ua SMALL (*)    Bacteria, UA MANY (*)    All other components within normal limits  URINE CULTURE  LIPASE, BLOOD    EKG None  Radiology CT Renal Stone Study  Result Date: 10/23/2023 CLINICAL DATA:  Left flank pain. EXAM: CT ABDOMEN AND PELVIS WITHOUT CONTRAST TECHNIQUE: Multidetector CT imaging of the abdomen and pelvis was performed following the standard protocol without IV contrast. RADIATION DOSE REDUCTION: This exam was performed according to the departmental dose-optimization program which includes automated exposure control, adjustment of the mA and/or kV according to patient size and/or use of iterative reconstruction technique. COMPARISON:  June 03, 2023 FINDINGS: Lower chest: Mild atelectasis is seen within the posterior aspects of the bilateral lung bases. Hepatobiliary: No focal liver abnormality is seen. Status post cholecystectomy. No biliary dilatation. Pancreas: Unremarkable. No pancreatic ductal dilatation or surrounding inflammatory changes. Spleen: Normal in size without focal abnormality. Adrenals/Urinary Tract: Adrenal glands are unremarkable. There is mild diffuse enlargement of the left kidney without evidence of focal lesions. 2 mm and 3 mm nonobstructing renal calculi are seen within the left kidney. A 5 mm obstructing renal calculus is seen within the mid to distal left ureter, with mild left-sided hydronephrosis and hydroureter. Mild left perinephric inflammatory fat stranding is also seen. Bladder is unremarkable. Stomach/Bowel: Stomach is within normal limits. Appendix appears normal. No evidence of bowel wall thickening, distention, or inflammatory changes. Noninflamed diverticula are seen throughout the descending and sigmoid colon. Vascular/Lymphatic: Aortic atherosclerosis. No enlarged abdominal or pelvic lymph nodes.  Reproductive: A properly positioned IUD is seen within an otherwise normal appearing uterus. The bilateral adnexa are unremarkable. Other: No abdominal wall hernia or abnormality. No abdominopelvic ascites. Musculoskeletal: No acute or significant osseous findings. IMPRESSION: 1. 5 mm obstructing renal calculus within the mid to distal left ureter. 2. 2 mm and 3 mm nonobstructing left renal calculi. 3. Colonic diverticulosis. 4. Evidence of prior cholecystectomy. 5. Aortic atherosclerosis. Aortic Atherosclerosis (ICD10-I70.0). Electronically Signed   By: Aram Candela M.D.   On: 10/23/2023 02:40    Procedures Procedures    Medications Ordered in ED Medications  ondansetron (ZOFRAN) injection 4 mg (4 mg Intravenous Given 10/23/23 0126)  HYDROmorphone (DILAUDID) injection 1 mg (1 mg Intravenous Given 10/23/23 0127)  sodium chloride 0.9 % bolus 500 mL (0 mLs Intravenous Stopped 10/23/23 0416)  morphine (PF) 4 MG/ML injection 4 mg (4 mg Intravenous Given 10/23/23 0247)  ondansetron (ZOFRAN) injection 4 mg (  4 mg Intravenous Given 10/23/23 0246)  ketorolac (TORADOL) 15 MG/ML injection 15 mg (15 mg Intravenous Given 10/23/23 0255)  droperidol (INAPSINE) 2.5 MG/ML injection 1.25 mg (1.25 mg Intravenous Given 10/23/23 0454)  LORazepam (ATIVAN) injection 0.5 mg (0.5 mg Intravenous Given 10/23/23 0455)  cefTRIAXone (ROCEPHIN) 1 g in sodium chloride 0.9 % 100 mL IVPB (1 g Intravenous New Bag/Given 10/23/23 0455)    ED Course/ Medical Decision Making/ A&P                                 Medical Decision Making Amount and/or Complexity of Data Reviewed Labs: ordered. Radiology: ordered.  Risk Prescription drug management. Decision regarding hospitalization.   This patient presents to the ED for concern of left flank pain, this involves an extensive number of treatment options, and is a complaint that carries with it a high risk of complications and morbidity.  The differential diagnosis  includes but not limited to diverticulitis, perf, mass, stone   Co morbidities that complicate the patient evaluation  Kidney stones, migraines    Additional history obtained:  External records from outside source obtained and reviewed including prior labs on file for comparison   Lab Tests:  I Ordered, and personally interpreted labs.  The pertinent results include:  CBC with mild leukocytosis, at 14.4 with increase in neutrophils. BMP with normal Cr. UA with nitrited, leukocytes, many bacteria and 21-50 WBC.   Imaging Studies ordered:  I ordered imaging studies including CT stone study  I independently visualized and interpreted imaging which showed left ureteral stone I agree with the radiologist interpretation   Consultations Obtained:  I requested consultation with the Er attending, Dr. Posey Rea,  and discussed lab and imaging findings as well as pertinent plan - they recommend: droperidol, ativan. Consult urology Case discussed with Dr. Pete Glatter with urology, recommends admit to hospitalist, NPO, will see patient in the morning.  Case discussed with Dr. Arlean Hopping with [facility] who will consult for admission.    Problem List / ED Course / Critical interventions / Medication management  52 year old female with history of prior kidney stone (treated with lithotripsy and stent in 2021) presents with sharp left flank pain onset 8pm last night with vomiting. On exam, left CVA tenderness, appears uncomfortable. Provided with dilaudid and Zofran while awaiting work up. Found to have 5mm mid ureteral stone on left, with concern for infection given WBC 14 with leukocytes and nitrites in urine. Pain and vomiting difficult to control. Discussed with urology, requests hospitalist to admit, urology will see. Provided with Rocephin, additional pain meds/antiemetics.  I ordered medication including dilaudid, morphine, Toradol, Zofran  for pain and vomiting  Reevaluation of  the patient after these medicines showed that the patient stayed the same Add on droperidol and ativan for pain and vomiting not controlled with above medications  I have reviewed the patients home medicines and have made adjustments as needed   Social Determinants of Health:  Lives with family. Sees Dr. Arita Miss with [facility]    Test / Admission - Considered:  admit         Final Clinical Impression(s) / ED Diagnoses Final diagnoses:  Ureterolithiasis  Urinary tract infection in female    Rx / DC Orders ED Discharge Orders     None         Jeannie Fend, PA-C 10/23/23 1914    Glendora Score, MD 10/23/23  2310  

## 2023-10-23 NOTE — H&P (Signed)
History and Physical    Patient: Patricia Erickson DOB: 1971-11-01 DOA: 10/23/2023 DOS: the patient was seen and examined on 10/23/2023 PCP: Marianne Sofia, PA-C  Patient coming from: Home  Chief Complaint:  Chief Complaint  Patient presents with   Flank Pain   HPI: Patricia Erickson is a 52 y.o. female with medical history significant of migraine headaches, COVID-19, colon polyps, diverticulosis, history of diverticulitis, cholelithiasis, intermittent palpitations, class I obesity, cervical and lumbar radiculopathy, lower back pain, hemorrhoids, history of nephrolithiasis who presented to the emergency department with left flank pain since around 2030 yesterday evening associated with nausea and multiple episodes of emesis.  The patient has also been febrile in the emergency department.  No rhinorrhea, sore throat, wheezing or hemoptysis.  No chest pain, palpitations, diaphoresis, PND, orthopnea or pitting edema of the lower extremities.  No diarrhea, constipation, melena or hematochezia. No dysuria, frequency or hematuria.  No polyuria, polydipsia, polyphagia or blurred vision.   Lab work: Urine analysis was hazy with ketones of 5 mg/dL, positive nitrites, small leukocyte esterase, 21-50 WBC and many bacteria.  CBC showed a white count of 14.4 with 88% neutrophils, hemoglobin 14.6 g/dL platelets 811.  Lipase was normal.  Lactic acid 3.8 mmol/L.  CMP showed a CO2 of 20 mmol/L, glucose 181 and BUN 32 mg/dL.  The rest of the CMP measurements were normal after sodium correction.  Imaging: CT renal stone study showed a 5 mm obstructing renal calculus within the mid to distal left ureter. 2 mm and 3 mm nonobstructing left renal calculi.  There is mild left-sided hydronephrosis and hydroureter.  Mild left perinephric inflammatory fat stranding also seen.  Bladder is unremarkable.  Colonic diverticulosis.  Prior cholecystectomy.  Aortic atherosclerosis.   ED course: Initial vital signs were  temperature 98.1 F, pulse 69, respirations 20, BP 155/98 mmHg O2 sat 100% on room air.  Her weight is 89.4 kg with a BMI of 31.80 kg/m.  She did receive ondansetron 4 mg IVP x 2, lorazepam 0.5 mg IVP x 2, morphine 4 mg IVP x 1 ketorolac 15 mg IVP x 1, droperidol 1.25 mg IVP x 1 ceftriaxone 1 g IVP x 1 and 500 mL of normal saline bolus.  Review of Systems: As mentioned in the history of present illness. All other systems reviewed and are negative. Past Medical History:  Diagnosis Date   COVID    History of kidney stones    Migraines    hz of migraines    Polyp of colon 03/11/2007   BENIGN   Past Surgical History:  Procedure Laterality Date   CESAREAN SECTION  12/09/1998   CHOLECYSTECTOMY N/A 10/18/2015   Procedure: LAPAROSCOPIC CHOLECYSTECTOMY;  Surgeon: Rodman Pickle, MD;  Location: [facility] OR;  Service: General;  Laterality: N/A;   CYSTOSCOPY/URETEROSCOPY/HOLMIUM LASER/STENT PLACEMENT Left 07/31/2020   Procedure: CYSTOSCOPY/URETEROSCOPY/HOLMIUM LASER/STENT PLACEMENT;  Surgeon: Noel Christmas, MD;  Location: [facility] ORS;  Service: Urology;  Laterality: Left;  90 MINS   LAPAROSCOPIC CHOLECYSTECTOMY  10/18/2015   Social History:  reports that she quit smoking about 31 years ago. Her smoking use included cigarettes. She started smoking about 38 years ago. She has a 3.5 pack-year smoking history. She has never used smokeless tobacco. She reports that she does not currently use alcohol. She reports that she does not use drugs.  Allergies  Allergen Reactions   Propoxyphene N-Acetaminophen Nausea And Vomiting    Family History  Problem Relation Age of Onset   Osteoporosis Mother  Breast cancer Mother 33   Colon cancer Mother 77       removed large intestine   Lymphoma Mother 77   Hypertension Father    Osteoporosis Maternal Grandmother    Lymphoma Maternal Grandmother 41   Colon polyps Sister        "several" dx. 104 or younger   Other Sister        hysterectomy at 24; suspicious  breast finding removed at 22   Colon cancer Maternal Aunt 43   Lymphoma Maternal Uncle 63   Lung cancer Maternal Grandfather 66       metastatic to bone; former smoker   Throat cancer Paternal Grandmother 8       not a smoker   Heart Problems Paternal Grandfather    Other Other        suspicious breast finding at 40 - recent follow-up normal   Heart Problems Maternal Uncle    Bladder Cancer Paternal Uncle 48   Throat cancer Paternal Uncle        dx. before age 43    Prior to Admission medications   Medication Sig Start Date End Date Taking? Authorizing Provider  amoxicillin-clavulanate (AUGMENTIN) 875-125 MG tablet Take 1 tablet by mouth every 12 (twelve) hours. 06/03/23   Kommor, Madison, MD  Bacillus Coagulans-Inulin (PROBIOTIC-PREBIOTIC PO) Take 3.6 g by mouth daily.    [provider]  fluconazole (DIFLUCAN) 150 MG tablet Take 1 tablet (150 mg total) by mouth once as needed for up to 1 dose (after finishing abx). 06/03/23   Kommor, Madison, MD  levonorgestrel (MIRENA, 52 MG,) 20 MCG/DAY IUD Mirena 21 mcg/24 hours (8 yrs) 52 mg intrauterine device  Take by intrauterine route. 12/09/17   [provider]  Meth-Hyo-M Bl-Na Phos-Ph Sal (URO-MP) 118 MG CAPS Take 1 capsule by mouth 3 (three) times daily as needed.    [provider]  naproxen (NAPROSYN) 375 MG tablet Take 1 tablet (375 mg total) by mouth 2 (two) times daily. 06/03/23   Kommor, Madison, MD  ondansetron (ZOFRAN-ODT) 4 MG disintegrating tablet Take 1 tablet (4 mg total) by mouth every 8 (eight) hours as needed for nausea or vomiting. 06/03/23   Kommor, Madison, MD  topiramate (TOPAMAX) 200 MG tablet Take 300 mg by mouth at bedtime.     [provider]  prochlorperazine (COMPAZINE) 10 MG tablet Take 1 tablet (10 mg total) by mouth 2 (two) times daily as needed for nausea or vomiting. 07/16/18 08/26/19  Tegeler, Canary Brim, MD    Physical Exam: Vitals:   10/23/23 0600 10/23/23 0610 10/23/23 0630  10/23/23 0710  BP: (!) 174/129  (!) 159/90 137/71  Pulse: (!) 147 (!) 122 (!) 122 (!) 121  Resp: (!) 21 15 17 16   Temp:    (!) 101.3 F (38.5 C)  TempSrc:      SpO2: 99% 97% 94% 92%  Weight:      Height:       Physical Exam Vitals reviewed.  Constitutional:      General: She is awake. She is not in acute distress.    Appearance: She is obese. She is ill-appearing.  HENT:     Head: Normocephalic.     Nose: No rhinorrhea.     Mouth/Throat:     Mouth: Mucous membranes are dry.  Eyes:     General: No scleral icterus.    Pupils: Pupils are equal, round, and reactive to light.  Neck:     Vascular: No  JVD.  Cardiovascular:     Rate and Rhythm: Regular rhythm. Tachycardia present.     Heart sounds: S1 normal and S2 normal.  Pulmonary:     Effort: Pulmonary effort is normal.     Breath sounds: Normal breath sounds.  Abdominal:     General: Bowel sounds are normal.     Palpations: Abdomen is soft.  Musculoskeletal:     Cervical back: Neck supple.     Right lower leg: No edema.     Left lower leg: No edema.  Skin:    General: Skin is warm and dry.  Neurological:     General: No focal deficit present.     Mental Status: She is alert and oriented to person, place, and time.  Psychiatric:        Mood and Affect: Mood normal.        Behavior: Behavior normal. Behavior is cooperative.     Data Reviewed:  Results are pending, will review when available.  EKG: Vent. rate 125 BPM PR interval 169 ms QRS duration 96 ms QT/QTcB 296/427 ms P-R-T axes 72 49 59 Sinus tachycardia RSR' in V1 or V2, right VCD or RVH  Assessment and Plan: Principal Problem:   Sepsis secondary to UTI (HCC) In the setting of:   Left ureteral stone With superimposed:   Hydronephrosis of left kidney   Hydroureter on left Observation/PCU. Continue IV fluids. Keep n.p.o. for now. -Scheduled for stone removal/urinary stent placement. Analgesics as needed. Antiemetics as needed. Continue  ceftriaxone 1 g IVPB daily. Follow CBC, CMP in AM. Urology input appreciated.  Active Problems:   Hyperglycemia Check fasting glucose tomorrow morning. Check hemoglobin A1c level if elevated.    Aortic atherosclerosis (HCC) Not sure if she has hyperlipidemia. Should follow-up with PCP for fasting lipids. Would benefit from statin therapy.    History of migraine headaches  No headache at this time. Continue topiramate 300 mg p.o. at bedtime for prophylaxis.    Obesity, Class I, BMI 30-34.9 Current BMI 31.80 kg/m. Lifestyle modifications. Follow-up with closely PCP.   Advance Care Planning:   Code Status: Full Code   Consults: Urology Di Kindle, MD).  Family Communication:   Severity of Illness: The appropriate patient status for this patient is OBSERVATION. Observation status is judged to be reasonable and necessary in order to provide the required intensity of service to ensure the patient's safety. The patient's presenting symptoms, physical exam findings, and initial radiographic and laboratory data in the context of their medical condition is felt to place them at decreased risk for further clinical deterioration. Furthermore, it is anticipated that the patient will be medically stable for discharge from the hospital within 2 midnights of admission.   Author: Bobette Mo, MD 10/23/2023 8:00 AM  For on call review [email or web address].   This document was prepared using Dragon voice recognition software and may contain some unintended transcription errors.

## 2023-10-23 NOTE — Transfer of Care (Signed)
Immediate Anesthesia Transfer of Care Note  Patient: Patricia Erickson  Procedure(s) Performed: CYSTOSCOPY WITH RETROGRADE PYELOGRAM STENT PLACEMENT (Left)  Patient Location: PACU  Anesthesia Type:General  Level of Consciousness: drowsy  Airway & Oxygen Therapy: Patient Spontanous Breathing and Patient connected to face mask oxygen  Post-op Assessment: Report given to RN and Post -op Vital signs reviewed and stable  Post vital signs: Reviewed and stable  Last Vitals:  Vitals Value Taken Time  BP 134/67 10/23/23 1052  Temp    Pulse 117 10/23/23 1055  Resp 16 10/23/23 1055  SpO2 100 % 10/23/23 1055  Vitals shown include unfiled device data.  Last Pain:  Vitals:   10/23/23 0901  TempSrc: Oral  PainSc: Asleep         Complications: No notable events documented.

## 2023-10-23 NOTE — ED Notes (Signed)
Kommor, MD notified of pt HR spiking to 138-144. Kommor, MD went to bedside to assess pt and orders placed.

## 2023-10-23 NOTE — Progress Notes (Signed)
[facility] admitting physician addendum:  The patient has continued to have decreasing her blood pressure despite getting a liter of LR bolus earlier this afternoon.  She is also febrile again and then still tachycardic.  However, she stated that her pain is better now and there was no significant discomfort on palpation this afternoon. Given her lactic acidosis, hypotension and persistent fever will transfer to the SDU at least for this evening for closer monitoring.  Another liter of LR has been ordered along with maintenance fluids.  We will check her lactic acid after current bolus is infused.  Sanda Klein, MD.

## 2023-10-23 NOTE — Consult Note (Signed)
Urology Consult Note   Requesting Attending Physician:  Bobette Mo, MD Service Providing Consult: Urology  Consulting Attending: Dr. Pete Glatter   Reason for Consult:    HPI: Patricia Erickson is seen in consultation for reasons noted above at the request of Bobette Mo, MD. Patient is a 52 year old female known to our practice, followed by Dr. Arita Miss.  She carries a history of nephrolithiasis and has had E. coli UTIs with varying levels of resistance.  She reports about 830 last night she began having significant flank pain identifiable as an obstructive stone.  Over the course of the evening in the emergency department she has been tachycardic, febrile-Tmax 101.3, with stable blood pressure.  She has lactic acidosis, nitrite positive urinalysis suggestive of infection.  CT A/P shows obstructing the left mid to distal ureteral stone.  On assessment in the emergency department she was quite sedate, having just received pain medicine.  She was accompanied by her son.  ------------------  Assessment:  52 y.o. female with obstructing ureteral stone.   Recommendations: # Sepsis # Ureteral stone  To the OR with Dr. Pete Glatter for ureteral stent.  Both previous ureteral cultures were positive for E. coli, sensitive to ceftriaxone she is receiving.  Will tailor to culture data.  ABX per primary.  She is hemodynamically stable at this point and reports that her rigors have subsided since receiving antibiotics.  I have left my cell phone number with the nurse and charge nurse with instruction to call me immediately if there is any decline in her stability.  Case and plan discussed with Dr. Pete Glatter  Past Medical History: Past Medical History:  Diagnosis Date   COVID    History of kidney stones    Migraines    hz of migraines    Polyp of colon 03/11/2007   BENIGN    Past Surgical History:  Past Surgical History:  Procedure Laterality Date   CESAREAN SECTION   12/09/1998   CHOLECYSTECTOMY N/A 10/18/2015   Procedure: LAPAROSCOPIC CHOLECYSTECTOMY;  Surgeon: Rodman Pickle, MD;  Location: [facility] OR;  Service: General;  Laterality: N/A;   CYSTOSCOPY/URETEROSCOPY/HOLMIUM LASER/STENT PLACEMENT Left 07/31/2020   Procedure: CYSTOSCOPY/URETEROSCOPY/HOLMIUM LASER/STENT PLACEMENT;  Surgeon: Noel Christmas, MD;  Location: [facility] ORS;  Service: Urology;  Laterality: Left;  90 MINS   LAPAROSCOPIC CHOLECYSTECTOMY  10/18/2015    Medication: Current Facility-Administered Medications  Medication Dose Route Frequency Provider Last Rate Last Admin   acetaminophen (TYLENOL) tablet 650 mg  650 mg Oral Q6H PRN Howerter, Justin B, DO       Or   acetaminophen (TYLENOL) suppository 650 mg  650 mg Rectal Q6H PRN Howerter, Justin B, DO       [START ON 10/24/2023] cefTRIAXone (ROCEPHIN) 1 g in sodium chloride 0.9 % 100 mL IVPB  1 g Intravenous Q24H Howerter, Justin B, DO       HYDROmorphone (DILAUDID) injection 1 mg  1 mg Intravenous Q2H PRN Howerter, Justin B, DO       ketorolac (TORADOL) 15 MG/ML injection 15 mg  15 mg Intravenous Q6H PRN Howerter, Justin B, DO       lactated ringers infusion   Intravenous Continuous Howerter, Justin B, DO 125 mL/hr at 10/23/23 0542 New Bag at 10/23/23 0542   naloxone (NARCAN) injection 0.4 mg  0.4 mg Intravenous PRN Howerter, Justin B, DO       ondansetron (ZOFRAN) injection 4 mg  4 mg Intravenous Q6H PRN Howerter, Justin B, DO  Current Outpatient Medications  Medication Sig Dispense Refill   amoxicillin-clavulanate (AUGMENTIN) 875-125 MG tablet Take 1 tablet by mouth every 12 (twelve) hours. 14 tablet 0   Bacillus Coagulans-Inulin (PROBIOTIC-PREBIOTIC PO) Take 3.6 g by mouth daily.     fluconazole (DIFLUCAN) 150 MG tablet Take 1 tablet (150 mg total) by mouth once as needed for up to 1 dose (after finishing abx). 1 tablet 0   levonorgestrel (MIRENA, 52 MG,) 20 MCG/DAY IUD Mirena 21 mcg/24 hours (8 yrs) 52 mg intrauterine device   Take by intrauterine route.     Meth-Hyo-M Bl-Na Phos-Ph Sal (URO-MP) 118 MG CAPS Take 1 capsule by mouth 3 (three) times daily as needed.     naproxen (NAPROSYN) 375 MG tablet Take 1 tablet (375 mg total) by mouth 2 (two) times daily. 20 tablet 0   ondansetron (ZOFRAN-ODT) 4 MG disintegrating tablet Take 1 tablet (4 mg total) by mouth every 8 (eight) hours as needed for nausea or vomiting. 20 tablet 0   topiramate (TOPAMAX) 200 MG tablet Take 300 mg by mouth at bedtime.       Allergies: Allergies  Allergen Reactions   Propoxyphene N-Acetaminophen Nausea And Vomiting    Social History: Social History   Tobacco Use   Smoking status: Former    Current packs/day: 0.00    Average packs/day: 0.5 packs/day for 7.0 years (3.5 ttl pk-yrs)    Types: Cigarettes    Start date: 10/12/1985    Quit date: 10/12/1992    Years since quitting: 31.0   Smokeless tobacco: Never  Vaping Use   Vaping status: Never Used  Substance Use Topics   Alcohol use: Not Currently   Drug use: No    Family History Family History  Problem Relation Age of Onset   Osteoporosis Mother    Breast cancer Mother 49   Colon cancer Mother 6       removed large intestine   Lymphoma Mother 67   Hypertension Father    Osteoporosis Maternal Grandmother    Lymphoma Maternal Grandmother 82   Colon polyps Sister        "several" dx. 101 or younger   Other Sister        hysterectomy at 59; suspicious breast finding removed at 24   Colon cancer Maternal Aunt 43   Lymphoma Maternal Uncle 63   Lung cancer Maternal Grandfather 66       metastatic to bone; former smoker   Throat cancer Paternal Grandmother 86       not a smoker   Heart Problems Paternal Grandfather    Other Other        suspicious breast finding at 88 - recent follow-up normal   Heart Problems Maternal Uncle    Bladder Cancer Paternal Uncle 29   Throat cancer Paternal Uncle        dx. before age 24    Review of Systems  Genitourinary:  Positive  for flank pain.     Objective   Vital signs in last 24 hours: BP 137/71   Pulse (!) 121   Temp (!) 101.3 F (38.5 C)   Resp 16   Ht 5\' 6"  (1.676 m)   Wt 89.4 kg   SpO2 92%   BMI 31.80 kg/m   Physical Exam General: sedated, resting, appropriate HEENT: [place]/AT Pulmonary: Normal work of breathing Cardiovascular: tachycardia, no cyanosis Abdomen: Soft, NTTP, nondistended   Most Recent Labs: Lab Results  Component Value Date   WBC 14.4 (H)  10/23/2023   HGB 14.6 10/23/2023   HCT 44.3 10/23/2023   PLT 315 10/23/2023    Lab Results  Component Value Date   NA 134 (L) 10/23/2023   K 3.7 10/23/2023   CL 104 10/23/2023   CO2 20 (L) 10/23/2023   BUN 32 (H) 10/23/2023   CREATININE 0.91 10/23/2023   CALCIUM 9.4 10/23/2023   MG 2.2 10/23/2023    No results found for: "INR", "APTT"   Urine Culture: @[id number] (laburin,org,[id number])@   IMAGING: CT Renal Stone Study  Result Date: 10/23/2023 CLINICAL DATA:  Left flank pain. EXAM: CT ABDOMEN AND PELVIS WITHOUT CONTRAST TECHNIQUE: Multidetector CT imaging of the abdomen and pelvis was performed following the standard protocol without IV contrast. RADIATION DOSE REDUCTION: This exam was performed according to the departmental dose-optimization program which includes automated exposure control, adjustment of the mA and/or kV according to patient size and/or use of iterative reconstruction technique. COMPARISON:  June 03, 2023 FINDINGS: Lower chest: Mild atelectasis is seen within the posterior aspects of the bilateral lung bases. Hepatobiliary: No focal liver abnormality is seen. Status post cholecystectomy. No biliary dilatation. Pancreas: Unremarkable. No pancreatic ductal dilatation or surrounding inflammatory changes. Spleen: Normal in size without focal abnormality. Adrenals/Urinary Tract: Adrenal glands are unremarkable. There is mild diffuse enlargement of the left kidney without evidence of focal lesions. 2 mm and 3 mm  nonobstructing renal calculi are seen within the left kidney. A 5 mm obstructing renal calculus is seen within the mid to distal left ureter, with mild left-sided hydronephrosis and hydroureter. Mild left perinephric inflammatory fat stranding is also seen. Bladder is unremarkable. Stomach/Bowel: Stomach is within normal limits. Appendix appears normal. No evidence of bowel wall thickening, distention, or inflammatory changes. Noninflamed diverticula are seen throughout the descending and sigmoid colon. Vascular/Lymphatic: Aortic atherosclerosis. No enlarged abdominal or pelvic lymph nodes. Reproductive: A properly positioned IUD is seen within an otherwise normal appearing uterus. The bilateral adnexa are unremarkable. Other: No abdominal wall hernia or abnormality. No abdominopelvic ascites. Musculoskeletal: No acute or significant osseous findings. IMPRESSION: 1. 5 mm obstructing renal calculus within the mid to distal left ureter. 2. 2 mm and 3 mm nonobstructing left renal calculi. 3. Colonic diverticulosis. 4. Evidence of prior cholecystectomy. 5. Aortic atherosclerosis. Aortic Atherosclerosis (ICD10-I70.0). Electronically Signed   By: Aram Candela M.D.   On: 10/23/2023 02:40    ------  Elmon Kirschner, NP Pager: [phone]   Please contact the urology consult pager with any further questions/concerns.

## 2023-10-23 NOTE — Consult Note (Signed)
History of present illness: 25 F w/ L obstructive pyelonephritis. Currently having rigors and fevers.    Review of systems: A 12 point comprehensive review of systems was obtained and is negative unless otherwise stated in the history of present illness.  Patient Active Problem List   Diagnosis Date Noted   Left ureteral stone 10/23/2023   Sepsis secondary to UTI [facility]) 10/23/2023   Hydronephrosis of left kidney 10/23/2023   Hydroureter on left 10/23/2023   Hyperglycemia 10/23/2023   Aortic atherosclerosis (HCC) 10/23/2023   History of migraine headaches 10/23/2023   Diverticulitis 06/04/2023   Acute cystitis without hematuria 06/04/2023   Low back pain 03/07/2022   Lumbar radiculopathy 02/21/2022   Cervical radiculopathy 02/21/2022   Vaginal discharge 03/19/2017   Vaginal burning 03/19/2017   Obesity, Class I, BMI 30-34.9 03/03/2017   Flu-like symptoms 01/02/2017   Genetic testing 12/18/2015   Gallstones 10/17/2015   Symptomatic cholelithiasis 10/17/2015   Neck pain 07/05/2015   Healthcare maintenance 07/05/2015   Intermittent palpitations 07/05/2015   Family history of colon cancer 10/12/2012   Family history of breast cancer in female 10/12/2012   Migraine headache 01/19/2009   Hemorrhoids 01/10/2009   History of colonic polyps 01/10/2009    No current facility-administered medications on file prior to encounter.   Current Outpatient Medications on File Prior to Encounter  Medication Sig Dispense Refill   amoxicillin-clavulanate (AUGMENTIN) 875-125 MG tablet Take 1 tablet by mouth every 12 (twelve) hours. 14 tablet 0   Bacillus Coagulans-Inulin (PROBIOTIC-PREBIOTIC PO) Take 3.6 g by mouth daily.     fluconazole (DIFLUCAN) 150 MG tablet Take 1 tablet (150 mg total) by mouth once as needed for up to 1 dose (after finishing abx). 1 tablet 0   levonorgestrel (MIRENA, 52 MG,) 20 MCG/DAY IUD Mirena 21 mcg/24 hours (8 yrs) 52 mg intrauterine device  Take by intrauterine  route.     Meth-Hyo-M Bl-Na Phos-Ph Sal (URO-MP) 118 MG CAPS Take 1 capsule by mouth 3 (three) times daily as needed.     naproxen (NAPROSYN) 375 MG tablet Take 1 tablet (375 mg total) by mouth 2 (two) times daily. 20 tablet 0   ondansetron (ZOFRAN-ODT) 4 MG disintegrating tablet Take 1 tablet (4 mg total) by mouth every 8 (eight) hours as needed for nausea or vomiting. 20 tablet 0   topiramate (TOPAMAX) 200 MG tablet Take 300 mg by mouth at bedtime.      [DISCONTINUED] prochlorperazine (COMPAZINE) 10 MG tablet Take 1 tablet (10 mg total) by mouth 2 (two) times daily as needed for nausea or vomiting. 10 tablet 0    Past Medical History:  Diagnosis Date   COVID    History of kidney stones    Migraines    hz of migraines    Polyp of colon 03/11/2007   BENIGN    Past Surgical History:  Procedure Laterality Date   CESAREAN SECTION  12/09/1998   CHOLECYSTECTOMY N/A 10/18/2015   Procedure: LAPAROSCOPIC CHOLECYSTECTOMY;  Surgeon: Rodman Pickle, MD;  Location: [facility] OR;  Service: General;  Laterality: N/A;   CYSTOSCOPY/URETEROSCOPY/HOLMIUM LASER/STENT PLACEMENT Left 07/31/2020   Procedure: CYSTOSCOPY/URETEROSCOPY/HOLMIUM LASER/STENT PLACEMENT;  Surgeon: Noel Christmas, MD;  Location: [facility] ORS;  Service: Urology;  Laterality: Left;  90 MINS   LAPAROSCOPIC CHOLECYSTECTOMY  10/18/2015    Social History   Tobacco Use   Smoking status: Former    Current packs/day: 0.00    Average packs/day: 0.5 packs/day for 7.0 years (3.5 ttl pk-yrs)  Types: Cigarettes    Start date: 10/12/1985    Quit date: 10/12/1992    Years since quitting: 31.0   Smokeless tobacco: Never  Vaping Use   Vaping status: Never Used  Substance Use Topics   Alcohol use: Not Currently   Drug use: No    Family History  Problem Relation Age of Onset   Osteoporosis Mother    Breast cancer Mother 53   Colon cancer Mother 46       removed large intestine   Lymphoma Mother 45   Hypertension Father    Osteoporosis  Maternal Grandmother    Lymphoma Maternal Grandmother 79   Colon polyps Sister        "several" dx. 49 or younger   Other Sister        hysterectomy at 37; suspicious breast finding removed at 43   Colon cancer Maternal Aunt 43   Lymphoma Maternal Uncle 63   Lung cancer Maternal Grandfather 66       metastatic to bone; former smoker   Throat cancer Paternal Grandmother 96       not a smoker   Heart Problems Paternal Grandfather    Other Other        suspicious breast finding at 25 - recent follow-up normal   Heart Problems Maternal Uncle    Bladder Cancer Paternal Uncle 38   Throat cancer Paternal Uncle        dx. before age 23    PE: Vitals:   10/23/23 0730 10/23/23 0800 10/23/23 0830 10/23/23 0901  BP: 134/71 125/62 125/66   Pulse: (!) 127 (!) 121 (!) 123   Resp: 20 17 19    Temp:    100 F (37.8 C)  TempSrc:    Oral  SpO2: 93% 90% 92%   Weight:    89.4 kg  Height:    5\' 6"  (1.676 m)   Patient appears to be in a cute distress.  No cervical or supraclavicular lymphadenopathy appreciated No increased work of breathing, no audible wheezes/rhonchi Regular sinus rhythm/rate Abdomen is soft, nontender, nondistended, Left CVA tenderness present Lower extremities are symmetric without appreciable edema Grossly neurologically intact No identifiable skin lesions  Recent Labs    10/23/23 0121  WBC 14.4*  HGB 14.6  HCT 44.3   Recent Labs    10/23/23 0121  NA 134*  K 3.7  CL 104  CO2 20*  GLUCOSE 181*  BUN 32*  CREATININE 0.91  CALCIUM 9.4   No results for input(s): "LABPT", "INR" in the last 72 hours. No results for input(s): "LABURIN" in the last 72 hours. Results for orders placed or performed during the hospital encounter of 06/03/23  Urine Culture (for pregnant, neutropenic or urologic patients or patients with an indwelling urinary catheter)     Status: Abnormal   Collection Time: 06/03/23  5:04 PM   Specimen: Urine, Clean Catch  Result Value Ref Range  Status   Specimen Description   Final    URINE, CLEAN CATCH Performed at [facility], [address] [place], [place] [address]    Special Requests   Final    NONE Performed at [facility], [address] [place], [place] [address]    Culture >=100,000 COLONIES/mL ESCHERICHIA COLI (A)  Final   Report Status 06/06/2023 FINAL  Final   Organism ID, Bacteria ESCHERICHIA COLI (A)  Final      Susceptibility   Escherichia coli - MIC*    AMPICILLIN >=  32 RESISTANT Resistant     CEFAZOLIN <=4 SENSITIVE Sensitive     CEFEPIME <=0.12 SENSITIVE Sensitive     CEFTRIAXONE <=0.25 SENSITIVE Sensitive     CIPROFLOXACIN 1 RESISTANT Resistant     GENTAMICIN <=1 SENSITIVE Sensitive     IMIPENEM <=0.25 SENSITIVE Sensitive     NITROFURANTOIN <=16 SENSITIVE Sensitive     TRIMETH/SULFA <=20 SENSITIVE Sensitive     AMPICILLIN/SULBACTAM 16 INTERMEDIATE Intermediate     PIP/TAZO <=4 SENSITIVE Sensitive     * >=100,000 COLONIES/mL ESCHERICHIA COLI    Imaging: CT 10/23/23 IMPRESSION: 1. 5 mm obstructing renal calculus within the mid to distal left ureter. 2. 2 mm and 3 mm nonobstructing left renal calculi. 3. Colonic diverticulosis. 4. Evidence of prior cholecystectomy. 5. Aortic atherosclerosis.    Imp: 27 F w/ L obstructive pyelo/   Recommendations: - will take to OR for L ureteral stent placement  - L side marked  We discussed risk benefits alternatives to stent placement.  This included bleeding infection and damage to surrounding structures surrounding structures including ureter as well as urethra.  We discussed the need for stent postoperatively as well as the potential symptoms of stent placement.  We discussed possible inability to complete procedure due to caliber of your ureter or inability to pass stone possibly requiring long-term stent versus nephrostomy tube.  We discussed need for possible second surgery.  Patient voiced their understanding and  consent was obtained.    Thank you for involving me in this patient's care, I will continue to follow along.Please page with any further questions or concerns. Adonis Brook

## 2023-10-23 NOTE — ED Triage Notes (Signed)
  Patient comes in with L flank pain that started around 2000 last night.  Patient states she took a percocet around that time and was trying to "ride it out" but pain became more severe.  Patient has hx of kidney stones.  Endorses nausea with no vomiting.  Pain 10/10, sharp.

## 2023-10-23 NOTE — Anesthesia Procedure Notes (Signed)
Procedure Name: Intubation Date/Time: 10/23/2023 10:23 AM  Performed by: Doran Clay, CRNAPre-anesthesia Checklist: Patient identified, Emergency Drugs available, Suction available, Patient being monitored and Timeout performed Patient Re-evaluated:Patient Re-evaluated prior to induction Oxygen Delivery Method: Circle system utilized Preoxygenation: Pre-oxygenation with 100% oxygen Induction Type: IV induction, Cricoid Pressure applied and Rapid sequence Laryngoscope Size: Mac and 4 Grade View: Grade I Tube type: Oral Tube size: 7.5 mm Number of attempts: 1 Airway Equipment and Method: Stylet Placement Confirmation: ETT inserted through vocal cords under direct vision, positive ETCO2 and breath sounds checked- equal and bilateral Secured at: 22 cm Tube secured with: Tape Dental Injury: Teeth and Oropharynx as per pre-operative assessment

## 2023-10-23 NOTE — Progress Notes (Signed)
MEWS Progress Note  Patient Details Name: Patricia Erickson MRN: [id number] DOB: July 29, 1971 Today's Date: 10/23/2023   MEWS Flowsheet Documentation:  Assess: MEWS Score Temp: (!) 100.6 F (38.1 C) BP: (!) 103/59 MAP (mmHg): 73 Pulse Rate: (!) 114 ECG Heart Rate: (!) 113 Resp: 20 Level of Consciousness: Alert SpO2: 92 % O2 Device: Room Air Assess: MEWS Score MEWS Temp: 1 MEWS Systolic: 0 MEWS Pulse: 2 MEWS RR: 0 MEWS LOC: 0 MEWS Score: 3 MEWS Score Color: Yellow Assess: SIRS CRITERIA SIRS Temperature : 0 SIRS Respirations : 0 SIRS Pulse: 1 SIRS WBC: 0 SIRS Score Sum : 1 Assess: if the MEWS score is Yellow or Red Were vital signs accurate and taken at a resting state?: Yes Does the patient meet 2 or more of the SIRS criteria?: Yes Does the patient have a confirmed or suspected source of infection?: Yes MEWS guidelines implemented : Yes, yellow Treat MEWS Interventions: Considered administering scheduled or prn medications/treatments as ordered Take Vital Signs Increase Vital Sign Frequency : Yellow: Q2hr x1, continue Q4hrs until patient remains green for 12hrs Escalate MEWS: Escalate: Yellow: Discuss with charge nurse and consider notifying provider and/or RRT

## 2023-10-23 NOTE — ED Notes (Signed)
Pt has a lactic critical of 3.75 the MD was made aware.

## 2023-10-23 NOTE — Progress Notes (Addendum)
  Carryover admission to the Day Admitter.  I discussed this case with the EDP, Army Melia, PA.  Per these discussions:   This is a 52 year old female with history ureteral stent in 2021, requiring lithotripsy, stent placement, who is being admitted this evening for obstructing left ureteral stone, complicated by concern for associated urinary tract infection after presenting with 1 to 2 days of progressive left flank discomfort associated with nausea, vomiting, with ensuing CT abdomen/pelvis showing evidence of 5 mm left-sided ureteral stone.   In the ED this evening, she does not have an objective fever, but is noted to possess a leukocytosis with Mobisyl count of 14,000.  Urinalysis also reported to be concerning for UTI.  Left-sided flank discomfort has been difficult to control, in spite of doses of Dilaudid, morphine, Toradol, droperidol.   EDP has discussed patient's case with on-call urology, Dr. Pete Glatter, who will formally consult and see the patient in the morning, requesting that the patient be kept n.p.o. in the meantime.   I have placed an order for observation to med/tele for further evaluation and management of the above.  I have placed some additional preliminary admit orders via the adult multi-morbid admission order set. I have also ordered prn IV Dilaudid, prn IV Toradol, prn IV Zofran.  I have also continued Rocephin, and ordered lactated Ringer's at 125 cc/h x 10 hours.  I have kept the patient n.p.o. per urology request and have added on a serum magnesium level.  Urine culture is pending. Given the patient's rigors, I've also added blood cx's x 2, and have added STAT LA q3h x 2 occurences.     Newton Pigg, DO Hospitalist

## 2023-10-23 NOTE — Anesthesia Preprocedure Evaluation (Addendum)
Anesthesia Evaluation  Patient identified by MRN, date of birth, ID band Patient awake    Reviewed: Allergy & Precautions, NPO status , Patient's Chart, lab work & pertinent test results  Airway Mallampati: III  TM Distance: >3 FB Neck ROM: Full  Mouth opening: Limited Mouth Opening  Dental  (+) Teeth Intact   Pulmonary former smoker   Pulmonary exam normal breath sounds clear to auscultation       Cardiovascular negative cardio ROS Normal cardiovascular exam Rhythm:Regular Rate:Normal     Neuro/Psych  Headaches  negative psych ROS   GI/Hepatic negative GI ROS, Neg liver ROS,,,  Endo/Other  negative endocrine ROS    Renal/GU negative Renal ROS  negative genitourinary   Musculoskeletal negative musculoskeletal ROS (+)    Abdominal   Peds  Hematology negative hematology ROS (+)   Anesthesia Other Findings  52 y.o. female with medical history significant of migraine headaches, COVID-19, colon polyps, diverticulosis, history of diverticulitis, cholelithiasis, intermittent palpitations, class I obesity, cervical and lumbar radiculopathy, lower back pain, hemorrhoids, history of nephrolithiasis   Reproductive/Obstetrics                             Anesthesia Physical Anesthesia Plan  ASA: 2 and emergent  Anesthesia Plan: General   Post-op Pain Management:    Induction: Intravenous and Rapid sequence  PONV Risk Score and Plan: 3 and Midazolam, Dexamethasone and Ondansetron  Airway Management Planned: Oral ETT  Additional Equipment:   Intra-op Plan:   Post-operative Plan: Extubation in OR  Informed Consent: I have reviewed the patients History and Physical, chart, labs and discussed the procedure including the risks, benefits and alternatives for the proposed anesthesia with the patient or authorized representative who has indicated his/her understanding and acceptance.     Dental  advisory given  Plan Discussed with: CRNA  Anesthesia Plan Comments:        Anesthesia Quick Evaluation

## 2023-10-23 NOTE — Op Note (Signed)
Preoperative diagnosis: left obstructing ureteral stone Postoperative diagnosis: Same  Procedures performed: Cystoscopy, left retrograde pyelogram with interpretation, left ureteral stent placement  Surgeon: Dr. Vilma Prader  Findings:  Pan cystoscopy with evidence of cystitis left retrograde pyelogram small distal filling defect contrast would not pass. 6 x 24 double-J stent without strings placed in the left ureter proper placement confirmed with fluoroscopy  Specimens:left renal pelvis urine culture  Indication: Patricia Erickson is a 52 y.o. patient with ureteral stones tachycardia and fever concerning for obstructive pyelonephritis . After reviewing the management options for treatment, he elected to proceed with the above surgical procedure(s). We have discussed the potential benefits and risks of the procedure, side effects of the proposed treatment, the likelihood of the patient achieving the goals of the procedure, and any potential problems that might occur during the procedure or recuperation. Informed consent has been obtained.  Procedure in detail: Patient was consented prior to being brought back to the operating room. He was then brought back to the operating room placed on the table in supine position. General anesthesia was then induced and endotracheal tube inserted. This then placed in dorsolithotomy position and prepped and draped in the routine sterile fashion. A timeout was held.  Using a 22.5 [place] cystoscope with a 30  lens, I gently passed the scope into the patient's urethra and into the bladder under visual guidance. A 360 cystoscopic evaluation was performed with evidence of cystitis, no tumors, and no foreign bodies identified.  A 5 French ureteral catheter was placed at the left ureteral orifice and a retrograde pyelogram using 10 cc of contrast was done demonstrating a distal filling defect no contrast past the filling defect.  Then I 0.38 sensor wire was  placed in the collecting system proper placement was confirmed with fluoroscopy.  I then slid a 24 cm x 6 French double-J ureteral stent over the wire and into the renal pelvis under fluoroscopic guidance. Once a nice curl was noted in the renal pelvis I advanced the distal end of the stent into the bladder before removing the wire completely. A final fluoroscopic image was obtained confirming the curl in the renal pelvis as well as a curl in the bladder.  18 French catheter was placed in the urethra at the end the case 10 cc of saline were placed into the balloon.  Disposition:  Continue broad-spectrum antibiotics Continue catheter until patient is 24 hours afebrile Patient has been started on regular diet Patient will continue stent until can be treated for definitive left ureteroscopy with laser lithotripsy as an outpatient.

## 2023-10-24 ENCOUNTER — Encounter (HOSPITAL_COMMUNITY): Payer: Self-pay | Admitting: Urology

## 2023-10-24 DIAGNOSIS — Z8249 Family history of ischemic heart disease and other diseases of the circulatory system: Secondary | ICD-10-CM | POA: Diagnosis not present

## 2023-10-24 DIAGNOSIS — Z8262 Family history of osteoporosis: Secondary | ICD-10-CM | POA: Diagnosis not present

## 2023-10-24 DIAGNOSIS — R739 Hyperglycemia, unspecified: Secondary | ICD-10-CM

## 2023-10-24 DIAGNOSIS — E876 Hypokalemia: Secondary | ICD-10-CM | POA: Diagnosis present

## 2023-10-24 DIAGNOSIS — Z9049 Acquired absence of other specified parts of digestive tract: Secondary | ICD-10-CM | POA: Diagnosis not present

## 2023-10-24 DIAGNOSIS — Z87891 Personal history of nicotine dependence: Secondary | ICD-10-CM | POA: Diagnosis not present

## 2023-10-24 DIAGNOSIS — E872 Acidosis, unspecified: Secondary | ICD-10-CM | POA: Diagnosis present

## 2023-10-24 DIAGNOSIS — N134 Hydroureter: Secondary | ICD-10-CM | POA: Diagnosis not present

## 2023-10-24 DIAGNOSIS — N136 Pyonephrosis: Secondary | ICD-10-CM | POA: Diagnosis present

## 2023-10-24 DIAGNOSIS — I959 Hypotension, unspecified: Secondary | ICD-10-CM | POA: Diagnosis present

## 2023-10-24 DIAGNOSIS — Z1611 Resistance to penicillins: Secondary | ICD-10-CM | POA: Diagnosis present

## 2023-10-24 DIAGNOSIS — N1330 Unspecified hydronephrosis: Secondary | ICD-10-CM | POA: Diagnosis not present

## 2023-10-24 DIAGNOSIS — N202 Calculus of kidney with calculus of ureter: Secondary | ICD-10-CM | POA: Diagnosis present

## 2023-10-24 DIAGNOSIS — N201 Calculus of ureter: Secondary | ICD-10-CM | POA: Diagnosis not present

## 2023-10-24 DIAGNOSIS — G8929 Other chronic pain: Secondary | ICD-10-CM | POA: Diagnosis present

## 2023-10-24 DIAGNOSIS — Z8669 Personal history of other diseases of the nervous system and sense organs: Secondary | ICD-10-CM

## 2023-10-24 DIAGNOSIS — A419 Sepsis, unspecified organism: Secondary | ICD-10-CM | POA: Diagnosis not present

## 2023-10-24 DIAGNOSIS — Z8616 Personal history of COVID-19: Secondary | ICD-10-CM | POA: Diagnosis not present

## 2023-10-24 DIAGNOSIS — E66811 Obesity, class 1: Secondary | ICD-10-CM

## 2023-10-24 DIAGNOSIS — A4151 Sepsis due to Escherichia coli [E. coli]: Secondary | ICD-10-CM | POA: Diagnosis present

## 2023-10-24 DIAGNOSIS — R6520 Severe sepsis without septic shock: Secondary | ICD-10-CM | POA: Insufficient documentation

## 2023-10-24 DIAGNOSIS — I700 Atherosclerosis of aorta: Secondary | ICD-10-CM

## 2023-10-24 DIAGNOSIS — D6959 Other secondary thrombocytopenia: Secondary | ICD-10-CM | POA: Diagnosis not present

## 2023-10-24 DIAGNOSIS — N390 Urinary tract infection, site not specified: Secondary | ICD-10-CM

## 2023-10-24 DIAGNOSIS — Z807 Family history of other malignant neoplasms of lymphoid, hematopoietic and related tissues: Secondary | ICD-10-CM | POA: Diagnosis not present

## 2023-10-24 DIAGNOSIS — Z87442 Personal history of urinary calculi: Secondary | ICD-10-CM | POA: Diagnosis not present

## 2023-10-24 DIAGNOSIS — Z800 Family history of malignant neoplasm of digestive organs: Secondary | ICD-10-CM | POA: Diagnosis not present

## 2023-10-24 DIAGNOSIS — M5416 Radiculopathy, lumbar region: Secondary | ICD-10-CM | POA: Diagnosis present

## 2023-10-24 DIAGNOSIS — Z6836 Body mass index (BMI) 36.0-36.9, adult: Secondary | ICD-10-CM | POA: Diagnosis not present

## 2023-10-24 DIAGNOSIS — R109 Unspecified abdominal pain: Secondary | ICD-10-CM | POA: Diagnosis present

## 2023-10-24 LAB — COMPREHENSIVE METABOLIC PANEL
ALT: 29 U/L (ref 0–44)
AST: 32 U/L (ref 15–41)
Albumin: 2.9 g/dL — ABNORMAL LOW (ref 3.5–5.0)
Alkaline Phosphatase: 50 U/L (ref 38–126)
Anion gap: 8 (ref 5–15)
BUN: 26 mg/dL — ABNORMAL HIGH (ref 6–20)
CO2: 20 mmol/L — ABNORMAL LOW (ref 22–32)
Calcium: 7.7 mg/dL — ABNORMAL LOW (ref 8.9–10.3)
Chloride: 112 mmol/L — ABNORMAL HIGH (ref 98–111)
Creatinine, Ser: 0.94 mg/dL (ref 0.44–1.00)
GFR, Estimated: >60 mL/min (ref >60)
Glucose, Bld: 144 mg/dL — ABNORMAL HIGH (ref 70–99)
Potassium: 3.4 mmol/L — ABNORMAL LOW (ref 3.5–5.1)
Sodium: 140 mmol/L (ref 135–145)
Total Bilirubin: 0.4 mg/dL (ref <1.2)
Total Protein: 5.4 g/dL — ABNORMAL LOW (ref 6.5–8.1)

## 2023-10-24 LAB — CBC WITH DIFFERENTIAL/PLATELET
Abs Immature Granulocytes: 0.72 10*3/uL — ABNORMAL HIGH (ref 0.00–0.07)
Basophils Absolute: 0.1 10*3/uL (ref 0.0–0.1)
Basophils Relative: 0 %
Eosinophils Absolute: 0 10*3/uL (ref 0.0–0.5)
Eosinophils Relative: 0 %
HCT: 35.3 % — ABNORMAL LOW (ref 36.0–46.0)
Hemoglobin: 11.6 g/dL — ABNORMAL LOW (ref 12.0–15.0)
Immature Granulocytes: 3 %
Lymphocytes Relative: 6 %
Lymphs Abs: 1.3 10*3/uL (ref 0.7–4.0)
MCH: 30.3 pg (ref 26.0–34.0)
MCHC: 32.9 g/dL (ref 30.0–36.0)
MCV: 92.2 fL (ref 80.0–100.0)
Monocytes Absolute: 1 10*3/uL (ref 0.1–1.0)
Monocytes Relative: 4 %
Neutro Abs: 20.3 10*3/uL — ABNORMAL HIGH (ref 1.7–7.7)
Neutrophils Relative %: 87 %
Platelets: 147 10*3/uL — ABNORMAL LOW (ref 150–400)
RBC: 3.83 MIL/uL — ABNORMAL LOW (ref 3.87–5.11)
RDW: 14.3 % (ref 11.5–15.5)
WBC: 23.5 10*3/uL — ABNORMAL HIGH (ref 4.0–10.5)
nRBC: 0 % (ref 0.0–0.2)

## 2023-10-24 LAB — HEMOGLOBIN A1C
Hgb A1c MFr Bld: 5.7 % — ABNORMAL HIGH (ref 4.8–5.6)
Mean Plasma Glucose: 116.89 mg/dL

## 2023-10-24 LAB — MAGNESIUM: Magnesium: 1.7 mg/dL (ref 1.7–2.4)

## 2023-10-24 MED ORDER — MAGNESIUM SULFATE IN D5W 1-5 GM/100ML-% IV SOLN
1.0000 g | Freq: Once | INTRAVENOUS | Status: AC
  Administered 2023-10-24: 1 g via INTRAVENOUS
  Filled 2023-10-24: qty 100

## 2023-10-24 MED ORDER — POTASSIUM CHLORIDE CRYS ER 20 MEQ PO TBCR
60.0000 meq | EXTENDED_RELEASE_TABLET | Freq: Once | ORAL | Status: AC
  Administered 2023-10-24: 60 meq via ORAL
  Filled 2023-10-24: qty 3

## 2023-10-24 MED ORDER — ENOXAPARIN SODIUM 40 MG/0.4ML IJ SOSY
40.0000 mg | PREFILLED_SYRINGE | INTRAMUSCULAR | Status: DC
  Administered 2023-10-24 – 2023-10-28 (×5): 40 mg via SUBCUTANEOUS
  Filled 2023-10-24 (×5): qty 0.4

## 2023-10-24 NOTE — Progress Notes (Signed)
1 Day Post-Op Subjective: The patient is doing better.  No nausea or vomiting. Pain is adequately controlled. She appears much improved from yesterday. Patient has not ambulated.   Objective: Vital signs in last 24 hours: Temp:  [98.3 F (36.8 C)-101.6 F (38.7 C)] 98.3 F (36.8 C) (11/15 0400) Pulse Rate:  [72-127] 85 (11/15 0600) Resp:  [11-20] 16 (11/15 0600) BP: (91-134)/(49-71) 112/66 (11/15 0600) SpO2:  [90 %-100 %] 95 % (11/15 0600) Weight:  [89.4 kg-95.8 kg] 95.8 kg (11/15 0439)  Intake/Output from previous day: 11/14 0701 - 11/15 0700 In: 4652.8 [I.V.:2116; IV Piggyback:2536.8] Out: 1200 [Urine:1200] Intake/Output this shift: No intake/output data recorded.  Physical Exam:  General: Alert and oriented. CV: RRR per monitor  Lungs: Clear bilaterally. GU: catheter in place draining clear yellow urine/ Urine: Clear,    Lab Results: Recent Labs    10/23/23 0121 10/24/23 0251  HGB 14.6 11.6*  HCT 44.3 35.3*    Assessment/Plan: 46 F w/ L obstructive pyelonephritis s/p stent on 11/14.  Recommendations: - continue broad spectrum antibiotics then can narrow spectrum once Cultures result (recommend 14 days total abx)  - continue catheter until 24 hours afebrile then can void trial - continue stent will plan for ureteroscopy to remove the stone as outpatient.  - DC instructions have been pasted below - urology to follow peripherally.     DISCHARGE INSTRUCTIONS FOR KIDNEY STONE/URETERAL STENT   MEDICATIONS:  1.  Resume all your other meds from home - except do not take any extra narcotic pain meds that you may have at home.    ACTIVITY:  1. No strenuous activity x 1week  2. No driving while on narcotic pain medications  3. Drink plenty of water  4. Continue to walk at home - it is normal to see blood in the urine while the stent is in place, so keep active, but don't over do it.  5. May return to work/school tomorrow or when you feel ready  6. You may  experience some pain when urinating in the kidney on the side that was operated on while the stent is in place this is normal  BATHING:  1. You can shower and we recommend daily showers  2. You have a string coming from your urethra: The stent string is attached to your ureteral stent. Do not pull on this.   SIGNS/SYMPTOMS TO CALL:  Please call us if you have a fever greater than 101.5, uncontrolled nausea/vomiting, uncontrolled pain, dizziness, unable to urinate, bloody urine with clots greater than the size of a quarter, chest pain, shortness of breath, leg swelling, leg pain, redness around wound, drainage from wound, or any other concerns or questions.   You can reach [place] at [phone].   FOLLOW-UP:  1. The stent will remain in place until you have your stone treated. 2. You will be called to set up surgery to remove the stone.    Vilma Prader MD.

## 2023-10-24 NOTE — Anesthesia Postprocedure Evaluation (Signed)
Anesthesia Post Note  Patient: Linda Venteicher  Procedure(s) Performed: CYSTOSCOPY WITH RETROGRADE PYELOGRAM STENT PLACEMENT (Left)     Patient location during evaluation: PACU Anesthesia Type: General Level of consciousness: awake and alert Pain management: pain level controlled Vital Signs Assessment: post-procedure vital signs reviewed and stable Respiratory status: spontaneous breathing, nonlabored ventilation, respiratory function stable and patient connected to nasal cannula oxygen Cardiovascular status: blood pressure returned to baseline and stable Postop Assessment: no apparent nausea or vomiting Anesthetic complications: no  No notable events documented.  Last Vitals:  Vitals:   10/24/23 0500 10/24/23 0600  BP: 97/63 112/66  Pulse: 72 85  Resp: 13 16  Temp:    SpO2: 92% 95%    Last Pain:  Vitals:   10/24/23 0642  TempSrc:   PainSc: Asleep                 Travonta Gill L Anyela Napierkowski

## 2023-10-24 NOTE — Progress Notes (Addendum)
Arrived to unit from  PACU at 1220 this afternoon. Remains a yellow MEWS.  Drowsy, falls back asleep after a few moments of being awake.  Unable to complete Admission Assessment questions at this time.    10/23/23 1421  Vitals  Temp (!) 100.4 F (38 C)  Temp Source Axillary  BP (!) 91/55  MAP (mmHg) 67  BP Location Left Arm  BP Method Automatic  Patient Position (if appropriate) Lying  Pulse Rate (!) 105  Pulse Rate Source Dinamap  ECG Heart Rate (!) 105  Resp 15  Level of Consciousness  Level of Consciousness Alert  MEWS COLOR  MEWS Score Color Yellow  Oxygen Therapy  SpO2 92 %  O2 Device Room Air  MEWS Score  MEWS Temp 0  MEWS Systolic 1  MEWS Pulse 1  MEWS RR 0  MEWS LOC 0  MEWS Score 2

## 2023-10-24 NOTE — Plan of Care (Signed)

## 2023-10-24 NOTE — Progress Notes (Signed)
PROGRESS NOTE  Patricia Erickson QMV:[id number] DOB: 12/09/1971   PCP: Marianne Sofia, PA-C  Patient is from: Home  DOA: 10/23/2023 LOS: 0  Chief complaints Chief Complaint  Patient presents with   Flank Pain     Brief Narrative / Interim history: 52 year old F with PMH of low back pain with radiculopathy, nephrolithiasis, migraine headache, diverticulosis and obesity presenting with left flank pain with associated nausea and vomiting and admitted with severe sepsis in the setting of complicated UTI with obstructing 5 mm left ureteral stone, mild left-sided hydroureteronephrosis and mild left perinephric inflammatory fat stranding.  Patient was febrile with tachycardia, leukocytosis and lactic acidosis.  UA concerning for UTI.  Resuscitated with IV fluid and started on IV ceftriaxone.  Urology consulted.  Patient underwent left ureteral stent placement on 11/14.  Blood cultures NGTD.  Urine culture with GNR.  Subjective: Seen and examined earlier this morning.  No major events overnight of this morning.  Reports improvement in her symptoms.  She says her pain was 50/10 when she came in, and now down to 5/10.  She has some lower abdominal pain as well.  Reports regular, this morning.  Still with Foley catheter.  Blood pressure improved.  Objective: Vitals:   10/24/23 0600 10/24/23 0700 10/24/23 0800 10/24/23 0900  BP: 112/66 116/63    Pulse: 85 83 81 88  Resp: 16 13 17 17   Temp:   97.8 F (36.6 C)   TempSrc:   Axillary   SpO2: 95% 95% 98% 98%  Weight:      Height:        Examination:  GENERAL: No apparent distress.  Nontoxic. HEENT: MMM.  Vision and hearing grossly intact.  NECK: Supple.  No apparent JVD.  RESP:  No IWOB.  Fair aeration bilaterally. CVS:  RRR. Heart sounds normal.  ABD/GI/GU: BS+. Abd soft.  Some tenderness across lower abdomen.  Foley catheter in place. MSK/EXT:  Moves extremities. No apparent deformity. No edema.  SKIN: no apparent skin lesion or  wound NEURO: Awake, alert and oriented appropriately.  No apparent focal neuro deficit. PSYCH: Calm. Normal affect.   Procedures:  11/14-cystoscopy and left ureteral stent  Microbiology summarized: Blood cultures NGTD Urine culture with GNR  Assessment and plan: Severe sepsis due to complicated UTI and left pyelonephrosis in the setting of obstructing left ureteral stone: POA.  Had fever, tachycardia, leukocytosis and lactic acidosis on presentation.  Sepsis physiology improved except for leukocytosis.  Lactic acidosis resolved.  Blood pressure improved.  Blood cultures NGTD.  Urine culture with GNR. -Continue IV ceftriaxone pending urine culture speciation and sensitivity -Discontinue IV fluid -Transfer to regular floor  Obstructive left ureteral stone with left hydroureteronephrosis -S/p cystoscopy and ureteral stent by urology -Urology recommended discontinuing Foley once afebrile for 24 hours. -Outpatient follow-up with urology for definitive treatment  Hypotension: Likely due to sepsis.  Resolved. -Discontinue IV fluid  Hyperglycemia: Likely stress-induced.  No history of diabetes.  A1c 5.5% in 2018. -Check hemoglobin A1c -Monitor glucose with daily labs.   Aortic atherosclerosis: Noted on imaging. -Outpatient follow-up with PCP   History of migraine headaches/chronic low back pain with radiculopathy: Stable -Continue home meds  Hypokalemia: K3.4.  Mg 1.7. -Monitor replenish as appropriate -IV magnesium sulfate 1 g x 1  Thrombocytopenia: Mild.  Likely due to #1. -Continue monitoring  Abdominal pain/tenderness: Likely due to #1 and 2.   Leukocytosis/bandemia: Likely due to #1 -Antibiotics as above -Continue monitoring  Obesity Body mass index is 34.09 kg/m. -  Encourage lifestyle change to lose weight          DVT prophylaxis:  SCDs Start: 10/23/23 0531  Code Status: Full code Family Communication: Severe sepsis due to complicated UTI Level of care:  Med-Surg Status is: Observation The patient will require care spanning > 2 midnights and should be moved to inpatient because: Severe sepsis due to complicated UTI/pyelonephritis with obstructing left ureteral stone   Final disposition: Home Consultants:  Urology  55 minutes with more than 50% spent in reviewing records, counseling patient/family and coordinating care.   Sch Meds:  Scheduled Meds:  Chlorhexidine Gluconate Cloth  6 each Topical Daily   Continuous Infusions:  cefTRIAXone (ROCEPHIN)  IV Stopped (10/24/23 0305)   magnesium sulfate bolus IVPB     PRN Meds:.acetaminophen **OR** acetaminophen, HYDROmorphone (DILAUDID) injection, ketorolac, naLOXone (NARCAN)  injection, ondansetron (ZOFRAN) IV, mouth rinse  Antimicrobials: Anti-infectives (From admission, onward)    Start     Dose/Rate Route Frequency Ordered Stop   10/24/23 0200  cefTRIAXone (ROCEPHIN) 1 g in sodium chloride 0.9 % 100 mL IVPB        1 g 200 mL/hr over 30 Minutes Intravenous Every 24 hours 10/23/23 0532     10/23/23 1031  vancomycin (VANCOCIN) 1-5 GM/200ML-% IVPB       Note to Pharmacy: [name]: cabinet override      10/23/23 1031 10/23/23 2244   10/23/23 0445  cefTRIAXone (ROCEPHIN) 1 g in sodium chloride 0.9 % 100 mL IVPB        1 g 200 mL/hr over 30 Minutes Intravenous  Once 10/23/23 0444 10/23/23 0532        I have personally reviewed the following labs and images: CBC: Recent Labs  Lab 10/23/23 0121 10/24/23 0251  WBC 14.4* 23.5*  NEUTROABS 12.6* 20.3*  HGB 14.6 11.6*  HCT 44.3 35.3*  MCV 89.3 92.2  PLT 315 147*   BMP &GFR Recent Labs  Lab 10/23/23 0121 10/24/23 0251  NA 134* 140  K 3.7 3.4*  CL 104 112*  CO2 20* 20*  GLUCOSE 181* 144*  BUN 32* 26*  CREATININE 0.91 0.94  CALCIUM 9.4 7.7*  MG 2.2 1.7   Estimated Creatinine Clearance: 79.8 mL/min (by C-G formula based on SCr of 0.94 mg/dL). Liver & Pancreas: Recent Labs  Lab 10/23/23 0121 10/24/23 0251   AST 28 32  ALT 34 29  ALKPHOS 86 50  BILITOT 0.5 0.4  PROT 7.7 5.4*  ALBUMIN 4.5 2.9*   Recent Labs  Lab 10/23/23 0121  LIPASE 31   No results for input(s): "AMMONIA" in the last 168 hours. Diabetic: No results for input(s): "HGBA1C" in the last 72 hours. No results for input(s): "GLUCAP" in the last 168 hours. Cardiac Enzymes: No results for input(s): "CKTOTAL", "CKMB", "CKMBINDEX", "TROPONINI" in the last 168 hours. No results for input(s): "PROBNP" in the last 8760 hours. Coagulation Profile: No results for input(s): "INR", "PROTIME" in the last 168 hours. Thyroid Function Tests: No results for input(s): "TSH", "T4TOTAL", "FREET4", "T3FREE", "THYROIDAB" in the last 72 hours. Lipid Profile: No results for input(s): "CHOL", "HDL", "LDLCALC", "TRIG", "CHOLHDL", "LDLDIRECT" in the last 72 hours. Anemia Panel: No results for input(s): "VITAMINB12", "FOLATE", "FERRITIN", "TIBC", "IRON", "RETICCTPCT" in the last 72 hours. Urine analysis:    Component Value Date/Time   COLORURINE YELLOW 10/23/2023 0359   APPEARANCEUR HAZY (A) 10/23/2023 0359   LABSPEC 1.021 10/23/2023 0359   PHURINE 6.0 10/23/2023 0359   GLUCOSEU NEGATIVE 10/23/2023 0359  HGBUR NEGATIVE 10/23/2023 0359   BILIRUBINUR NEGATIVE 10/23/2023 0359   BILIRUBINUR negative 07/09/2022 0851   BILIRUBINUR neg 06/08/2015 1525   KETONESUR 5 (A) 10/23/2023 0359   PROTEINUR NEGATIVE 10/23/2023 0359   UROBILINOGEN 0.2 07/09/2022 0851   UROBILINOGEN 0.2 10/17/2015 2047   NITRITE POSITIVE (A) 10/23/2023 0359   LEUKOCYTESUR SMALL (A) 10/23/2023 0359   Sepsis Labs: Invalid input(s): "PROCALCITONIN", "LACTICIDVEN"  Microbiology: Recent Results (from the past 240 hour(s))  Urine Culture     Status: Abnormal (Preliminary result)   Collection Time: 10/23/23  3:59 AM   Specimen: Urine, Clean Catch  Result Value Ref Range Status   Specimen Description   Final    URINE, CLEAN CATCH Performed at [facility], [address] [place], [place] [address]    Special Requests   Final    NONE Performed at [facility], [address] [place], [place] [address]    Culture (A)  Final    >=100,000 COLONIES/mL GRAM NEGATIVE RODS SUSCEPTIBILITIES TO FOLLOW Performed at [facility], [address] [place], [place] [address]    Report Status PENDING  Incomplete  Culture, blood (Routine X 2) w Reflex to ID Panel     Status: None (Preliminary result)   Collection Time: 10/23/23  6:57 AM   Specimen: BLOOD  Result Value Ref Range Status   Specimen Description   Final    BLOOD SITE NOT SPECIFIED Performed at [facility], [address], [place], [place] [address]    Special Requests   Final    BOTTLES DRAWN AEROBIC AND ANAEROBIC Blood Culture results may not be optimal due to an inadequate volume of blood received in culture bottles Performed at [facility], [address] [place], [place] [address]    Culture   Final    NO GROWTH < 24 HOURS Performed at [facility], [address] [place], [place] [address]    Report Status PENDING  Incomplete  Culture, blood (Routine X 2) w Reflex to ID Panel     Status: None (Preliminary result)   Collection Time: 10/23/23 11:13 AM   Specimen: BLOOD  Result Value Ref Range Status   Specimen Description   Final    BLOOD BLOOD LEFT HAND Performed at [facility], [address], [place], [place] [address]    Special Requests   Final    AEROBIC BOTTLE ONLY Blood Culture adequate volume Performed at [facility] [address] [place], [place] [address]    Culture   Final    NO GROWTH < 24 HOURS Performed at [facility], [address] [place], [place] [address]    Report Status PENDING  Incomplete  MRSA Next Gen by PCR, Nasal     Status: None   Collection Time: 10/23/23  5:50 PM   Specimen: Nasal Mucosa; Nasal Swab  Result  Value Ref Range Status   MRSA by PCR Next Gen NOT DETECTED NOT DETECTED Final    Comment: (NOTE) The GeneXpert MRSA Assay (FDA approved for NASAL specimens only), is one component of a comprehensive MRSA colonization surveillance program. It is not intended to diagnose MRSA infection nor to guide or monitor treatment for MRSA infections. Test performance is not FDA approved in patients less than 34 years old. Performed at [facility], [address] [place], [place] [address]     Radiology Studies: No results  found.    Jonell Brumbaugh T. Orla Estrin [facility]  If 7PM-7AM, please contact night-coverage [email or web address] 10/24/2023, 10:45 AM

## 2023-10-25 ENCOUNTER — Inpatient Hospital Stay (HOSPITAL_COMMUNITY): Payer: 59

## 2023-10-25 DIAGNOSIS — Z8669 Personal history of other diseases of the nervous system and sense organs: Secondary | ICD-10-CM | POA: Diagnosis not present

## 2023-10-25 DIAGNOSIS — A419 Sepsis, unspecified organism: Secondary | ICD-10-CM | POA: Diagnosis not present

## 2023-10-25 DIAGNOSIS — N201 Calculus of ureter: Secondary | ICD-10-CM | POA: Diagnosis not present

## 2023-10-25 DIAGNOSIS — I700 Atherosclerosis of aorta: Secondary | ICD-10-CM | POA: Diagnosis not present

## 2023-10-25 LAB — URINE CULTURE: Culture: >=100000 — AB

## 2023-10-25 LAB — RENAL FUNCTION PANEL
Albumin: 2.8 g/dL — ABNORMAL LOW (ref 3.5–5.0)
Anion gap: 8 (ref 5–15)
BUN: 15 mg/dL (ref 6–20)
CO2: 20 mmol/L — ABNORMAL LOW (ref 22–32)
Calcium: 7.7 mg/dL — ABNORMAL LOW (ref 8.9–10.3)
Chloride: 105 mmol/L (ref 98–111)
Creatinine, Ser: 0.75 mg/dL (ref 0.44–1.00)
GFR, Estimated: >60 mL/min (ref >60)
Glucose, Bld: 115 mg/dL — ABNORMAL HIGH (ref 70–99)
Phosphorus: 1.5 mg/dL — ABNORMAL LOW (ref 2.5–4.6)
Potassium: 3.4 mmol/L — ABNORMAL LOW (ref 3.5–5.1)
Sodium: 133 mmol/L — ABNORMAL LOW (ref 135–145)

## 2023-10-25 LAB — CBC
HCT: 37.8 % (ref 36.0–46.0)
Hemoglobin: 12.2 g/dL (ref 12.0–15.0)
MCH: 29.5 pg (ref 26.0–34.0)
MCHC: 32.3 g/dL (ref 30.0–36.0)
MCV: 91.5 fL (ref 80.0–100.0)
Platelets: 140 10*3/uL — ABNORMAL LOW (ref 150–400)
RBC: 4.13 MIL/uL (ref 3.87–5.11)
RDW: 13.9 % (ref 11.5–15.5)
WBC: 11.7 10*3/uL — ABNORMAL HIGH (ref 4.0–10.5)
nRBC: 0 % (ref 0.0–0.2)

## 2023-10-25 LAB — MAGNESIUM: Magnesium: 2 mg/dL (ref 1.7–2.4)

## 2023-10-25 MED ORDER — POTASSIUM CHLORIDE CRYS ER 20 MEQ PO TBCR
40.0000 meq | EXTENDED_RELEASE_TABLET | Freq: Once | ORAL | Status: AC
  Administered 2023-10-25: 40 meq via ORAL
  Filled 2023-10-25: qty 2

## 2023-10-25 MED ORDER — POTASSIUM PHOSPHATES 15 MMOLE/5ML IV SOLN
30.0000 mmol | Freq: Once | INTRAVENOUS | Status: AC
  Administered 2023-10-25: 30 mmol via INTRAVENOUS
  Filled 2023-10-25: qty 10

## 2023-10-25 MED ORDER — HYDROMORPHONE HCL 1 MG/ML IJ SOLN
1.0000 mg | INTRAMUSCULAR | Status: DC | PRN
  Administered 2023-10-25 – 2023-10-27 (×3): 1 mg via INTRAVENOUS
  Filled 2023-10-25 (×3): qty 1

## 2023-10-25 MED ORDER — OXYCODONE HCL 5 MG PO TABS
5.0000 mg | ORAL_TABLET | Freq: Four times a day (QID) | ORAL | Status: DC | PRN
  Administered 2023-10-25 – 2023-10-28 (×6): 5 mg via ORAL
  Filled 2023-10-25 (×6): qty 1

## 2023-10-25 MED ORDER — ALUM & MAG HYDROXIDE-SIMETH 200-200-20 MG/5ML PO SUSP
30.0000 mL | ORAL | Status: DC | PRN
  Administered 2023-10-25 – 2023-10-26 (×2): 30 mL via ORAL
  Filled 2023-10-25 (×2): qty 30

## 2023-10-25 MED ORDER — DIPHENHYDRAMINE HCL 25 MG PO CAPS
25.0000 mg | ORAL_CAPSULE | Freq: Three times a day (TID) | ORAL | Status: DC | PRN
  Administered 2023-10-25: 25 mg via ORAL
  Filled 2023-10-25: qty 1

## 2023-10-25 MED ORDER — CEFAZOLIN SODIUM-DEXTROSE 1-4 GM/50ML-% IV SOLN
1.0000 g | Freq: Three times a day (TID) | INTRAVENOUS | Status: DC
  Administered 2023-10-26 (×2): 1 g via INTRAVENOUS
  Filled 2023-10-25 (×4): qty 50

## 2023-10-25 NOTE — Progress Notes (Signed)
PROGRESS NOTE  Patricia Erickson QMV:[id number] DOB: 13-Jul-1971   PCP: Marianne Sofia, PA-C  Patient is from: Home  DOA: 10/23/2023 LOS: 1  Chief complaints Chief Complaint  Patient presents with   Flank Pain     Brief Narrative / Interim history: 52 year old F with PMH of low back pain with radiculopathy, nephrolithiasis, migraine headache, diverticulosis and obesity presenting with left flank pain with associated nausea and vomiting and admitted with severe sepsis in the setting of complicated UTI with obstructing 5 mm left ureteral stone, mild left-sided hydroureteronephrosis and mild left perinephric inflammatory fat stranding.  Patient was febrile with tachycardia, leukocytosis and lactic acidosis.  UA concerning for UTI.  Resuscitated with IV fluid and started on IV ceftriaxone.  Urology consulted.  Patient underwent left ureteral stent placement on 11/14.  Blood cultures NGTD.  Urine culture with E. coli.  Antibiotic de-escalated to IV Ancef.  Leukocytosis improving.  Fever curve downtrending.   Subjective: Seen and examined earlier this morning.  Spiked fever 201.5 about midnight.  Slightly tachycardic.  Reports pain across lower abdomen.  Pain was worse with movement.  Pain improved some with IV Dilaudid.  She had some nausea that has resolved with antiemetics.  Objective: Vitals:   10/25/23 0200 10/25/23 0553 10/25/23 0555 10/25/23 1123  BP: 126/73  134/83 117/79  Pulse: (!) 108  (!) 105 (!) 103  Resp: 16  16 15   Temp: 100 F (37.8 C)  98.3 F (36.8 C) 99.5 F (37.5 C)  TempSrc: Oral  Oral Oral  SpO2: 90%  90% 92%  Weight:  100.9 kg    Height:        Examination:  GENERAL: No apparent distress.  Nontoxic. HEENT: MMM.  Vision and hearing grossly intact.  NECK: Supple.  No apparent JVD.  RESP:  No IWOB.  Fair aeration bilaterally. CVS:  RRR. Heart sounds normal.  ABD/GI/GU: BS+. Abd soft.  Tenderness across lower abdomen.  Foley catheter in place. MSK/EXT:  Moves  extremities. No apparent deformity. No edema.  SKIN: no apparent skin lesion or wound NEURO: Awake, alert and oriented appropriately.  No apparent focal neuro deficit. PSYCH: Calm. Normal affect.   Procedures:  11/14-cystoscopy and left ureteral stent  Microbiology summarized: Blood cultures NGTD Urine culture with E. coli.  Assessment and plan: Severe sepsis due to complicated UTI and left pyelonephrosis in the setting of obstructing left ureteral stone: POA.  Had fever, tachycardia, leukocytosis and lactic acidosis on presentation.   Blood cultures NGTD.  Urine culture with E. coli.  Lactic acidosis resolved.  Hypotension resolved.  Leukocytosis improved.  Fever curve downtrending. -Ceftriaxone 11/14>> IV Ancef 11/16>>  Obstructive left ureteral stone with left hydroureteronephrosis -S/p cystoscopy and ureteral stent by urology -Urology recommended discontinuing Foley once afebrile for 24 hours.  She might not defervesce soon with pyelo -Outpatient follow-up with urology for definitive treatment  Low abdominal pain/tenderness: Likely due to #1 and 2 but that does not seem to be improving -Check KUB -Pain control-continue IV Dilaudid and IV Toradol.  Added p.o. oxycodone.  Hypotension: Likely due to sepsis.  Resolved. -Discontinue IV fluid  Hyperglycemia: Likely stress-induced.  No history of diabetes.  A1c 5.7%. -Monitor glucose with daily labs.   Aortic atherosclerosis: Noted on imaging. -Outpatient follow-up with PCP   History of migraine headaches/chronic low back pain with radiculopathy: Stable -Continue home meds  Hypokalemia/hypophosphatemia:  -Monitor replenish as appropriate  Thrombocytopenia: Mild.  Likely due to #1.  Stable. -Continue monitoring  Leukocytosis/bandemia: Likely  due to #1.  Improving. -Antibiotics as above -Continue monitoring  Obesity Body mass index is 35.9 kg/m. -Encourage lifestyle change to lose weight          DVT prophylaxis:   enoxaparin (LOVENOX) injection 40 mg Start: 10/24/23 1145 SCDs Start: 10/23/23 0531  Code Status: Full code Family Communication: Severe sepsis due to complicated UTI Level of care: Med-Surg Status is: Inpatient The patient will remain inpatient because: Severe sepsis due to complicated UTI/pyelonephritis with obstructing left ureteral stone   Final disposition: Home Consultants:  Urology  55 minutes with more than 50% spent in reviewing records, counseling patient/family and coordinating care.   Sch Meds:  Scheduled Meds:  Chlorhexidine Gluconate Cloth  6 each Topical Daily   enoxaparin (LOVENOX) injection  40 mg Subcutaneous Q24H   Continuous Infusions:  [START ON 10/26/2023]  ceFAZolin (ANCEF) IV     potassium PHOSPHATE IVPB (in mmol) 30 mmol (10/25/23 1147)   PRN Meds:.acetaminophen **OR** acetaminophen, HYDROmorphone (DILAUDID) injection, ketorolac, naLOXone (NARCAN)  injection, ondansetron (ZOFRAN) IV, mouth rinse, oxyCODONE  Antimicrobials: Anti-infectives (From admission, onward)    Start     Dose/Rate Route Frequency Ordered Stop   10/26/23 0200  ceFAZolin (ANCEF) IVPB 1 g/50 mL premix        1 g 100 mL/hr over 30 Minutes Intravenous Every 8 hours 10/25/23 1013     10/24/23 0200  cefTRIAXone (ROCEPHIN) 1 g in sodium chloride 0.9 % 100 mL IVPB  Status:  Discontinued        1 g 200 mL/hr over 30 Minutes Intravenous Every 24 hours 10/23/23 0532 10/25/23 1013   10/23/23 1031  vancomycin (VANCOCIN) 1-5 GM/200ML-% IVPB       Note to Pharmacy: [name]: cabinet override      10/23/23 1031 10/23/23 2244   10/23/23 0445  cefTRIAXone (ROCEPHIN) 1 g in sodium chloride 0.9 % 100 mL IVPB        1 g 200 mL/hr over 30 Minutes Intravenous  Once 10/23/23 0444 10/23/23 0532        I have personally reviewed the following labs and images: CBC: Recent Labs  Lab 10/23/23 0121 10/24/23 0251 10/25/23 0754  WBC 14.4* 23.5* 11.7*  NEUTROABS 12.6* 20.3*  --   HGB  14.6 11.6* 12.2  HCT 44.3 35.3* 37.8  MCV 89.3 92.2 91.5  PLT 315 147* 140*   BMP &GFR Recent Labs  Lab 10/23/23 0121 10/24/23 0251 10/25/23 0754  NA 134* 140 133*  K 3.7 3.4* 3.4*  CL 104 112* 105  CO2 20* 20* 20*  GLUCOSE 181* 144* 115*  BUN 32* 26* 15  CREATININE 0.91 0.94 0.75  CALCIUM 9.4 7.7* 7.7*  MG 2.2 1.7 2.0  PHOS  --   --  1.5*   Estimated Creatinine Clearance: 96.3 mL/min (by C-G formula based on SCr of 0.75 mg/dL). Liver & Pancreas: Recent Labs  Lab 10/23/23 0121 10/24/23 0251 10/25/23 0754  AST 28 32  --   ALT 34 29  --   ALKPHOS 86 50  --   BILITOT 0.5 0.4  --   PROT 7.7 5.4*  --   ALBUMIN 4.5 2.9* 2.8*   Recent Labs  Lab 10/23/23 0121  LIPASE 31   No results for input(s): "AMMONIA" in the last 168 hours. Diabetic: Recent Labs    10/24/23 0251  HGBA1C 5.7*   No results for input(s): "GLUCAP" in the last 168 hours. Cardiac Enzymes: No results for input(s): "CKTOTAL", "CKMB", "CKMBINDEX", "  TROPONINI" in the last 168 hours. No results for input(s): "PROBNP" in the last 8760 hours. Coagulation Profile: No results for input(s): "INR", "PROTIME" in the last 168 hours. Thyroid Function Tests: No results for input(s): "TSH", "T4TOTAL", "FREET4", "T3FREE", "THYROIDAB" in the last 72 hours. Lipid Profile: No results for input(s): "CHOL", "HDL", "LDLCALC", "TRIG", "CHOLHDL", "LDLDIRECT" in the last 72 hours. Anemia Panel: No results for input(s): "VITAMINB12", "FOLATE", "FERRITIN", "TIBC", "IRON", "RETICCTPCT" in the last 72 hours. Urine analysis:    Component Value Date/Time   COLORURINE YELLOW 10/23/2023 0359   APPEARANCEUR HAZY (A) 10/23/2023 0359   LABSPEC 1.021 10/23/2023 0359   PHURINE 6.0 10/23/2023 0359   GLUCOSEU NEGATIVE 10/23/2023 0359   HGBUR NEGATIVE 10/23/2023 0359   BILIRUBINUR NEGATIVE 10/23/2023 0359   BILIRUBINUR negative 07/09/2022 0851   BILIRUBINUR neg 06/08/2015 1525   KETONESUR 5 (A) 10/23/2023 0359   PROTEINUR  NEGATIVE 10/23/2023 0359   UROBILINOGEN 0.2 07/09/2022 0851   UROBILINOGEN 0.2 10/17/2015 2047   NITRITE POSITIVE (A) 10/23/2023 0359   LEUKOCYTESUR SMALL (A) 10/23/2023 0359   Sepsis Labs: Invalid input(s): "PROCALCITONIN", "LACTICIDVEN"  Microbiology: Recent Results (from the past 240 hour(s))  Urine Culture     Status: Abnormal   Collection Time: 10/23/23  3:59 AM   Specimen: Urine, Clean Catch  Result Value Ref Range Status   Specimen Description   Final    URINE, CLEAN CATCH Performed at [facility], [address], [place], [place] [address]    Special Requests   Final    NONE Performed at [facility], [address], [place], [place] [address]    Culture >=100,000 COLONIES/mL ESCHERICHIA COLI (A)  Final   Report Status 10/25/2023 FINAL  Final   Organism ID, Bacteria ESCHERICHIA COLI (A)  Final      Susceptibility   Escherichia coli - MIC*    AMPICILLIN >=32 RESISTANT Resistant     CEFAZOLIN <=4 SENSITIVE Sensitive     CEFEPIME <=0.12 SENSITIVE Sensitive     CEFTRIAXONE <=0.25 SENSITIVE Sensitive     CIPROFLOXACIN 1 RESISTANT Resistant     GENTAMICIN <=1 SENSITIVE Sensitive     IMIPENEM <=0.25 SENSITIVE Sensitive     NITROFURANTOIN 32 SENSITIVE Sensitive     TRIMETH/SULFA <=20 SENSITIVE Sensitive     AMPICILLIN/SULBACTAM 16 INTERMEDIATE Intermediate     PIP/TAZO <=4 SENSITIVE Sensitive ug/mL    * >=100,000 COLONIES/mL ESCHERICHIA COLI  Culture, blood (Routine X 2) w Reflex to ID Panel     Status: None (Preliminary result)   Collection Time: 10/23/23  6:57 AM   Specimen: BLOOD  Result Value Ref Range Status   Specimen Description   Final    BLOOD SITE NOT SPECIFIED Performed at [facility], [address], [place], [place] [address]    Special Requests   Final    BOTTLES DRAWN AEROBIC AND ANAEROBIC Blood Culture results may not be optimal due to an inadequate volume of blood received in culture  bottles Performed at [facility], [address] [place], [place] [address]    Culture   Final    NO GROWTH 2 DAYS Performed at [facility], [address], [place], [place] [address]    Report Status PENDING  Incomplete  Culture, blood (Routine X 2) w Reflex to ID Panel     Status: None (Preliminary result)   Collection Time: 10/23/23 11:13 AM   Specimen: BLOOD  Result Value Ref Range Status  Specimen Description   Final    BLOOD BLOOD LEFT HAND Performed at [facility], [address] [place], [place] [address]    Special Requests   Final    AEROBIC BOTTLE ONLY Blood Culture adequate volume Performed at [facility], [address] [place], [place] [address]    Culture   Final    NO GROWTH 2 DAYS Performed at [facility], [address] [place], [place] [address]    Report Status PENDING  Incomplete  MRSA Next Gen by PCR, Nasal     Status: None   Collection Time: 10/23/23  5:50 PM   Specimen: Nasal Mucosa; Nasal Swab  Result Value Ref Range Status   MRSA by PCR Next Gen NOT DETECTED NOT DETECTED Final    Comment: (NOTE) The GeneXpert MRSA Assay (FDA approved for NASAL specimens only), is one component of a comprehensive MRSA colonization surveillance program. It is not intended to diagnose MRSA infection nor to guide or monitor treatment for MRSA infections. Test performance is not FDA approved in patients less than 32 years old. Performed at [facility], [address], [place], [place] [address]     Radiology Studies: No results found.    [facility] [facility]  If 7PM-7AM, please contact night-coverage [email or web address] 10/25/2023, 1:34 PM

## 2023-10-25 NOTE — Progress Notes (Signed)
Urology Inpatient Progress Note  Subjective: POD #2 s/p left dj stent for ureteral stone and urosepsis. Patient still with fever but curve trending down Leukocytosis also markedly improved  Urine culture growing ecoli- sensitivities pending On rocephin and augmentin  Anti-infectives: Anti-infectives (From admission, onward)    Start     Dose/Rate Route Frequency Ordered Stop   10/24/23 0200  cefTRIAXone (ROCEPHIN) 1 g in sodium chloride 0.9 % 100 mL IVPB        1 g 200 mL/hr over 30 Minutes Intravenous Every 24 hours 10/23/23 0532     10/23/23 1031  vancomycin (VANCOCIN) 1-5 GM/200ML-% IVPB       Note to Pharmacy: [name]: cabinet override      10/23/23 1031 10/23/23 2244   10/23/23 0445  cefTRIAXone (ROCEPHIN) 1 g in sodium chloride 0.9 % 100 mL IVPB        1 g 200 mL/hr over 30 Minutes Intravenous  Once 10/23/23 0444 10/23/23 0532       Current Facility-Administered Medications  Medication Dose Route Frequency Provider Last Rate Last Admin   acetaminophen (TYLENOL) tablet 650 mg  650 mg Oral Q6H PRN Almon Hercules, MD   650 mg at 10/25/23 0041   Or   acetaminophen (TYLENOL) suppository 650 mg  650 mg Rectal Q6H PRN Candelaria Stagers T, MD       cefTRIAXone (ROCEPHIN) 1 g in sodium chloride 0.9 % 100 mL IVPB  1 g Intravenous Q24H Candelaria Stagers T, MD 200 mL/hr at 10/25/23 0151 1 g at 10/25/23 0151   Chlorhexidine Gluconate Cloth 2 % PADS 6 each  6 each Topical Daily Candelaria Stagers T, MD   6 each at 10/24/23 1145   enoxaparin (LOVENOX) injection 40 mg  40 mg Subcutaneous Q24H Candelaria Stagers T, MD   40 mg at 10/24/23 1145   HYDROmorphone (DILAUDID) injection 1 mg  1 mg Intravenous Q2H PRN Candelaria Stagers T, MD   1 mg at 10/25/23 0733   ketorolac (TORADOL) 15 MG/ML injection 15 mg  15 mg Intravenous Q6H PRN Candelaria Stagers T, MD   15 mg at 10/25/23 0149   naloxone (NARCAN) injection 0.4 mg  0.4 mg Intravenous PRN Almon Hercules, MD       ondansetron (ZOFRAN) injection 4 mg  4 mg Intravenous Q6H  PRN Candelaria Stagers T, MD   4 mg at 10/25/23 0735   Oral care mouth rinse  15 mL Mouth Rinse PRN Candelaria Stagers T, MD         Objective: Vital signs in last 24 hours: Temp:  [98.3 F (36.8 C)-101.5 F (38.6 C)] 98.3 F (36.8 C) (11/16 0555) Pulse Rate:  [79-108] 105 (11/16 0555) Resp:  [12-18] 16 (11/16 0555) BP: (119-134)/(71-83) 134/83 (11/16 0555) SpO2:  [90 %-100 %] 90 % (11/16 0555) Weight:  [100.9 kg] 100.9 kg (11/16 0553)  Intake/Output from previous day: 11/15 0701 - 11/16 0700 In: 496 [I.V.:403.9; IV Piggyback:92.1] Out: 1750 [Urine:1750] Intake/Output this shift: No intake/output data recorded.  GENERAL APPEARANCE:  Well appearing, well developed, well nourished, NAD HEENT:  Atraumatic, normocephalic, oropharynx clear NECK:  Supple without lymphadenopathy or thyromegaly ABDOMEN:  Soft, non-tender, no masses EXTREMITIES:  Moves all extremities well, without clubbing, cyanosis, or edema NEUROLOGIC:  Alert and oriented x 3, normal gait, CN II-XII grossly intact MENTAL STATUS:  appropriate BACK:  Non-tender to palpation, No CVAT SKIN:  Warm, dry, and intact   Lab Results:  Recent Labs  10/24/23 0251 10/25/23 0754  WBC 23.5* 11.7*  HGB 11.6* 12.2  HCT 35.3* 37.8  PLT 147* 140*   BMET Recent Labs    10/24/23 0251 10/25/23 0754  NA 140 133*  K 3.4* 3.4*  CL 112* 105  CO2 20* 20*  GLUCOSE 144* 115*  BUN 26* 15  CREATININE 0.94 0.75  CALCIUM 7.7* 7.7*   PT/INR No results for input(s): "LABPROT", "INR" in the last 72 hours. ABG No results for input(s): "PHART", "HCO3" in the last 72 hours.  Invalid input(s): "PCO2", "PO2"  Studies/Results: DG C-Arm 1-60 Min-No Report  Result Date: 10/23/2023 Fluoroscopy was utilized by the requesting physician.  No radiographic interpretation.     Assessment & Plan: POD #2 s/p dj stent for stone/urosepsis Patient still with fever but curve trending down Leukocytosis also markedly improved  Urine culture growing  ecoli- sensitivities pending On rocephin and augmentin  Would leave Foley until she has not had Fever for min of 24 hrs.   Joline Maxcy, MD 10/25/2023

## 2023-10-26 DIAGNOSIS — A419 Sepsis, unspecified organism: Secondary | ICD-10-CM | POA: Diagnosis not present

## 2023-10-26 DIAGNOSIS — N201 Calculus of ureter: Secondary | ICD-10-CM | POA: Diagnosis not present

## 2023-10-26 DIAGNOSIS — Z8669 Personal history of other diseases of the nervous system and sense organs: Secondary | ICD-10-CM | POA: Diagnosis not present

## 2023-10-26 DIAGNOSIS — I700 Atherosclerosis of aorta: Secondary | ICD-10-CM | POA: Diagnosis not present

## 2023-10-26 LAB — CBC
HCT: 40.5 % (ref 36.0–46.0)
Hemoglobin: 13.1 g/dL (ref 12.0–15.0)
MCH: 29.4 pg (ref 26.0–34.0)
MCHC: 32.3 g/dL (ref 30.0–36.0)
MCV: 91 fL (ref 80.0–100.0)
Platelets: 178 10*3/uL (ref 150–400)
RBC: 4.45 MIL/uL (ref 3.87–5.11)
RDW: 13.4 % (ref 11.5–15.5)
WBC: 17.9 10*3/uL — ABNORMAL HIGH (ref 4.0–10.5)
nRBC: 0 % (ref 0.0–0.2)

## 2023-10-26 LAB — RENAL FUNCTION PANEL
Albumin: 2.9 g/dL — ABNORMAL LOW (ref 3.5–5.0)
Anion gap: 11 (ref 5–15)
BUN: 10 mg/dL (ref 6–20)
CO2: 21 mmol/L — ABNORMAL LOW (ref 22–32)
Calcium: 8.6 mg/dL — ABNORMAL LOW (ref 8.9–10.3)
Chloride: 106 mmol/L (ref 98–111)
Creatinine, Ser: 0.74 mg/dL (ref 0.44–1.00)
GFR, Estimated: >60 mL/min (ref >60)
Glucose, Bld: 106 mg/dL — ABNORMAL HIGH (ref 70–99)
Phosphorus: 2.2 mg/dL — ABNORMAL LOW (ref 2.5–4.6)
Potassium: 3.9 mmol/L (ref 3.5–5.1)
Sodium: 138 mmol/L (ref 135–145)

## 2023-10-26 LAB — MAGNESIUM: Magnesium: 2.2 mg/dL (ref 1.7–2.4)

## 2023-10-26 MED ORDER — CEFAZOLIN SODIUM-DEXTROSE 2-4 GM/100ML-% IV SOLN
2.0000 g | Freq: Three times a day (TID) | INTRAVENOUS | Status: DC
Start: 2023-10-26 — End: 2023-10-28
  Administered 2023-10-26 – 2023-10-28 (×6): 2 g via INTRAVENOUS
  Filled 2023-10-26 (×6): qty 100

## 2023-10-26 NOTE — Progress Notes (Signed)
PROGRESS NOTE  Patricia Erickson IHK:[id number] DOB: [birth year]   PCP: Marianne Sofia, PA-C  Patient is from: Home  DOA: 10/23/2023 LOS: 2  Chief complaints Chief Complaint  Patient presents with   Flank Pain     Brief Narrative / Interim history: 52 year old F with PMH of low back pain with radiculopathy, nephrolithiasis, migraine headache, diverticulosis and obesity presenting with left flank pain with associated nausea and vomiting and admitted with severe sepsis in the setting of complicated UTI with obstructing 5 mm left ureteral stone, mild left-sided hydroureteronephrosis and mild left perinephric inflammatory fat stranding.  Patient was febrile with tachycardia, leukocytosis and lactic acidosis.  UA concerning for UTI.  Resuscitated with IV fluid and started on IV ceftriaxone.  Urology consulted.  Patient underwent left ureteral stent placement on 11/14.  Blood cultures NGTD.  Urine culture with E. coli.  Antibiotic de-escalated to IV Ancef.  Fever curve downtrending but leukocytosis up after initial improvement.   Subjective: Seen and examined earlier this morning.  No major events overnight of this morning.  Febrile to 101 earlier this morning.  Slightly tachycardic as well.  She had heating pad that was taken off.  Reports improvement in abdominal pain.  She said her pain was about 3/10 and so she was moved in bed which made her pain to go up to 6/10.  She also reports associated nausea.  Objective: Vitals:   10/26/23 0255 10/26/23 0609 10/26/23 0745 10/26/23 1418  BP: 98/62 117/71 130/73 138/78  Pulse: 92 99 (!) 111 (!) 106  Resp: 14 14 18 16   Temp: 98.4 F (36.9 C) 99.3 F (37.4 C) (!) 101 F (38.3 C) 99.1 F (37.3 C)  TempSrc: Oral Oral Oral Oral  SpO2: 94% 95% 97% 96%  Weight:      Height:        Examination:  GENERAL: No apparent distress.  Nontoxic. HEENT: MMM.  Vision and hearing grossly intact.  NECK: Supple.  No apparent JVD.  RESP:  No IWOB.  Fair  aeration bilaterally. CVS:  RRR. Heart sounds normal.  ABD/GI/GU: BS+. Abd soft.  Tenderness across lower abdomen.  Foley catheter in place. MSK/EXT:  Moves extremities. No apparent deformity. No edema.  SKIN: no apparent skin lesion or wound NEURO: Awake, alert and oriented appropriately.  No apparent focal neuro deficit. PSYCH: Calm. Normal affect.   Procedures:  11/14-cystoscopy and left ureteral stent  Microbiology summarized: Blood cultures NGTD Urine culture with E. coli.  Assessment and plan: Severe sepsis due to complicated UTI and left pyelonephrosis in the setting of obstructing left ureteral stone: POA.  Had fever, tachycardia, leukocytosis and lactic acidosis on presentation.   Blood cultures NGTD.  Urine culture with E. coli.  Lactic acidosis resolved.  Hypotension resolved. Fever curve downtrending.  Leukocytosis up after initial improvement. -Ceftriaxone 11/14>> IV Ancef 11/16>> increase Ancef to 2 g given her weight.  Obstructive left ureteral stone with left hydroureteronephrosis -S/p cystoscopy and ureteral stent by urology -Urology recommended discontinuing Foley once afebrile for 24 hours.  She might not defervesce soon with pyelo -Outpatient follow-up with urology for definitive treatment  Low abdominal pain/tenderness: Likely due to #1 and 2.  KUB without significant finding.  Improving. -Pain control and bowel regimen -Pain control-continue IV Dilaudid and IV Toradol.  Added p.o. oxycodone.  Hypotension: Likely due to sepsis.  Resolved.  Hyperglycemia: Likely stress-induced.  No history of diabetes.  A1c 5.7%. -Monitor glucose with daily labs.   Aortic atherosclerosis: Noted on imaging. -  Outpatient follow-up with PCP   History of migraine headaches/chronic low back pain with radiculopathy: Stable -Continue home meds  Hypokalemia/hypophosphatemia:  -Monitor replenish as appropriate  Thrombocytopenia: Mild.  Likely due to #1.  Resolved. -Continue  monitoring  Leukocytosis/bandemia: Likely due to #1.  Slightly up after initial improvement. -Antibiotics as above -Continue monitoring  Obesity Body mass index is 35.9 kg/m. -Encourage lifestyle change to lose weight          DVT prophylaxis:  enoxaparin (LOVENOX) injection 40 mg Start: 10/24/23 1145 SCDs Start: 10/23/23 0531  Code Status: Full code Family Communication: Severe sepsis due to complicated UTI Level of care: Med-Surg Status is: Inpatient The patient will remain inpatient because: Severe sepsis due to complicated UTI/pyelonephritis with obstructing left ureteral stone   Final disposition: Home Consultants:  Urology  55 minutes with more than 50% spent in reviewing records, counseling patient/family and coordinating care.   Sch Meds:  Scheduled Meds:  Chlorhexidine Gluconate Cloth  6 each Topical Daily   enoxaparin (LOVENOX) injection  40 mg Subcutaneous Q24H   Continuous Infusions:   ceFAZolin (ANCEF) IV     PRN Meds:.acetaminophen **OR** acetaminophen, alum & mag hydroxide-simeth, diphenhydrAMINE, HYDROmorphone (DILAUDID) injection, ketorolac, naLOXone (NARCAN)  injection, ondansetron (ZOFRAN) IV, mouth rinse, oxyCODONE  Antimicrobials: Anti-infectives (From admission, onward)    Start     Dose/Rate Route Frequency Ordered Stop   10/26/23 1800  ceFAZolin (ANCEF) IVPB 2g/100 mL premix        2 g 200 mL/hr over 30 Minutes Intravenous Every 8 hours 10/26/23 1427     10/26/23 0200  ceFAZolin (ANCEF) IVPB 1 g/50 mL premix  Status:  Discontinued        1 g 100 mL/hr over 30 Minutes Intravenous Every 8 hours 10/25/23 1013 10/26/23 1427   10/24/23 0200  cefTRIAXone (ROCEPHIN) 1 g in sodium chloride 0.9 % 100 mL IVPB  Status:  Discontinued        1 g 200 mL/hr over 30 Minutes Intravenous Every 24 hours 10/23/23 0532 10/25/23 1013   10/23/23 1031  vancomycin (VANCOCIN) 1-5 GM/200ML-% IVPB       Note to Pharmacy: [name]: cabinet override       10/23/23 1031 10/23/23 2244   10/23/23 0445  cefTRIAXone (ROCEPHIN) 1 g in sodium chloride 0.9 % 100 mL IVPB        1 g 200 mL/hr over 30 Minutes Intravenous  Once 10/23/23 0444 10/23/23 0532        I have personally reviewed the following labs and images: CBC: Recent Labs  Lab 10/23/23 0121 10/24/23 0251 10/25/23 0754 10/26/23 1334  WBC 14.4* 23.5* 11.7* 17.9*  NEUTROABS 12.6* 20.3*  --   --   HGB 14.6 11.6* 12.2 13.1  HCT 44.3 35.3* 37.8 40.5  MCV 89.3 92.2 91.5 91.0  PLT 315 147* 140* 178   BMP &GFR Recent Labs  Lab 10/23/23 0121 10/24/23 0251 10/25/23 0754 10/26/23 1334  NA 134* 140 133* 138  K 3.7 3.4* 3.4* 3.9  CL 104 112* 105 106  CO2 20* 20* 20* 21*  GLUCOSE 181* 144* 115* 106*  BUN 32* 26* 15 10  CREATININE 0.91 0.94 0.75 0.74  CALCIUM 9.4 7.7* 7.7* 8.6*  MG 2.2 1.7 2.0 2.2  PHOS  --   --  1.5* 2.2*   Estimated Creatinine Clearance: 96.3 mL/min (by C-G formula based on SCr of 0.74 mg/dL). Liver & Pancreas: Recent Labs  Lab 10/23/23 0121 10/24/23 0251 10/25/23 0754  10/26/23 1334  AST 28 32  --   --   ALT 34 29  --   --   ALKPHOS 86 50  --   --   BILITOT 0.5 0.4  --   --   PROT 7.7 5.4*  --   --   ALBUMIN 4.5 2.9* 2.8* 2.9*   Recent Labs  Lab 10/23/23 0121  LIPASE 31   No results for input(s): "AMMONIA" in the last 168 hours. Diabetic: Recent Labs    10/24/23 0251  HGBA1C 5.7*   No results for input(s): "GLUCAP" in the last 168 hours. Cardiac Enzymes: No results for input(s): "CKTOTAL", "CKMB", "CKMBINDEX", "TROPONINI" in the last 168 hours. No results for input(s): "PROBNP" in the last 8760 hours. Coagulation Profile: No results for input(s): "INR", "PROTIME" in the last 168 hours. Thyroid Function Tests: No results for input(s): "TSH", "T4TOTAL", "FREET4", "T3FREE", "THYROIDAB" in the last 72 hours. Lipid Profile: No results for input(s): "CHOL", "HDL", "LDLCALC", "TRIG", "CHOLHDL", "LDLDIRECT" in the last 72 hours. Anemia  Panel: No results for input(s): "VITAMINB12", "FOLATE", "FERRITIN", "TIBC", "IRON", "RETICCTPCT" in the last 72 hours. Urine analysis:    Component Value Date/Time   COLORURINE YELLOW 10/23/2023 0359   APPEARANCEUR HAZY (A) 10/23/2023 0359   LABSPEC 1.021 10/23/2023 0359   PHURINE 6.0 10/23/2023 0359   GLUCOSEU NEGATIVE 10/23/2023 0359   HGBUR NEGATIVE 10/23/2023 0359   BILIRUBINUR NEGATIVE 10/23/2023 0359   BILIRUBINUR negative 07/09/2022 0851   BILIRUBINUR neg 06/08/2015 1525   KETONESUR 5 (A) 10/23/2023 0359   PROTEINUR NEGATIVE 10/23/2023 0359   UROBILINOGEN 0.2 07/09/2022 0851   UROBILINOGEN 0.2 10/17/2015 2047   NITRITE POSITIVE (A) 10/23/2023 0359   LEUKOCYTESUR SMALL (A) 10/23/2023 0359   Sepsis Labs: Invalid input(s): "PROCALCITONIN", "LACTICIDVEN"  Microbiology: Recent Results (from the past 240 hour(s))  Urine Culture     Status: Abnormal   Collection Time: 10/23/23  3:59 AM   Specimen: Urine, Clean Catch  Result Value Ref Range Status   Specimen Description   Final    URINE, CLEAN CATCH Performed at [facility], [address] [place], [place] [address]    Special Requests   Final    NONE Performed at [facility], [address], [place], [place] [address]    Culture >=100,000 COLONIES/mL ESCHERICHIA COLI (A)  Final   Report Status 10/25/2023 FINAL  Final   Organism ID, Bacteria ESCHERICHIA COLI (A)  Final      Susceptibility   Escherichia coli - MIC*    AMPICILLIN >=32 RESISTANT Resistant     CEFAZOLIN <=4 SENSITIVE Sensitive     CEFEPIME <=0.12 SENSITIVE Sensitive     CEFTRIAXONE <=0.25 SENSITIVE Sensitive     CIPROFLOXACIN 1 RESISTANT Resistant     GENTAMICIN <=1 SENSITIVE Sensitive     IMIPENEM <=0.25 SENSITIVE Sensitive     NITROFURANTOIN 32 SENSITIVE Sensitive     TRIMETH/SULFA <=20 SENSITIVE Sensitive     AMPICILLIN/SULBACTAM 16 INTERMEDIATE Intermediate     PIP/TAZO <=4 SENSITIVE Sensitive ug/mL    *  >=100,000 COLONIES/mL ESCHERICHIA COLI  Culture, blood (Routine X 2) w Reflex to ID Panel     Status: None (Preliminary result)   Collection Time: 10/23/23  6:57 AM   Specimen: BLOOD  Result Value Ref Range Status   Specimen Description   Final    BLOOD SITE NOT SPECIFIED Performed at [facility], [address] [place], [place] [address]    Special Requests  Final    BOTTLES DRAWN AEROBIC AND ANAEROBIC Blood Culture results may not be optimal due to an inadequate volume of blood received in culture bottles Performed at [facility], [address] [place], [place] [address]    Culture   Final    NO GROWTH 3 DAYS Performed at [facility], [address], [place], [place] [address]    Report Status PENDING  Incomplete  Culture, blood (Routine X 2) w Reflex to ID Panel     Status: None (Preliminary result)   Collection Time: 10/23/23 11:13 AM   Specimen: BLOOD  Result Value Ref Range Status   Specimen Description   Final    BLOOD BLOOD LEFT HAND Performed at [facility], [address], [place], [place] [address]    Special Requests   Final    AEROBIC BOTTLE ONLY Blood Culture adequate volume Performed at [facility], [address] [place], [place] [address]    Culture   Final    NO GROWTH 3 DAYS Performed at [facility], [address] [place], [place] [address]    Report Status PENDING  Incomplete  MRSA Next Gen by PCR, Nasal     Status: None   Collection Time: 10/23/23  5:50 PM   Specimen: Nasal Mucosa; Nasal Swab  Result Value Ref Range Status   MRSA by PCR Next Gen NOT DETECTED NOT DETECTED Final    Comment: (NOTE) The GeneXpert MRSA Assay (FDA approved for NASAL specimens only), is one component of a comprehensive MRSA colonization surveillance program. It is not intended to diagnose MRSA infection nor to guide or monitor treatment for MRSA infections. Test performance  is not FDA approved in patients less than 75 years old. Performed at [facility], [address], [place], [place] [address]     Radiology Studies: No results found.    Melita Villalona T. Cynthis [facility]  If 7PM-7AM, please contact night-coverage [email or web address] 10/26/2023, 4:42 PM

## 2023-10-26 NOTE — Plan of Care (Signed)

## 2023-10-26 NOTE — Plan of Care (Signed)
  Problem: Clinical Measurements: Goal: Diagnostic test results will improve Outcome: Progressing Goal: Signs and symptoms of infection will decrease Outcome: Progressing   Problem: Education: Goal: Knowledge of General Education information will improve Description: Including pain rating scale, medication(s)/side effects and non-pharmacologic comfort measures Outcome: Progressing   Problem: Health Behavior/Discharge Planning: Goal: Ability to manage health-related needs will improve Outcome: Progressing   Problem: Clinical Measurements: Goal: Ability to maintain clinical measurements within normal limits will improve Outcome: Progressing Goal: Will remain free from infection Outcome: Progressing Goal: Diagnostic test results will improve Outcome: Progressing   Problem: Elimination: Goal: Will not experience complications related to urinary retention Outcome: Progressing   Problem: Pain Management: Goal: General experience of comfort will improve Outcome: Progressing

## 2023-10-26 NOTE — Progress Notes (Signed)
Urology Inpatient Progress Note  Subjective: Patient continues to improve clinically. Tmax 100.6 Labs pending today  Anti-infectives: Anti-infectives (From admission, onward)    Start     Dose/Rate Route Frequency Ordered Stop   10/26/23 0200  ceFAZolin (ANCEF) IVPB 1 g/50 mL premix        1 g 100 mL/hr over 30 Minutes Intravenous Every 8 hours 10/25/23 1013     10/24/23 0200  cefTRIAXone (ROCEPHIN) 1 g in sodium chloride 0.9 % 100 mL IVPB  Status:  Discontinued        1 g 200 mL/hr over 30 Minutes Intravenous Every 24 hours 10/23/23 0532 10/25/23 1013   10/23/23 1031  vancomycin (VANCOCIN) 1-5 GM/200ML-% IVPB       Note to Pharmacy: [name]: cabinet override      10/23/23 1031 10/23/23 2244   10/23/23 0445  cefTRIAXone (ROCEPHIN) 1 g in sodium chloride 0.9 % 100 mL IVPB        1 g 200 mL/hr over 30 Minutes Intravenous  Once 10/23/23 0444 10/23/23 0532       Current Facility-Administered Medications  Medication Dose Route Frequency Provider Last Rate Last Admin   acetaminophen (TYLENOL) tablet 650 mg  650 mg Oral Q6H PRN Almon Hercules, MD   650 mg at 10/26/23 0813   Or   acetaminophen (TYLENOL) suppository 650 mg  650 mg Rectal Q6H PRN Almon Hercules, MD       alum & mag hydroxide-simeth (MAALOX/MYLANTA) 200-200-20 MG/5ML suspension 30 mL  30 mL Oral Q4H PRN Candelaria Stagers T, MD   30 mL at 10/25/23 2259   ceFAZolin (ANCEF) IVPB 1 g/50 mL premix  1 g Intravenous Q8H Gonfa, Taye T, MD 100 mL/hr at 10/26/23 1044 1 g at 10/26/23 1044   Chlorhexidine Gluconate Cloth 2 % PADS 6 each  6 each Topical Daily Almon Hercules, MD   6 each at 10/26/23 1046   diphenhydrAMINE (BENADRYL) capsule 25 mg  25 mg Oral Q8H PRN Candelaria Stagers T, MD   25 mg at 10/25/23 2040   enoxaparin (LOVENOX) injection 40 mg  40 mg Subcutaneous Q24H Candelaria Stagers T, MD   40 mg at 10/25/23 1143   HYDROmorphone (DILAUDID) injection 1 mg  1 mg Intravenous Q3H PRN Candelaria Stagers T, MD   1 mg at 10/25/23 2038   ketorolac  (TORADOL) 15 MG/ML injection 15 mg  15 mg Intravenous Q6H PRN Candelaria Stagers T, MD   15 mg at 10/25/23 2205   naloxone (NARCAN) injection 0.4 mg  0.4 mg Intravenous PRN Almon Hercules, MD       ondansetron (ZOFRAN) injection 4 mg  4 mg Intravenous Q6H PRN Candelaria Stagers T, MD   4 mg at 10/25/23 0735   Oral care mouth rinse  15 mL Mouth Rinse PRN Candelaria Stagers T, MD       oxyCODONE (Oxy IR/ROXICODONE) immediate release tablet 5 mg  5 mg Oral Q6H PRN Candelaria Stagers T, MD   5 mg at 10/26/23 1043     Objective: Vital signs in last 24 hours: Temp:  [97.8 F (36.6 C)-101 F (38.3 C)] 101 F (38.3 C) (11/17 0745) Pulse Rate:  [92-111] 111 (11/17 0745) Resp:  [14-18] 18 (11/17 0745) BP: (98-142)/(62-91) 130/73 (11/17 0745) SpO2:  [91 %-97 %] 97 % (11/17 0745)  Intake/Output from previous day: 11/16 0701 - 11/17 0700 In: 1137.8 [P.O.:480; IV Piggyback:657.8] Out: 2225 [Urine:2225] Intake/Output this shift: No intake/output data recorded.  GENERAL APPEARANCE:  Well appearing, well developed, well nourished, NAD    Lab Results:  Recent Labs    10/24/23 0251 10/25/23 0754  WBC 23.5* 11.7*  HGB 11.6* 12.2  HCT 35.3* 37.8  PLT 147* 140*   BMET Recent Labs    10/24/23 0251 10/25/23 0754  NA 140 133*  K 3.4* 3.4*  CL 112* 105  CO2 20* 20*  GLUCOSE 144* 115*  BUN 26* 15  CREATININE 0.94 0.75  CALCIUM 7.7* 7.7*   PT/INR No results for input(s): "LABPROT", "INR" in the last 72 hours. ABG No results for input(s): "PHART", "HCO3" in the last 72 hours.  Invalid input(s): "PCO2", "PO2"  Studies/Results: DG Abd Portable 1V  Result Date: 10/25/2023 CLINICAL DATA:  Abdominal pain. EXAM: PORTABLE ABDOMEN - 1 VIEW COMPARISON:  January 01, 2021 FINDINGS: The bowel gas pattern is normal. Left ureteral stent in place. No calculi are seen radiographically. Prior cholecystectomy. IMPRESSION: 1. Left ureteral stent in place. 2. No calculi are seen radiographically. Electronically Signed   By:  Ted Mcalpine M.D.   On: 10/25/2023 16:39     Assessment & Plan: POD #3 s/p dj stent for stone urosepsis (ecoli- quinolone and amp resistant) Can stop augmentin Transition to outpatient appropriate ceph at dc    Joline Maxcy, MD 10/26/2023

## 2023-10-27 DIAGNOSIS — Z8669 Personal history of other diseases of the nervous system and sense organs: Secondary | ICD-10-CM | POA: Diagnosis not present

## 2023-10-27 DIAGNOSIS — I700 Atherosclerosis of aorta: Secondary | ICD-10-CM | POA: Diagnosis not present

## 2023-10-27 DIAGNOSIS — N201 Calculus of ureter: Secondary | ICD-10-CM | POA: Diagnosis not present

## 2023-10-27 DIAGNOSIS — A419 Sepsis, unspecified organism: Secondary | ICD-10-CM | POA: Diagnosis not present

## 2023-10-27 LAB — CBC WITH DIFFERENTIAL/PLATELET
Abs Immature Granulocytes: 0.42 10*3/uL — ABNORMAL HIGH (ref 0.00–0.07)
Basophils Absolute: 0.1 10*3/uL (ref 0.0–0.1)
Basophils Relative: 0 %
Eosinophils Absolute: 0 10*3/uL (ref 0.0–0.5)
Eosinophils Relative: 0 %
HCT: 39.6 % (ref 36.0–46.0)
Hemoglobin: 12.8 g/dL (ref 12.0–15.0)
Immature Granulocytes: 3 %
Lymphocytes Relative: 7 %
Lymphs Abs: 1.2 10*3/uL (ref 0.7–4.0)
MCH: 29.2 pg (ref 26.0–34.0)
MCHC: 32.3 g/dL (ref 30.0–36.0)
MCV: 90.2 fL (ref 80.0–100.0)
Monocytes Absolute: 1.5 10*3/uL — ABNORMAL HIGH (ref 0.1–1.0)
Monocytes Relative: 9 %
Neutro Abs: 13.7 10*3/uL — ABNORMAL HIGH (ref 1.7–7.7)
Neutrophils Relative %: 81 %
Platelets: 223 10*3/uL (ref 150–400)
RBC: 4.39 MIL/uL (ref 3.87–5.11)
RDW: 13.3 % (ref 11.5–15.5)
WBC: 17 10*3/uL — ABNORMAL HIGH (ref 4.0–10.5)
nRBC: 0 % (ref 0.0–0.2)

## 2023-10-27 LAB — RENAL FUNCTION PANEL
Albumin: 2.5 g/dL — ABNORMAL LOW (ref 3.5–5.0)
Anion gap: 9 (ref 5–15)
BUN: 11 mg/dL (ref 6–20)
CO2: 22 mmol/L (ref 22–32)
Calcium: 8.3 mg/dL — ABNORMAL LOW (ref 8.9–10.3)
Chloride: 105 mmol/L (ref 98–111)
Creatinine, Ser: 0.72 mg/dL (ref 0.44–1.00)
GFR, Estimated: >60 mL/min (ref >60)
Glucose, Bld: 125 mg/dL — ABNORMAL HIGH (ref 70–99)
Phosphorus: 2.1 mg/dL — ABNORMAL LOW (ref 2.5–4.6)
Potassium: 3.9 mmol/L (ref 3.5–5.1)
Sodium: 136 mmol/L (ref 135–145)

## 2023-10-27 LAB — MAGNESIUM: Magnesium: 2.1 mg/dL (ref 1.7–2.4)

## 2023-10-27 MED ORDER — HYDROMORPHONE HCL 1 MG/ML IJ SOLN
0.5000 mg | INTRAMUSCULAR | Status: DC | PRN
  Administered 2023-10-27 – 2023-10-28 (×2): 0.5 mg via INTRAVENOUS
  Filled 2023-10-27 (×2): qty 0.5

## 2023-10-27 NOTE — Progress Notes (Signed)
PROGRESS NOTE  Patricia Erickson GUY:[id number] DOB: 12-Feb-1971   PCP: Marianne Sofia, PA-C  Patient is from: Home  DOA: 10/23/2023 LOS: 3  Chief complaints Chief Complaint  Patient presents with   Flank Pain     Brief Narrative / Interim history: 52 year old F with PMH of low back pain with radiculopathy, nephrolithiasis, migraine headache, diverticulosis and obesity presenting with left flank pain with associated nausea and vomiting and admitted with severe sepsis in the setting of complicated UTI with obstructing 5 mm left ureteral stone, mild left-sided hydroureteronephrosis and mild left perinephric inflammatory fat stranding.  Patient was febrile with tachycardia, leukocytosis and lactic acidosis.  UA concerning for UTI.  Resuscitated with IV fluid and started on IV ceftriaxone.  Urology consulted.  Patient underwent left ureteral stent placement on 11/14.  Blood cultures NGTD.  Urine culture with E. coli.  Antibiotic de-escalated to IV Ancef.  Fever curve downtrending but leukocytosis persisted after initial improvement.  Subjective: Seen and examined earlier this morning.  No major events overnight of this morning.  Continues to endorse abdominal pain.  Pain is worse when she tries to pull herself out of bed holding to bed rails. Not much with walking.  Denies UTI symptoms.  Had a small bowel movement this morning.  Last fever about 8 AM yesterday.  No nausea or vomiting.  Objective: Vitals:   10/26/23 2025 10/27/23 0425 10/27/23 0500 10/27/23 1215  BP: 133/75 115/79  (!) 147/82  Pulse: (!) 102 90  (!) 104  Resp: 18 18  18   Temp: 99.4 F (37.4 C) 98.4 F (36.9 C)  99.3 F (37.4 C)  TempSrc: Oral Oral    SpO2: 97% 98%  98%  Weight:   102.6 kg   Height:        Examination:  GENERAL: No apparent distress.  Nontoxic. HEENT: MMM.  Vision and hearing grossly intact.  NECK: Supple.  No apparent JVD.  RESP:  No IWOB.  Fair aeration bilaterally. CVS:  RRR. Heart sounds  normal.  ABD/GI/GU: BS+. Abd soft.  Mild abdominal tenderness.  Foley catheter in place. MSK/EXT:  Moves extremities. No apparent deformity. No edema.  SKIN: no apparent skin lesion or wound NEURO: Awake, alert and oriented appropriately.  No apparent focal neuro deficit. PSYCH: Calm. Normal affect.   Procedures:  11/14-cystoscopy and left ureteral stent  Microbiology summarized: Blood cultures NGTD Urine culture with E. coli.  Assessment and plan: Severe sepsis due to complicated UTI and left pyelonephrosis in the setting of obstructing left ureteral stone: POA.  Had fever, tachycardia, leukocytosis and lactic acidosis on presentation.   Blood cultures NGTD.  Urine culture with E. coli.  Lactic acidosis resolved.  Hypotension resolved.  Last fever at 8 AM on 11/17.  Leukocytosis up after initial improvement. -Ceftriaxone 11/14>> IV Ancef 11/16>> increase Ancef to 2 g given her weight.  Obstructive left ureteral stone with left hydroureteronephrosis -S/p cystoscopy and ureteral stent by urology -Urology recommended discontinuing Foley once afebrile for 24 hours.  Voiding trial today -Outpatient follow-up with urology for definitive treatment  Low abdominal pain/tenderness: Likely due to #1 and 2.  KUB without significant finding.  Seems to be using Dilaudid around-the-clock.  Used few doses of oxycodone.  Pain improved.  -Decrease IV Dilaudid to 0.5 mg every 3 hours as needed pain -Continue oxycodone and Tylenol as needed  Hypotension: Likely due to sepsis.  Resolved.  Hyperglycemia: Likely stress-induced.  No history of diabetes.  A1c 5.7%. -Monitor glucose with daily  labs.   Aortic atherosclerosis: Noted on imaging. -Outpatient follow-up with PCP   History of migraine headaches/chronic low back pain with radiculopathy: Stable -Continue home meds  Hypokalemia/hypophosphatemia:  -Monitor replenish as appropriate  Thrombocytopenia: Mild.  Likely due to #1.   Resolved. -Continue monitoring  Leukocytosis/bandemia: Likely due to #1.  Slightly up after initial improvement. -Antibiotics as above -Continue monitoring  Obesity Body mass index is 36.51 kg/m. -Encourage lifestyle change to lose weight          DVT prophylaxis:  enoxaparin (LOVENOX) injection 40 mg Start: 10/24/23 1145 SCDs Start: 10/23/23 0531  Code Status: Full code Family Communication: Severe sepsis due to complicated UTI Level of care: Med-Surg Status is: Inpatient The patient will remain inpatient because: Severe sepsis due to complicated UTI/pyelonephritis with obstructing left ureteral stone   Final disposition: Home Consultants:  Urology  35 minutes with more than 50% spent in reviewing records, counseling patient/family and coordinating care.   Sch Meds:  Scheduled Meds:  Chlorhexidine Gluconate Cloth  6 each Topical Daily   enoxaparin (LOVENOX) injection  40 mg Subcutaneous Q24H   Continuous Infusions:   ceFAZolin (ANCEF) IV 2 g (10/27/23 0952)   PRN Meds:.acetaminophen **OR** acetaminophen, alum & mag hydroxide-simeth, diphenhydrAMINE, HYDROmorphone (DILAUDID) injection, ketorolac, naLOXone (NARCAN)  injection, ondansetron (ZOFRAN) IV, mouth rinse, oxyCODONE  Antimicrobials: Anti-infectives (From admission, onward)    Start     Dose/Rate Route Frequency Ordered Stop   10/26/23 1800  ceFAZolin (ANCEF) IVPB 2g/100 mL premix        2 g 200 mL/hr over 30 Minutes Intravenous Every 8 hours 10/26/23 1427     10/26/23 0200  ceFAZolin (ANCEF) IVPB 1 g/50 mL premix  Status:  Discontinued        1 g 100 mL/hr over 30 Minutes Intravenous Every 8 hours 10/25/23 1013 10/26/23 1427   10/24/23 0200  cefTRIAXone (ROCEPHIN) 1 g in sodium chloride 0.9 % 100 mL IVPB  Status:  Discontinued        1 g 200 mL/hr over 30 Minutes Intravenous Every 24 hours 10/23/23 0532 10/25/23 1013   10/23/23 1031  vancomycin (VANCOCIN) 1-5 GM/200ML-% IVPB       Note to Pharmacy:  [name]: cabinet override      10/23/23 1031 10/23/23 2244   10/23/23 0445  cefTRIAXone (ROCEPHIN) 1 g in sodium chloride 0.9 % 100 mL IVPB        1 g 200 mL/hr over 30 Minutes Intravenous  Once 10/23/23 0444 10/23/23 0532        I have personally reviewed the following labs and images: CBC: Recent Labs  Lab 10/23/23 0121 10/24/23 0251 10/25/23 0754 10/26/23 1334 10/27/23 0810  WBC 14.4* 23.5* 11.7* 17.9* 17.0*  NEUTROABS 12.6* 20.3*  --   --  13.7*  HGB 14.6 11.6* 12.2 13.1 12.8  HCT 44.3 35.3* 37.8 40.5 39.6  MCV 89.3 92.2 91.5 91.0 90.2  PLT 315 147* 140* 178 223   BMP &GFR Recent Labs  Lab 10/23/23 0121 10/24/23 0251 10/25/23 0754 10/26/23 1334 10/27/23 0810  NA 134* 140 133* 138 136  K 3.7 3.4* 3.4* 3.9 3.9  CL 104 112* 105 106 105  CO2 20* 20* 20* 21* 22  GLUCOSE 181* 144* 115* 106* 125*  BUN 32* 26* 15 10 11   CREATININE 0.91 0.94 0.75 0.74 0.72  CALCIUM 9.4 7.7* 7.7* 8.6* 8.3*  MG 2.2 1.7 2.0 2.2 2.1  PHOS  --   --  1.5* 2.2*  2.1*   Estimated Creatinine Clearance: 97.2 mL/min (by C-G formula based on SCr of 0.72 mg/dL). Liver & Pancreas: Recent Labs  Lab 10/23/23 0121 10/24/23 0251 10/25/23 0754 10/26/23 1334 10/27/23 0810  AST 28 32  --   --   --   ALT 34 29  --   --   --   ALKPHOS 86 50  --   --   --   BILITOT 0.5 0.4  --   --   --   PROT 7.7 5.4*  --   --   --   ALBUMIN 4.5 2.9* 2.8* 2.9* 2.5*   Recent Labs  Lab 10/23/23 0121  LIPASE 31   No results for input(s): "AMMONIA" in the last 168 hours. Diabetic: No results for input(s): "HGBA1C" in the last 72 hours.  No results for input(s): "GLUCAP" in the last 168 hours. Cardiac Enzymes: No results for input(s): "CKTOTAL", "CKMB", "CKMBINDEX", "TROPONINI" in the last 168 hours. No results for input(s): "PROBNP" in the last 8760 hours. Coagulation Profile: No results for input(s): "INR", "PROTIME" in the last 168 hours. Thyroid Function Tests: No results for input(s): "TSH",  "T4TOTAL", "FREET4", "T3FREE", "THYROIDAB" in the last 72 hours. Lipid Profile: No results for input(s): "CHOL", "HDL", "LDLCALC", "TRIG", "CHOLHDL", "LDLDIRECT" in the last 72 hours. Anemia Panel: No results for input(s): "VITAMINB12", "FOLATE", "FERRITIN", "TIBC", "IRON", "RETICCTPCT" in the last 72 hours. Urine analysis:    Component Value Date/Time   COLORURINE YELLOW 10/23/2023 0359   APPEARANCEUR HAZY (A) 10/23/2023 0359   LABSPEC 1.021 10/23/2023 0359   PHURINE 6.0 10/23/2023 0359   GLUCOSEU NEGATIVE 10/23/2023 0359   HGBUR NEGATIVE 10/23/2023 0359   BILIRUBINUR NEGATIVE 10/23/2023 0359   BILIRUBINUR negative 07/09/2022 0851   BILIRUBINUR neg 06/08/2015 1525   KETONESUR 5 (A) 10/23/2023 0359   PROTEINUR NEGATIVE 10/23/2023 0359   UROBILINOGEN 0.2 07/09/2022 0851   UROBILINOGEN 0.2 10/17/2015 2047   NITRITE POSITIVE (A) 10/23/2023 0359   LEUKOCYTESUR SMALL (A) 10/23/2023 0359   Sepsis Labs: Invalid input(s): "PROCALCITONIN", "LACTICIDVEN"  Microbiology: Recent Results (from the past 240 hour(s))  Urine Culture     Status: Abnormal   Collection Time: 10/23/23  3:59 AM   Specimen: Urine, Clean Catch  Result Value Ref Range Status   Specimen Description   Final    URINE, CLEAN CATCH Performed at [facility], [address] [place], [place] [address]    Special Requests   Final    NONE Performed at [facility], [address] [place], [place] [address]    Culture >=100,000 COLONIES/mL ESCHERICHIA COLI (A)  Final   Report Status 10/25/2023 FINAL  Final   Organism ID, Bacteria ESCHERICHIA COLI (A)  Final      Susceptibility   Escherichia coli - MIC*    AMPICILLIN >=32 RESISTANT Resistant     CEFAZOLIN <=4 SENSITIVE Sensitive     CEFEPIME <=0.12 SENSITIVE Sensitive     CEFTRIAXONE <=0.25 SENSITIVE Sensitive     CIPROFLOXACIN 1 RESISTANT Resistant     GENTAMICIN <=1 SENSITIVE Sensitive     IMIPENEM <=0.25 SENSITIVE Sensitive      NITROFURANTOIN 32 SENSITIVE Sensitive     TRIMETH/SULFA <=20 SENSITIVE Sensitive     AMPICILLIN/SULBACTAM 16 INTERMEDIATE Intermediate     PIP/TAZO <=4 SENSITIVE Sensitive ug/mL    * >=100,000 COLONIES/mL ESCHERICHIA COLI  Culture, blood (Routine X 2) w Reflex to ID Panel     Status: None (Preliminary result)   Collection  Time: 10/23/23  6:57 AM   Specimen: BLOOD  Result Value Ref Range Status   Specimen Description   Final    BLOOD SITE NOT SPECIFIED Performed at [facility], [address], [place], [place] [address]    Special Requests   Final    BOTTLES DRAWN AEROBIC AND ANAEROBIC Blood Culture results may not be optimal due to an inadequate volume of blood received in culture bottles Performed at [facility], [address], [place], [place] [address]    Culture   Final    NO GROWTH 4 DAYS Performed at [facility], [address] [place], [place] [address]    Report Status PENDING  Incomplete  Culture, blood (Routine X 2) w Reflex to ID Panel     Status: None (Preliminary result)   Collection Time: 10/23/23 11:13 AM   Specimen: BLOOD  Result Value Ref Range Status   Specimen Description   Final    BLOOD BLOOD LEFT HAND Performed at [facility], [address] [place], [place] [address]    Special Requests   Final    AEROBIC BOTTLE ONLY Blood Culture adequate volume Performed at [facility], [address] [place], [place] [address]    Culture   Final    NO GROWTH 4 DAYS Performed at [facility], [address] [place], [place] [address]    Report Status PENDING  Incomplete  MRSA Next Gen by PCR, Nasal     Status: None   Collection Time: 10/23/23  5:50 PM   Specimen: Nasal Mucosa; Nasal Swab  Result Value Ref Range Status   MRSA by PCR Next Gen NOT DETECTED NOT DETECTED Final    Comment: (NOTE) The GeneXpert MRSA Assay (FDA approved for NASAL specimens only), is one  component of a comprehensive MRSA colonization surveillance program. It is not intended to diagnose MRSA infection nor to guide or monitor treatment for MRSA infections. Test performance is not FDA approved in patients less than 71 years old. Performed at [facility], [address], [place], [place] [address]     Radiology Studies: No results found.    Naftula Donahue T. Dartha Rozzell [facility]  If 7PM-7AM, please contact night-coverage [email or web address] 10/27/2023, 3:13 PM

## 2023-10-27 NOTE — Progress Notes (Signed)
   10/27/23 1457  TOC Brief Assessment  Patient has primary care physician Yes  Home environment has been reviewed Single home, alone  Prior level of function: Independent  Prior/Current Home Services No current home services  Social Determinants of Health Reivew SDOH reviewed no interventions necessary  Readmission risk has been reviewed No  Transition of care needs no transition of care needs at this time

## 2023-10-27 NOTE — Plan of Care (Signed)
  Problem: Education: Goal: Knowledge of General Education information will improve Description: Including pain rating scale, medication(s)/side effects and non-pharmacologic comfort measures Outcome: Progressing   Problem: Health Behavior/Discharge Planning: Goal: Ability to manage health-related needs will improve Outcome: Progressing   Problem: Activity: Goal: Risk for activity intolerance will decrease Outcome: Progressing   Problem: Nutrition: Goal: Adequate nutrition will be maintained Outcome: Progressing   Problem: Coping: Goal: Level of anxiety will decrease Outcome: Progressing   Problem: Elimination: Goal: Will not experience complications related to bowel motility Outcome: Progressing   Problem: Pain Management: Goal: General experience of comfort will improve Outcome: Progressing

## 2023-10-28 DIAGNOSIS — N1330 Unspecified hydronephrosis: Secondary | ICD-10-CM | POA: Diagnosis not present

## 2023-10-28 DIAGNOSIS — N134 Hydroureter: Secondary | ICD-10-CM | POA: Diagnosis not present

## 2023-10-28 DIAGNOSIS — N201 Calculus of ureter: Secondary | ICD-10-CM | POA: Diagnosis not present

## 2023-10-28 DIAGNOSIS — Z8669 Personal history of other diseases of the nervous system and sense organs: Secondary | ICD-10-CM | POA: Diagnosis not present

## 2023-10-28 DIAGNOSIS — R6520 Severe sepsis without septic shock: Secondary | ICD-10-CM

## 2023-10-28 LAB — CBC
HCT: 38.1 % (ref 36.0–46.0)
Hemoglobin: 12.2 g/dL (ref 12.0–15.0)
MCH: 28.8 pg (ref 26.0–34.0)
MCHC: 32 g/dL (ref 30.0–36.0)
MCV: 90.1 fL (ref 80.0–100.0)
Platelets: 233 10*3/uL (ref 150–400)
RBC: 4.23 MIL/uL (ref 3.87–5.11)
RDW: 13.4 % (ref 11.5–15.5)
WBC: 15.4 10*3/uL — ABNORMAL HIGH (ref 4.0–10.5)
nRBC: 0 % (ref 0.0–0.2)

## 2023-10-28 LAB — CULTURE, BLOOD (ROUTINE X 2)
Culture: NO GROWTH
Culture: NO GROWTH
Special Requests: ADEQUATE

## 2023-10-28 MED ORDER — METOCLOPRAMIDE HCL 5 MG PO TABS: 5.0000 mg | ORAL_TABLET | Freq: Four times a day (QID) | ORAL | 0 refills | Status: DC | PRN

## 2023-10-28 MED ORDER — OXYCODONE HCL 5 MG PO TABS: 5.0000 mg | ORAL_TABLET | Freq: Three times a day (TID) | ORAL | 0 refills | Status: AC | PRN

## 2023-10-28 MED ORDER — CEFADROXIL 500 MG PO CAPS: 1000.0000 mg | ORAL_CAPSULE | Freq: Two times a day (BID) | ORAL | 0 refills | Status: AC

## 2023-10-28 NOTE — Discharge Summary (Signed)
Physician Discharge Summary  Patricia Erickson BJY:[id number] DOB: January 25, 1971 DOA: 10/23/2023  PCP: Marianne Sofia, PA-C  Admit date: 10/23/2023 Discharge date: 10/28/2023 Admitted From: Home Disposition: Home Recommendations for Outpatient Follow-up:  Outpatient follow-up with PCP and urology as below Check CMP and CBC at follow-up Please follow up on the following pending results: None  Home Health: Not indicated Equipment/Devices: Not indicated  Discharge Condition: Stable CODE STATUS: Full code  Follow-up Information     Noel Christmas, MD. Schedule an appointment as soon as possible for a visit .   Specialty: Urology Contact information: [address] [place] [address] [phone]         Marianne Sofia, PA-C. Schedule an appointment as soon as possible for a visit in 1 week(s).   Specialty: Physician Assistant Contact information: [address] [place] [address] [phone]                 Hospital course 52 year old F with PMH of low back pain with radiculopathy, nephrolithiasis, migraine headache, diverticulosis and obesity presenting with left flank pain with associated nausea and vomiting and admitted with severe sepsis in the setting of complicated UTI with obstructing 5 mm left ureteral stone, mild left-sided hydroureteronephrosis and mild left perinephric inflammatory fat stranding.  Patient was febrile with tachycardia, leukocytosis and lactic acidosis.  UA concerning for UTI.  Resuscitated with IV fluid and started on IV ceftriaxone.  Urology consulted.   Patient underwent left ureteral stent placement on 11/14.  Blood cultures NGTD.  Urine culture with E. coli.  Antibiotic de-escalated to IV Ancef.  Fever resolved.  Leukocytosis improved.  Discharged on p.o. cefadroxil for 8 more days to complete a total of 14 days course.  Outpatient follow-up with urology for definitive treatment of ureteral stone.  See individual problem  list below for more.   Problems addressed during this hospitalization Severe sepsis due to complicated UTI and left pyelonephrosis in the setting of obstructing left ureteral stone: POA.  Had fever, tachycardia, leukocytosis and lactic acidosis on presentation.   Blood cultures NGTD.  Urine culture with E. coli.  Lactic acidosis resolved.  Hypotension resolved.  Afebrile for 48 hours.  Leukocytosis improved. -Ceftriaxone 11/14>> IV Ancef 11/16>> cefadroxil 11/19-11/26 -Outpatient follow-up with PCP and urology as above -Check CBC and CMP at follow-up   Obstructive left ureteral stone with left hydroureteronephrosis -S/p cystoscopy and ureteral stent by urology -Passed voiding trial on 11/18 -Outpatient follow-up with urology for definitive treatment of ureteral stone   Low abdominal pain/tenderness: Likely due to #1 and 2.  KUB without significant finding.  Having regular bowel movements.  Voiding without problem.  Unclear how much of this is acute. -Tylenol and oxycodone as needed -Assess for underlying mood disorder.    Hypotension: Likely due to sepsis.  Resolved.   Hyperglycemia: Likely stress-induced.  No history of diabetes.  A1c 5.7%.   Aortic atherosclerosis: Noted on imaging. -Outpatient follow-up with PCP   History of migraine headaches/chronic low back pain with radiculopathy: Stable -Continue home meds   Hypokalemia/hypophosphatemia: Resolved   Thrombocytopenia: Mild.  Likely due to #1.  Resolved.   Leukocytosis/bandemia: Likely due to #1.  Improved. -Antibiotics as above -Recheck CBC at follow-up   Obesity Body mass index is 36.51 kg/m.            Time spent 35 minutes  Vital signs Vitals:   10/27/23 0500 10/27/23 1215 10/27/23 2018 10/28/23 0631  BP:  Marland Kitchen)  147/82 129/76 125/78  Pulse:  (!) 104 98 93  Temp:  99.3 F (37.4 C) 99 F (37.2 C) 98.2 F (36.8 C)  Resp:  18 19 18   Height:      Weight: 102.6 kg     SpO2:  98% 96% 98%  TempSrc:   Oral  Oral  BMI (Calculated): 36.53        Discharge exam  GENERAL: No apparent distress.  Nontoxic. HEENT: MMM.  Vision and hearing grossly intact.  NECK: Supple.  No apparent JVD.  RESP:  No IWOB.  Fair aeration bilaterally. CVS:  RRR. Heart sounds normal.  ABD/GI/GU: BS+. Abd soft.  Mild diffuse tenderness across lower abdomen.  No rebound or guarding. MSK/EXT:  Moves extremities. No apparent deformity. No edema.  SKIN: no apparent skin lesion or wound NEURO: Awake and alert. Oriented appropriately.  No apparent focal neuro deficit. PSYCH: Calm. Normal affect.   Discharge Instructions Discharge Instructions     Diet general   Complete by: As directed    Discharge instructions   Complete by: As directed    It has been a pleasure taking care of you!  You were hospitalized due to kidney stone and infection for which you have been treated with stent and antibiotics.  Your symptoms improved.  We are discharging you more antibiotics to complete treatment course.  It is very important that you take the whole course of antibiotics regardless of improvement.  Follow-up with your urologist for further management of kidney stone per their recommendation.   Take care,   Increase activity slowly   Complete by: As directed       Allergies as of 10/28/2023       Reactions   Propoxyphene N-acetaminophen Nausea And Vomiting        Medication List     STOP taking these medications    amoxicillin-clavulanate 875-125 MG tablet Commonly known as: AUGMENTIN   naproxen 375 MG tablet Commonly known as: NAPROSYN       TAKE these medications    cefadroxil 500 MG capsule Commonly known as: DURICEF Take 2 capsules (1,000 mg total) by mouth 2 (two) times daily for 8 days.   COLLAGEN PO Take 1 capsule by mouth daily.   FIBER PO Take 1 Scoop by mouth See admin instructions. Mix 1 scoopful into 4-6 ounces of a desired beverage and drink by mouth once a day   fluconazole 150 MG  tablet Commonly known as: DIFLUCAN Take 1 tablet (150 mg total) by mouth once as needed for up to 1 dose (after finishing abx).   metoCLOPramide 5 MG tablet Commonly known as: Reglan Take 1 tablet (5 mg total) by mouth every 6 (six) hours as needed for up to 5 days for nausea or vomiting.   Mirena (52 MG) 20 MCG/DAY Iud Generic drug: levonorgestrel 1 each by Intrauterine route See admin instructions. Place 1 new device in the uterus every 8 years   ondansetron 4 MG disintegrating tablet Commonly known as: ZOFRAN-ODT Take 1 tablet (4 mg total) by mouth every 8 (eight) hours as needed for nausea or vomiting. What changed: reasons to take this   oxyCODONE 5 MG immediate release tablet Commonly known as: Roxicodone Take 1 tablet (5 mg total) by mouth every 8 (eight) hours as needed for up to 3 days.   PROBIOTIC-PREBIOTIC PO Take 3.6 g by mouth daily.   topiramate 200 MG tablet Commonly known as: TOPAMAX Take 100 mg by mouth at bedtime.  Uro-MP 118 MG Caps Take 1 capsule by mouth 3 (three) times daily as needed (for bladder irritation).        Consultations: Urology  Procedures/Studies: 11/14-cystoscopy and left ureteral stent    DG Abd Portable 1V  Result Date: 10/25/2023 CLINICAL DATA:  Abdominal pain. EXAM: PORTABLE ABDOMEN - 1 VIEW COMPARISON:  January 01, 2021 FINDINGS: The bowel gas pattern is normal. Left ureteral stent in place. No calculi are seen radiographically. Prior cholecystectomy. IMPRESSION: 1. Left ureteral stent in place. 2. No calculi are seen radiographically. Electronically Signed   By: Ted Mcalpine M.D.   On: 10/25/2023 16:39   DG C-Arm 1-60 Min-No Report  Result Date: 10/23/2023 Fluoroscopy was utilized by the requesting physician.  No radiographic interpretation.   CT Renal Stone Study  Result Date: 10/23/2023 CLINICAL DATA:  Left flank pain. EXAM: CT ABDOMEN AND PELVIS WITHOUT CONTRAST TECHNIQUE: Multidetector CT imaging of the  abdomen and pelvis was performed following the standard protocol without IV contrast. RADIATION DOSE REDUCTION: This exam was performed according to the departmental dose-optimization program which includes automated exposure control, adjustment of the mA and/or kV according to patient size and/or use of iterative reconstruction technique. COMPARISON:  June 03, 2023 FINDINGS: Lower chest: Mild atelectasis is seen within the posterior aspects of the bilateral lung bases. Hepatobiliary: No focal liver abnormality is seen. Status post cholecystectomy. No biliary dilatation. Pancreas: Unremarkable. No pancreatic ductal dilatation or surrounding inflammatory changes. Spleen: Normal in size without focal abnormality. Adrenals/Urinary Tract: Adrenal glands are unremarkable. There is mild diffuse enlargement of the left kidney without evidence of focal lesions. 2 mm and 3 mm nonobstructing renal calculi are seen within the left kidney. A 5 mm obstructing renal calculus is seen within the mid to distal left ureter, with mild left-sided hydronephrosis and hydroureter. Mild left perinephric inflammatory fat stranding is also seen. Bladder is unremarkable. Stomach/Bowel: Stomach is within normal limits. Appendix appears normal. No evidence of bowel wall thickening, distention, or inflammatory changes. Noninflamed diverticula are seen throughout the descending and sigmoid colon. Vascular/Lymphatic: Aortic atherosclerosis. No enlarged abdominal or pelvic lymph nodes. Reproductive: A properly positioned IUD is seen within an otherwise normal appearing uterus. The bilateral adnexa are unremarkable. Other: No abdominal wall hernia or abnormality. No abdominopelvic ascites. Musculoskeletal: No acute or significant osseous findings. IMPRESSION: 1. 5 mm obstructing renal calculus within the mid to distal left ureter. 2. 2 mm and 3 mm nonobstructing left renal calculi. 3. Colonic diverticulosis. 4. Evidence of prior cholecystectomy. 5.  Aortic atherosclerosis. Aortic Atherosclerosis (ICD10-I70.0). Electronically Signed   By: Aram Candela M.D.   On: 10/23/2023 02:40       The results of significant diagnostics from this hospitalization (including imaging, microbiology, ancillary and laboratory) are listed below for reference.     Microbiology: Recent Results (from the past 240 hour(s))  Urine Culture     Status: Abnormal   Collection Time: 10/23/23  3:59 AM   Specimen: Urine, Clean Catch  Result Value Ref Range Status   Specimen Description   Final    URINE, CLEAN CATCH Performed at [facility], [address], [place], [place] [address]    Special Requests   Final    NONE Performed at [facility], [address], [place], [place] [address]    Culture >=100,000 COLONIES/mL ESCHERICHIA COLI (A)  Final   Report Status 10/25/2023 FINAL  Final   Organism ID, Bacteria ESCHERICHIA COLI (A)  Final  Susceptibility   Escherichia coli - MIC*    AMPICILLIN >=32 RESISTANT Resistant     CEFAZOLIN <=4 SENSITIVE Sensitive     CEFEPIME <=0.12 SENSITIVE Sensitive     CEFTRIAXONE <=0.25 SENSITIVE Sensitive     CIPROFLOXACIN 1 RESISTANT Resistant     GENTAMICIN <=1 SENSITIVE Sensitive     IMIPENEM <=0.25 SENSITIVE Sensitive     NITROFURANTOIN 32 SENSITIVE Sensitive     TRIMETH/SULFA <=20 SENSITIVE Sensitive     AMPICILLIN/SULBACTAM 16 INTERMEDIATE Intermediate     PIP/TAZO <=4 SENSITIVE Sensitive ug/mL    * >=100,000 COLONIES/mL ESCHERICHIA COLI  Culture, blood (Routine X 2) w Reflex to ID Panel     Status: None   Collection Time: 10/23/23  6:57 AM   Specimen: BLOOD  Result Value Ref Range Status   Specimen Description   Final    BLOOD SITE NOT SPECIFIED Performed at [facility], [address], [place], [place] [address]    Special Requests   Final    BOTTLES DRAWN AEROBIC AND ANAEROBIC Blood Culture results may not be optimal due to an inadequate  volume of blood received in culture bottles Performed at [facility], [address] [place], [place] [address]    Culture   Final    NO GROWTH 5 DAYS Performed at [facility], [address], [place], [place] [address]    Report Status 10/28/2023 FINAL  Final  Culture, blood (Routine X 2) w Reflex to ID Panel     Status: None   Collection Time: 10/23/23 11:13 AM   Specimen: BLOOD  Result Value Ref Range Status   Specimen Description   Final    BLOOD BLOOD LEFT HAND Performed at [facility], [address] [place], [place] [address]    Special Requests   Final    AEROBIC BOTTLE ONLY Blood Culture adequate volume Performed at [facility], [address] [place], [place] [address]    Culture   Final    NO GROWTH 5 DAYS Performed at [facility], [address], [place], [place] [address]    Report Status 10/28/2023 FINAL  Final  MRSA Next Gen by PCR, Nasal     Status: None   Collection Time: 10/23/23  5:50 PM   Specimen: Nasal Mucosa; Nasal Swab  Result Value Ref Range Status   MRSA by PCR Next Gen NOT DETECTED NOT DETECTED Final    Comment: (NOTE) The GeneXpert MRSA Assay (FDA approved for NASAL specimens only), is one component of a comprehensive MRSA colonization surveillance program. It is not intended to diagnose MRSA infection nor to guide or monitor treatment for MRSA infections. Test performance is not FDA approved in patients less than 36 years old. Performed at [facility], [address] [place], [place] [address]      Labs:  CBC: Recent Labs  Lab 10/23/23 0121 10/24/23 0251 10/25/23 0754 10/26/23 1334 10/27/23 0810 10/28/23 0439  WBC 14.4* 23.5* 11.7* 17.9* 17.0* 15.4*  NEUTROABS 12.6* 20.3*  --   --  13.7*  --   HGB 14.6 11.6* 12.2 13.1 12.8 12.2  HCT 44.3 35.3* 37.8 40.5 39.6 38.1  MCV 89.3 92.2 91.5 91.0 90.2 90.1  PLT 315 147* 140* 178 223 233   BMP  &GFR Recent Labs  Lab 10/23/23 0121 10/24/23 0251 10/25/23 0754 10/26/23 1334 10/27/23 0810  NA 134* 140 133* 138 136  K 3.7 3.4* 3.4*  3.9 3.9  CL 104 112* 105 106 105  CO2 20* 20* 20* 21* 22  GLUCOSE 181* 144* 115* 106* 125*  BUN 32* 26* 15 10 11   CREATININE 0.91 0.94 0.75 0.74 0.72  CALCIUM 9.4 7.7* 7.7* 8.6* 8.3*  MG 2.2 1.7 2.0 2.2 2.1  PHOS  --   --  1.5* 2.2* 2.1*   Estimated Creatinine Clearance: 97.2 mL/min (by C-G formula based on SCr of 0.72 mg/dL). Liver & Pancreas: Recent Labs  Lab 10/23/23 0121 10/24/23 0251 10/25/23 0754 10/26/23 1334 10/27/23 0810  AST 28 32  --   --   --   ALT 34 29  --   --   --   ALKPHOS 86 50  --   --   --   BILITOT 0.5 0.4  --   --   --   PROT 7.7 5.4*  --   --   --   ALBUMIN 4.5 2.9* 2.8* 2.9* 2.5*   Recent Labs  Lab 10/23/23 0121  LIPASE 31   No results for input(s): "AMMONIA" in the last 168 hours. Diabetic: No results for input(s): "HGBA1C" in the last 72 hours. No results for input(s): "GLUCAP" in the last 168 hours. Cardiac Enzymes: No results for input(s): "CKTOTAL", "CKMB", "CKMBINDEX", "TROPONINI" in the last 168 hours. No results for input(s): "PROBNP" in the last 8760 hours. Coagulation Profile: No results for input(s): "INR", "PROTIME" in the last 168 hours. Thyroid Function Tests: No results for input(s): "TSH", "T4TOTAL", "FREET4", "T3FREE", "THYROIDAB" in the last 72 hours. Lipid Profile: No results for input(s): "CHOL", "HDL", "LDLCALC", "TRIG", "CHOLHDL", "LDLDIRECT" in the last 72 hours. Anemia Panel: No results for input(s): "VITAMINB12", "FOLATE", "FERRITIN", "TIBC", "IRON", "RETICCTPCT" in the last 72 hours. Urine analysis:    Component Value Date/Time   COLORURINE YELLOW 10/23/2023 0359   APPEARANCEUR HAZY (A) 10/23/2023 0359   LABSPEC 1.021 10/23/2023 0359   PHURINE 6.0 10/23/2023 0359   GLUCOSEU NEGATIVE 10/23/2023 0359   HGBUR NEGATIVE 10/23/2023 0359   BILIRUBINUR NEGATIVE 10/23/2023 0359    BILIRUBINUR negative 07/09/2022 0851   BILIRUBINUR neg 06/08/2015 1525   KETONESUR 5 (A) 10/23/2023 0359   PROTEINUR NEGATIVE 10/23/2023 0359   UROBILINOGEN 0.2 07/09/2022 0851   UROBILINOGEN 0.2 10/17/2015 2047   NITRITE POSITIVE (A) 10/23/2023 0359   LEUKOCYTESUR SMALL (A) 10/23/2023 0359   Sepsis Labs: Invalid input(s): "PROCALCITONIN", "LACTICIDVEN"   SIGNED:  Almon Hercules, MD  [facility] 10/28/2023, 2:19 PM

## 2023-10-28 NOTE — Plan of Care (Addendum)
  Problem: Education: Goal: Knowledge of General Education information will improve Description: Including pain rating scale, medication(s)/side effects and non-pharmacologic comfort measures Outcome: Progressing   Problem: Health Behavior/Discharge Planning: Goal: Ability to manage health-related needs will improve Outcome: Progressing   Problem: Clinical Measurements: Goal: Will remain free from infection Outcome: Progressing Goal: Diagnostic test results will improve Outcome: Progressing   Problem: Activity: Goal: Risk for activity intolerance will decrease Outcome: Progressing   Problem: Nutrition: Goal: Adequate nutrition will be maintained Outcome: Progressing   Problem: Coping: Goal: Level of anxiety will decrease Outcome: Progressing   Problem: Pain Management: Goal: General experience of comfort will improve Outcome: Progressing   Problem: Safety: Goal: Ability to remain free from injury will improve Outcome: Progressing   Problem: Skin Integrity: Goal: Risk for impaired skin integrity will decrease Outcome: Progressing    AVS was printed,reviewed with, and given to the patient. The patient verbalized understanding its content. The pick up address of the pharmacy was also indicated. The patient discharged home and  the patient's son give her ride home. The patient left the unit at 1039 in a stable condition.

## 2023-11-03 LAB — URINE CULTURE: Culture: >=100000 — AB

## 2023-11-04 ENCOUNTER — Other Ambulatory Visit: Payer: Self-pay | Admitting: Urology

## 2023-11-05 ENCOUNTER — Encounter (HOSPITAL_BASED_OUTPATIENT_CLINIC_OR_DEPARTMENT_OTHER): Payer: Self-pay | Admitting: Urology

## 2023-11-05 NOTE — Progress Notes (Signed)
Spoke w/ via phone for pre-op interview--- Patricia Erickson Lab needs dos----  CBC and BMP per surgeon. UPT per anesthesia       Lab results------Current EKG in EPIC dated 10/23/23. COVID test -----patient states asymptomatic no test needed Arrive at -------1230 NPO after MN NO Solid Food.  Clear liquids from MN until---1130 Med rec completed Medications to take morning of surgery -----Patricia Erickson and Norco PRN Diabetic medication ----- Patient instructed no nail polish to be worn day of surgery Patient instructed to bring photo id and insurance card day of surgery Patient aware to have Driver (ride ) / caregiver    for 24 hours after surgery - Patricia Erickson Patient Special Instructions ----- Pre-Op special Instructions ----- Patient verbalized understanding of instructions that were given at this phone interview. Patient denies chest pain, sob, fever, cough at the interview.

## 2023-11-13 ENCOUNTER — Encounter (HOSPITAL_BASED_OUTPATIENT_CLINIC_OR_DEPARTMENT_OTHER): Payer: Self-pay | Admitting: Urology

## 2023-11-13 ENCOUNTER — Other Ambulatory Visit: Payer: Self-pay

## 2023-11-13 ENCOUNTER — Ambulatory Visit (HOSPITAL_BASED_OUTPATIENT_CLINIC_OR_DEPARTMENT_OTHER)
Admission: RE | Admit: 2023-11-13 | Discharge: 2023-11-13 | Disposition: A | Discharge status: Home / Self Care | Payer: 59 | Attending: Urology | Admitting: Urology

## 2023-11-13 ENCOUNTER — Ambulatory Visit (HOSPITAL_BASED_OUTPATIENT_CLINIC_OR_DEPARTMENT_OTHER): Payer: 59 | Admitting: Certified Registered Nurse Anesthetist

## 2023-11-13 ENCOUNTER — Encounter (HOSPITAL_BASED_OUTPATIENT_CLINIC_OR_DEPARTMENT_OTHER)
Admission: RE | Disposition: A | Discharge status: Home / Self Care | Payer: Self-pay | Source: Home / Self Care | Attending: Urology

## 2023-11-13 DIAGNOSIS — Z87891 Personal history of nicotine dependence: Secondary | ICD-10-CM | POA: Diagnosis not present

## 2023-11-13 DIAGNOSIS — N201 Calculus of ureter: Secondary | ICD-10-CM | POA: Diagnosis present

## 2023-11-13 DIAGNOSIS — Z01818 Encounter for other preprocedural examination: Secondary | ICD-10-CM

## 2023-11-13 HISTORY — PX: CYSTOSCOPY/URETEROSCOPY/HOLMIUM LASER/STENT PLACEMENT: SHX6546

## 2023-11-13 LAB — BASIC METABOLIC PANEL
Anion gap: 11 (ref 5–15)
BUN: 14 mg/dL (ref 6–20)
CO2: 24 mmol/L (ref 22–32)
Calcium: 9 mg/dL (ref 8.9–10.3)
Chloride: 104 mmol/L (ref 98–111)
Creatinine, Ser: 0.75 mg/dL (ref 0.44–1.00)
GFR, Estimated: >60 mL/min (ref >60)
Glucose, Bld: 95 mg/dL (ref 70–99)
Potassium: 3.8 mmol/L (ref 3.5–5.1)
Sodium: 139 mmol/L (ref 135–145)

## 2023-11-13 LAB — POCT I-STAT, CHEM 8
BUN: 13 mg/dL (ref 6–20)
Calcium, Ion: 1.1 mmol/L — ABNORMAL LOW (ref 1.15–1.40)
Chloride: 106 mmol/L (ref 98–111)
Creatinine, Ser: 0.8 mg/dL (ref 0.44–1.00)
Glucose, Bld: 95 mg/dL (ref 70–99)
HCT: 45 % (ref 36.0–46.0)
Hemoglobin: 15.3 g/dL — ABNORMAL HIGH (ref 12.0–15.0)
Potassium: 4 mmol/L (ref 3.5–5.1)
Sodium: 141 mmol/L (ref 135–145)
TCO2: 25 mmol/L (ref 22–32)

## 2023-11-13 LAB — CBC
HCT: 45.6 % (ref 36.0–46.0)
Hemoglobin: 14.3 g/dL (ref 12.0–15.0)
MCH: 28.9 pg (ref 26.0–34.0)
MCHC: 31.4 g/dL (ref 30.0–36.0)
MCV: 92.3 fL (ref 80.0–100.0)
Platelets: 448 10*3/uL — ABNORMAL HIGH (ref 150–400)
RBC: 4.94 MIL/uL (ref 3.87–5.11)
RDW: 14.2 % (ref 11.5–15.5)
WBC: 8.6 10*3/uL (ref 4.0–10.5)
nRBC: 0 % (ref 0.0–0.2)

## 2023-11-13 LAB — POCT PREGNANCY, URINE: Preg Test, Ur: NEGATIVE

## 2023-11-13 SURGERY — CYSTOSCOPY/URETEROSCOPY/HOLMIUM LASER/STENT PLACEMENT
Anesthesia: General | Site: Renal | Laterality: Left

## 2023-11-13 MED ORDER — DEXAMETHASONE SODIUM PHOSPHATE 10 MG/ML IJ SOLN
INTRAMUSCULAR | Status: DC | PRN
  Administered 2023-11-13: 10 mg via INTRAVENOUS

## 2023-11-13 MED ORDER — VANCOMYCIN HCL IN DEXTROSE 1-5 GM/200ML-% IV SOLN
INTRAVENOUS | Status: AC
  Filled 2023-11-13: qty 200

## 2023-11-13 MED ORDER — ACETAMINOPHEN 10 MG/ML IV SOLN
1000.0000 mg | Freq: Once | INTRAVENOUS | Status: DC | PRN
  Administered 2023-11-13: 1000 mg via INTRAVENOUS

## 2023-11-13 MED ORDER — SODIUM CHLORIDE 0.9 % IR SOLN
Status: DC | PRN
  Administered 2023-11-13: 3000 mL

## 2023-11-13 MED ORDER — MIDAZOLAM HCL 2 MG/2ML IJ SOLN
INTRAMUSCULAR | Status: DC | PRN
  Administered 2023-11-13: 2 mg via INTRAVENOUS

## 2023-11-13 MED ORDER — TAMSULOSIN HCL 0.4 MG PO CAPS: 0.4000 mg | ORAL_CAPSULE | Freq: Every day | ORAL | 0 refills | Status: AC

## 2023-11-13 MED ORDER — LIDOCAINE HCL (PF) 2 % IJ SOLN
INTRAMUSCULAR | Status: AC
  Filled 2023-11-13: qty 5

## 2023-11-13 MED ORDER — OXYCODONE HCL 5 MG PO TABS
ORAL_TABLET | ORAL | Status: AC
  Filled 2023-11-13: qty 1

## 2023-11-13 MED ORDER — IOHEXOL 300 MG/ML  SOLN
INTRAMUSCULAR | Status: DC | PRN
  Administered 2023-11-13: 10 mL via URETHRAL

## 2023-11-13 MED ORDER — PROPOFOL 10 MG/ML IV BOLUS
INTRAVENOUS | Status: DC | PRN
  Administered 2023-11-13: 200 mg via INTRAVENOUS

## 2023-11-13 MED ORDER — ONDANSETRON HCL 4 MG/2ML IJ SOLN
INTRAMUSCULAR | Status: AC
  Filled 2023-11-13: qty 2

## 2023-11-13 MED ORDER — PROPOFOL 10 MG/ML IV BOLUS
INTRAVENOUS | Status: AC
  Filled 2023-11-13: qty 20

## 2023-11-13 MED ORDER — FENTANYL CITRATE (PF) 100 MCG/2ML IJ SOLN
INTRAMUSCULAR | Status: AC
  Filled 2023-11-13: qty 2

## 2023-11-13 MED ORDER — PHENYLEPHRINE 80 MCG/ML (10ML) SYRINGE FOR IV PUSH (FOR BLOOD PRESSURE SUPPORT)
PREFILLED_SYRINGE | INTRAVENOUS | Status: AC
  Filled 2023-11-13: qty 10

## 2023-11-13 MED ORDER — HYOSCYAMINE SULFATE 0.125 MG PO TBDP: 0.1250 mg | ORAL_TABLET | Freq: Four times a day (QID) | ORAL | 0 refills | Status: DC | PRN

## 2023-11-13 MED ORDER — ACETAMINOPHEN 10 MG/ML IV SOLN
INTRAVENOUS | Status: AC
  Filled 2023-11-13: qty 100

## 2023-11-13 MED ORDER — KETOROLAC TROMETHAMINE 30 MG/ML IJ SOLN
INTRAMUSCULAR | Status: DC | PRN
  Administered 2023-11-13: 30 mg via INTRAVENOUS

## 2023-11-13 MED ORDER — KETOROLAC TROMETHAMINE 30 MG/ML IJ SOLN
INTRAMUSCULAR | Status: AC
  Filled 2023-11-13: qty 1

## 2023-11-13 MED ORDER — VANCOMYCIN HCL IN DEXTROSE 1-5 GM/200ML-% IV SOLN
1000.0000 mg | INTRAVENOUS | Status: AC
  Administered 2023-11-13: 1000 mg via INTRAVENOUS

## 2023-11-13 MED ORDER — FENTANYL CITRATE (PF) 100 MCG/2ML IJ SOLN
25.0000 ug | INTRAMUSCULAR | Status: DC | PRN
  Administered 2023-11-13: 25 ug via INTRAVENOUS
  Administered 2023-11-13: 50 ug via INTRAVENOUS
  Administered 2023-11-13: 25 ug via INTRAVENOUS

## 2023-11-13 MED ORDER — ONDANSETRON HCL 4 MG/2ML IJ SOLN
INTRAMUSCULAR | Status: DC | PRN
  Administered 2023-11-13: 4 mg via INTRAVENOUS

## 2023-11-13 MED ORDER — SODIUM CHLORIDE 0.9 % IV SOLN: INTRAVENOUS | Status: DC

## 2023-11-13 MED ORDER — DEXAMETHASONE SODIUM PHOSPHATE 10 MG/ML IJ SOLN
INTRAMUSCULAR | Status: AC
  Filled 2023-11-13: qty 1

## 2023-11-13 MED ORDER — OXYCODONE HCL 5 MG/5ML PO SOLN: 5.0000 mg | Freq: Once | ORAL | Status: AC | PRN

## 2023-11-13 MED ORDER — FENTANYL CITRATE (PF) 250 MCG/5ML IJ SOLN
INTRAMUSCULAR | Status: DC | PRN
  Administered 2023-11-13: 25 ug via INTRAVENOUS
  Administered 2023-11-13: 50 ug via INTRAVENOUS
  Administered 2023-11-13: 25 ug via INTRAVENOUS

## 2023-11-13 MED ORDER — DEXMEDETOMIDINE HCL IN NACL 80 MCG/20ML IV SOLN
INTRAVENOUS | Status: DC | PRN
  Administered 2023-11-13: 4 ug via INTRAVENOUS
  Administered 2023-11-13: 8 ug via INTRAVENOUS

## 2023-11-13 MED ORDER — MIDAZOLAM HCL 2 MG/2ML IJ SOLN
INTRAMUSCULAR | Status: AC
  Filled 2023-11-13: qty 2

## 2023-11-13 MED ORDER — OXYCODONE HCL 5 MG PO TABS
5.0000 mg | ORAL_TABLET | Freq: Once | ORAL | Status: AC | PRN
  Administered 2023-11-13: 5 mg via ORAL

## 2023-11-13 MED ORDER — GENTAMICIN SULFATE 40 MG/ML IJ SOLN
360.0000 mg | INTRAVENOUS | Status: AC
  Administered 2023-11-13: 360 mg via INTRAVENOUS
  Filled 2023-11-13: qty 9

## 2023-11-13 MED ORDER — PHENAZOPYRIDINE HCL 200 MG PO TABS: 200.0000 mg | ORAL_TABLET | Freq: Three times a day (TID) | ORAL | 0 refills | Status: DC | PRN

## 2023-11-13 MED ORDER — LIDOCAINE 2% (20 MG/ML) 5 ML SYRINGE
INTRAMUSCULAR | Status: DC | PRN
  Administered 2023-11-13: 100 mg via INTRAVENOUS

## 2023-11-13 MED ORDER — ONDANSETRON HCL 4 MG/2ML IJ SOLN
4.0000 mg | Freq: Once | INTRAMUSCULAR | Status: AC | PRN
  Administered 2023-11-13: 4 mg via INTRAVENOUS

## 2023-11-13 SURGICAL SUPPLY — 21 items
BAG DRAIN URO-CYSTO SKYTR STRL (DRAIN) ×1 IMPLANT
BASKET STONE 1.7 NGAGE (UROLOGICAL SUPPLIES) IMPLANT
BASKET ZERO TIP NITINOL 2.4FR (BASKET) ×1 IMPLANT
CATH URETL OPEN 5X70 (CATHETERS) IMPLANT
CLOTH BEACON ORANGE TIMEOUT ST (SAFETY) ×1 IMPLANT
GLOVE BIO SURGEON STRL SZ8 (GLOVE) IMPLANT
GOWN STRL REUS W/TWL XL LVL3 (GOWN DISPOSABLE) ×1 IMPLANT
GUIDEWIRE STR DUAL SENSOR (WIRE) ×1 IMPLANT
IV NS IRRIG 3000ML ARTHROMATIC (IV SOLUTION) ×2 IMPLANT
KIT TURNOVER CYSTO (KITS) ×1 IMPLANT
LASER FIB FLEXIVA PULSE ID 365 (Laser) IMPLANT
MANIFOLD NEPTUNE II (INSTRUMENTS) ×1 IMPLANT
NS IRRIG 500ML POUR BTL (IV SOLUTION) ×1 IMPLANT
PACK CYSTO (CUSTOM PROCEDURE TRAY) ×1 IMPLANT
SHEATH NAVIGATOR HD 12/14X36 (SHEATH) IMPLANT
SLEEVE SCD COMPRESS KNEE MED (STOCKING) ×1 IMPLANT
STENT URET 6FRX24 CONTOUR (STENTS) IMPLANT
SYR 10ML LL (SYRINGE) IMPLANT
TRACTIP FLEXIVA PULS ID 200XHI (Laser) IMPLANT
TUBE CONNECTING 12X1/4 (SUCTIONS) IMPLANT
TUBING UROLOGY SET (TUBING) ×1 IMPLANT

## 2023-11-13 NOTE — H&P (Signed)
H&P Chief Complaint: Left ureteral stone   History of Present Illness: Patricia Erickson Erickson w/ hx of nephrolithiasis previously stented on 11/14 for Left obstructive pyelonephritis. Her urine Cx grew MDR E. Coli. She is here today for L URS w/ LL.   Past Medical History:  Diagnosis Date   COVID    History of kidney stones    Migraines    hz of migraines    Polyp of colon 03/11/2007   BENIGN   Past Surgical History:  Procedure Laterality Date   CESAREAN SECTION  12/09/1998   CHOLECYSTECTOMY N/A 10/18/2015   Procedure: LAPAROSCOPIC CHOLECYSTECTOMY;  Surgeon: Rodman Pickle, MD;  Location: [facility] OR;  Service: General;  Laterality: N/A;   CYSTOSCOPY WITH STENT PLACEMENT Left 10/23/2023   Procedure: CYSTOSCOPY WITH RETROGRADE PYELOGRAM STENT PLACEMENT;  Surgeon: Adonis Brook, MD;  Location: [facility] ORS;  Service: Urology;  Laterality: Left;   CYSTOSCOPY/URETEROSCOPY/HOLMIUM LASER/STENT PLACEMENT Left 07/31/2020   Procedure: CYSTOSCOPY/URETEROSCOPY/HOLMIUM LASER/STENT PLACEMENT;  Surgeon: Noel Christmas, MD;  Location: [facility] ORS;  Service: Urology;  Laterality: Left;  90 MINS   LAPAROSCOPIC CHOLECYSTECTOMY  10/18/2015    Home Medications:  No medications prior to admission.   Allergies:  Allergies  Allergen Reactions   Propoxyphene N-Acetaminophen Nausea And Vomiting    Family History  Problem Relation Age of Onset   Osteoporosis Mother    Breast cancer Mother 29   Colon cancer Mother 57       removed large intestine   Lymphoma Mother 33   Hypertension Father    Osteoporosis Maternal Grandmother    Lymphoma Maternal Grandmother 63   Colon polyps Sister        "several" dx. 59 or younger   Other Sister        hysterectomy at 34; suspicious breast finding removed at 77   Colon cancer Maternal Aunt 43   Lymphoma Maternal Uncle 63   Lung cancer Maternal Grandfather Patricia Erickson       metastatic to bone; former smoker   Throat cancer Paternal Grandmother 7       not a smoker   Heart Problems  Paternal Grandfather    Other Other        suspicious breast finding at 38 - recent follow-up normal   Heart Problems Maternal Uncle    Bladder Cancer Paternal Uncle Patricia Erickson   Throat cancer Paternal Uncle        dx. before age 75   Social History:  reports that she quit smoking about 31 years ago. Her smoking use included cigarettes. She started smoking about 38 years ago. She has a 3.5 pack-year smoking history. She has never used smokeless tobacco. She reports that she does not currently use alcohol. She reports that she does not use drugs.  ROS: A complete review of systems was performed.  All systems are negative except for pertinent findings as noted. ROS   Physical Exam:  Vital signs in last 24 hours:   General:  Alert and oriented, No acute distress  Laboratory Data:  No results found for this or any previous visit (from the past 24 hour(s)). No results found for this or any previous visit (from the past 240 hour(s)). Creatinine: No results for input(s): "CREATININE" in the last 168 hours.  CT Impression: IMPRESSION: 1. 5 mm obstructing renal calculus within the mid to distal left ureter. 2. 2 mm and 3 mm nonobstructing left renal calculi. 3. Colonic diverticulosis. 4. Evidence of prior cholecystectomy. 5. Aortic atherosclerosis.  Impression/Assessment:  Patricia Erickson Erickson w/ L mid ureteral stone s/p stent for obstrcutive pyelo on 10/23/23 returns today for L URS w/ LL.   We discussed risk benefits alternatives to ureteroscopy.  This included bleeding infection and damage to surrounding structures surrounding structures including ureter as well as urethra.  We discussed the need for stent postoperatively as well as the potential symptoms of stent placement.  We discussed possible inability to complete procedure due to caliber of your ureter or inability to pass stone possibly requiring long-term stent versus nephrostomy tube.  We discussed need for possible second surgery.  Patient  voiced their understanding and consent was obtained.   Plan:  - to OR for L URS w/ LL - abx gent and vanc due to resistances   Adonis Brook 11/13/2023, 7:17 AM

## 2023-11-13 NOTE — Discharge Instructions (Addendum)
DISCHARGE INSTRUCTIONS FOR KIDNEY STONE/URETERAL STENT   MEDICATIONS:  1.  Resume all your other meds from home - except do not take any extra narcotic pain meds that you may have at home.  2. Pyridium is to help with the burning/stinging when you urinate. 3. . Tamsulosin for stent pain  4. Hyoscyamine has been prescribed for bladder spasms   ACTIVITY:  1. No strenuous activity x 1week  2. No driving while on narcotic pain medications  3. Drink plenty of water  4. Continue to walk at home - it is normal to see blood in the urine while the stent is in place, so keep active, but don't over do it.  5. May return to work/school tomorrow or when you feel ready  6. You may experience some pain when urinating in the kidney on the side that was operated on while the stent is in place this is normal  BATHING:  1. You can shower and we recommend daily showers  2. You have a string coming from your urethra: The stent string is attached to your ureteral stent. Do not pull on this.   SIGNS/SYMPTOMS TO CALL:  Please call us if you have a fever greater than 101.5, uncontrolled nausea/vomiting, uncontrolled pain, dizziness, unable to urinate, bloody urine with clots greater than the size of a quarter, chest pain, shortness of breath, leg swelling, leg pain, redness around wound, drainage from wound, or any other concerns or questions.   You can reach [place] at [phone].   FOLLOW-UP:  1. You have an appointment in 6 weeks with a ultrasound of your kidneys prior.   2. You have a string attached to your stent, you may remove it on Monday 11/17/23.  To do this, stand in the shower (you will leak some urine when removing the stent), grabs the strings coming out of the urethra and pull the strings in a consistent motion at medium speed until the stents are completely removed. You may feel an odd sensation in your back while this is occurring.            Post Anesthesia Home Care  Instructions  Activity: Get plenty of rest for the remainder of the day. A responsible individual must stay with you for 24 hours following the procedure.  For the next 24 hours, DO NOT: -Drive a car -Advertising copywriter -Drink alcoholic beverages -Take any medication unless instructed by your physician -Make any legal decisions or sign important papers.  Meals: Start with liquid foods such as gelatin or soup. Progress to regular foods as tolerated. Avoid greasy, spicy, heavy foods. If nausea and/or vomiting occur, drink only clear liquids until the nausea and/or vomiting subsides. Call your physician if vomiting continues.  Special Instructions/Symptoms: Your throat may feel dry or sore from the anesthesia or the breathing tube placed in your throat during surgery. If this causes discomfort, gargle with warm salt water. The discomfort should disappear within 24 hours.  If you had a scopolamine patch placed behind your ear for the management of post- operative nausea and/or vomiting:  1. The medication in the patch is effective for 72 hours, after which it should be removed.  Wrap patch in a tissue and discard in the trash. Wash hands thoroughly with soap and water. 2. You may remove the patch earlier than 72 hours if you experience unpleasant side effects which may include dry mouth, dizziness or visual disturbances. 3. Avoid touching the patch. Wash your hands with soap and water  after contact with the patch.

## 2023-11-13 NOTE — Anesthesia Preprocedure Evaluation (Signed)
Anesthesia Evaluation  Patient identified by MRN, date of birth, ID band Patient awake    Reviewed: Allergy & Precautions, H&P , NPO status , Patient's Chart, lab work & pertinent test results  Airway Mallampati: II  TM Distance: >3 FB Neck ROM: Full    Dental no notable dental hx.    Pulmonary neg pulmonary ROS, former smoker   Pulmonary exam normal breath sounds clear to auscultation       Cardiovascular negative cardio ROS Normal cardiovascular exam Rhythm:Regular Rate:Normal     Neuro/Psych negative neurological ROS  negative psych ROS   GI/Hepatic negative GI ROS, Neg liver ROS,,,  Endo/Other  negative endocrine ROS    Renal/GU negative Renal ROS  negative genitourinary   Musculoskeletal negative musculoskeletal ROS (+)    Abdominal   Peds negative pediatric ROS (+)  Hematology negative hematology ROS (+)   Anesthesia Other Findings   Reproductive/Obstetrics negative OB ROS                             Anesthesia Physical Anesthesia Plan  ASA: 1  Anesthesia Plan: General   Post-op Pain Management: Toradol IV (intra-op)*   Induction: Intravenous  PONV Risk Score and Plan: 3 and Ondansetron, Dexamethasone and Treatment may vary due to age or medical condition  Airway Management Planned: LMA  Additional Equipment:   Intra-op Plan:   Post-operative Plan: Extubation in OR  Informed Consent: I have reviewed the patients History and Physical, chart, labs and discussed the procedure including the risks, benefits and alternatives for the proposed anesthesia with the patient or authorized representative who has indicated his/her understanding and acceptance.     Dental advisory given  Plan Discussed with: CRNA and Surgeon  Anesthesia Plan Comments:        Anesthesia Quick Evaluation

## 2023-11-13 NOTE — Anesthesia Procedure Notes (Signed)
Procedure Name: LMA Insertion Date/Time: 11/13/2023 1:53 PM  Performed by: Dairl Ponder, CRNAPre-anesthesia Checklist: Patient identified, Emergency Drugs available, Suction available and Patient being monitored Patient Re-evaluated:Patient Re-evaluated prior to induction Oxygen Delivery Method: Circle System Utilized Preoxygenation: Pre-oxygenation with 100% oxygen Induction Type: IV induction Ventilation: Mask ventilation without difficulty LMA: LMA inserted LMA Size: 4.0 Number of attempts: 1 Airway Equipment and Method: Bite block Placement Confirmation: positive ETCO2 Tube secured with: Tape Dental Injury: Teeth and Oropharynx as per pre-operative assessment

## 2023-11-13 NOTE — Transfer of Care (Signed)
Immediate Anesthesia Transfer of Care Note  Patient: Patricia Erickson  Procedure(s) Performed: CYSTOSCOPY, LEFT URETEROSCOPY, RETROGRADE PYELOGRAM; HOLMIUM LASER LITHOTRIPSY, AND LEFT URETERAL STENT EXCHANGE (Left: Renal)  Patient Location: PACU  Anesthesia Type:General  Level of Consciousness: drowsy and patient cooperative  Airway & Oxygen Therapy: Patient Spontanous Breathing and Patient connected to nasal cannula oxygen  Post-op Assessment: Report given to RN and Post -op Vital signs reviewed and stable  Post vital signs: Reviewed and stable  Last Vitals:  Vitals Value Taken Time  BP    Temp    Pulse    Resp    SpO2      Last Pain:  Vitals:   11/13/23 1246  TempSrc: Oral         Complications: No notable events documented.

## 2023-11-13 NOTE — Op Note (Signed)
Preoperative diagnosis: left ureteral calculus  Postoperative diagnosis: left ureteral calculus  Procedure:  Cystoscopy left ureteroscopy and stone removal Ureteroscopic laser lithotripsy left 59F x 24 ureteral stent placement  w/ strings  left retrograde pyelography with interpretation   Left retrograde pyelogram with interpretation: No significant filling defects within the renal pelvis, noted 3 main calyces.  Surgeon: Dr. Vilma Prader  Anesthesia: General  Complications: None   EBL: Minimal  Specimens: left ureteral calculus  Disposition of specimens: [facility] for stone analysis  Indication: Patricia Erickson is a 52 y.o.   patient with a left ureteral stone previously stented on 11/14 for obstructive pyelonephritis urine culture grew multidrug-resistant E. coli she was kept in the hospital for several days treated with antibiotics and then discharge she returns today for left ureteroscopy with laser lithotripsy for definitive management of the stone.  After reviewing the management options for treatment, the patient elected to proceed with the above surgical procedure(s). We have discussed the potential benefits and risks of the procedure, side effects of the proposed treatment, the likelihood of the patient achieving the goals of the procedure, and any potential problems that might occur during the procedure or recuperation. Informed consent has been obtained.   Description of procedure:  The patient was taken to the operating room and general anesthesia was induced.  The patient was placed in the dorsal lithotomy position, prepped and draped in the usual sterile fashion, and preoperative antibiotics were administered. A preoperative time-out was performed.   Cystourethroscopy was performed.  The patient's urethra was examined and was normal.  Pan cystoscopy was performed and the bladder systematically examined in its entirety. There was no evidence  for any bladder tumors, stones, or other mucosal pathology.    Attention then turned to the left ureteral orifice, the previous stent was easily visualized a 0.38 sensor wire was then advanced adjacent to the stent.  Per placement in the kidney was confirmed with fluoroscopy.  The stent was then grasped and removed.  Then a semirigid ureteroscope was advanced into the left ureter the stone was identified   The stone was then fragmented with the 242 micron holmium laser fiber on a setting of 0.5 and frequency of 5 Hz.   All stones were then removed from the ureter with an N-gage nitinol basket.  Reinspection of the ureter revealed no remaining visible stones or fragments.   The semirigid was then driven up to the UPJ where a retrograde pyelogram was performed findings as noted above.  A second sensor wire was placed in the collecting system proper placement was confirmed with fluoroscopy then a 12-14 French ureteral access sheath was advanced over one of the sensor wires.  The other 1 placed as a safety wire.  The sheath was advanced to the UPJ under fluoroscopic guidance.  Next the flexible ureteroscope advanced the renal collecting system pyeloscopy was performed 1 stone was identified this was dusted using the 242 m holmium laser until fragments were less than 2.  Millimeters panendoscopy was performed showing no existing large stones.  Scope was then removed as well as the sheath leaving the wire in place and visualizing the ureter there were no injuries to the ureter.  With the existing sensor wire still in place the stent was then placed over the sensor wire and the stent was placed under fluoroscopic guidance proper curl in the kidney and bladder were identified.  The bladder was then emptied and the procedure ended.  The patient  appeared to tolerate the procedure well and without complications.  The patient was able to be awakened and transferred to the recovery unit in satisfactory condition.    Disposition: The tether of the stent was left on and taped to the mons pubis.  Instructions for removing the stent have been provided to the patient. The patient has been scheduled for followup in 6 weeks with a renal ultrasound.

## 2023-11-13 NOTE — Anesthesia Postprocedure Evaluation (Signed)
Anesthesia Post Note  Patient: Patricia Erickson  Procedure(s) Performed: CYSTOSCOPY, LEFT URETEROSCOPY, RETROGRADE PYELOGRAM; HOLMIUM LASER LITHOTRIPSY, AND LEFT URETERAL STENT EXCHANGE (Left: Renal)     Patient location during evaluation: PACU Anesthesia Type: General Level of consciousness: awake and alert Pain management: pain level controlled Vital Signs Assessment: post-procedure vital signs reviewed and stable Respiratory status: spontaneous breathing, nonlabored ventilation, respiratory function stable and patient connected to nasal cannula oxygen Cardiovascular status: blood pressure returned to baseline and stable Postop Assessment: no apparent nausea or vomiting Anesthetic complications: no  No notable events documented.  Last Vitals:  Vitals:   11/13/23 1246 11/13/23 1445  BP: 137/88 123/73  Pulse: 79 68  Resp: 18 18  Temp: 36.6 C (!) 36.4 C  SpO2: 97% 100%    Last Pain:  Vitals:   11/13/23 1445  TempSrc:   PainSc: 0-No pain                 Raymel Cull S

## 2023-11-17 ENCOUNTER — Encounter (HOSPITAL_BASED_OUTPATIENT_CLINIC_OR_DEPARTMENT_OTHER): Payer: Self-pay | Admitting: Urology

## 2023-12-08 ENCOUNTER — Ambulatory Visit (INDEPENDENT_AMBULATORY_CARE_PROVIDER_SITE_OTHER): Payer: 59 | Admitting: Family Medicine

## 2023-12-08 VITALS — BP 126/72 | HR 83 | Temp 97.3°F | Ht 66.0 in | Wt 200.0 lb

## 2023-12-08 DIAGNOSIS — J069 Acute upper respiratory infection, unspecified: Secondary | ICD-10-CM | POA: Diagnosis not present

## 2023-12-08 LAB — POCT RAPID STREP A (OFFICE): Rapid Strep A Screen: NEGATIVE

## 2023-12-08 MED ORDER — BENZONATATE 200 MG PO CAPS: 200.0000 mg | ORAL_CAPSULE | Freq: Three times a day (TID) | ORAL | 0 refills | Status: DC | PRN

## 2023-12-08 NOTE — Progress Notes (Signed)
Acute Office Visit  Subjective:    Patient ID: Patricia Erickson, female    DOB: [birth year], 52 y.o.   MRN: [id number]  Chief Complaint  Patient presents with   Sore Throat   Cough    HPI: Patient is in today for sore throat, coughing low grade fever. Symptoms started Saturday, BL ear pain when swallowing. States she has some PND and mild headache. Has not taken any medications today and gargling with warm salt water. Did take aleve yesterday.  Past Medical History:  Diagnosis Date   COVID    History of kidney stones    Hx of sepsis    Migraines    hz of migraines    Polyp of colon 03/11/2007   BENIGN    Past Surgical History:  Procedure Laterality Date   CESAREAN SECTION  12/09/1998   CHOLECYSTECTOMY N/A 10/18/2015   Procedure: LAPAROSCOPIC CHOLECYSTECTOMY;  Surgeon: Rodman Pickle, MD;  Location: [facility] OR;  Service: General;  Laterality: N/A;   CYSTOSCOPY WITH STENT PLACEMENT Left 10/23/2023   Procedure: CYSTOSCOPY WITH RETROGRADE PYELOGRAM STENT PLACEMENT;  Surgeon: Adonis Brook, MD;  Location: [facility];  Service: Urology;  Laterality: Left;   CYSTOSCOPY/URETEROSCOPY/HOLMIUM LASER/STENT PLACEMENT Left 07/31/2020   Procedure: CYSTOSCOPY/URETEROSCOPY/HOLMIUM LASER/STENT PLACEMENT;  Surgeon: Noel Christmas, MD;  Location: [facility];  Service: Urology;  Laterality: Left;  90 MINS   CYSTOSCOPY/URETEROSCOPY/HOLMIUM LASER/STENT PLACEMENT Left 11/13/2023   Procedure: CYSTOSCOPY, LEFT URETEROSCOPY, RETROGRADE PYELOGRAM; HOLMIUM LASER LITHOTRIPSY, AND LEFT URETERAL STENT EXCHANGE;  Surgeon: Adonis Brook, MD;  Location: [facility];  Service: Urology;  Laterality: Left;  45 MINUTES   LAPAROSCOPIC CHOLECYSTECTOMY  10/18/2015    Family History  Problem Relation Age of Onset   Osteoporosis Mother    Breast cancer Mother 46   Colon cancer Mother 34       removed large intestine   Lymphoma Mother 74   Hypertension Father    Osteoporosis Maternal  Grandmother    Lymphoma Maternal Grandmother 46   Colon polyps Sister        "several" dx. 42 or younger   Other Sister        hysterectomy at 79; suspicious breast finding removed at 3   Colon cancer Maternal Aunt 43   Lymphoma Maternal Uncle 63   Lung cancer Maternal Grandfather 66       metastatic to bone; former smoker   Throat cancer Paternal Grandmother 3       not a smoker   Heart Problems Paternal Grandfather    Other Other        suspicious breast finding at 81 - recent follow-up normal   Heart Problems Maternal Uncle    Bladder Cancer Paternal Uncle 48   Throat cancer Paternal Uncle        dx. before age 24    Social History   Socioeconomic History   Marital status: Divorced    Spouse name: Not on file   Number of children: Not on file   Years of education: 14   Highest education level: Associate degree: occupational, Scientist, product/process development, or vocational program  Occupational History   Not on file  Tobacco Use   Smoking status: Former    Current packs/day: 0.00    Average packs/day: 0.5 packs/day for 7.0 years (3.5 ttl pk-yrs)    Types: Cigarettes    Start date: 10/12/1985    Quit date: 10/12/1992    Years since quitting: 31.1   Smokeless tobacco:  Never  Vaping Use   Vaping status: Never Used  Substance and Sexual Activity   Alcohol use: Not Currently   Drug use: No   Sexual activity: Not Currently    Birth control/protection: I.U.D.    Comment: Mirena 10/29/2012  Other Topics Concern   Not on file  Social History Narrative   Not on file   Social Drivers of Health   Financial Resource Strain: Patient Declined (12/08/2023)   Overall Financial Resource Strain (CARDIA)    Difficulty of Paying Living Expenses: Patient declined  Food Insecurity: No Food Insecurity (12/08/2023)   Hunger Vital Sign    Worried About Running Out of Food in the Last Year: Never true    Ran Out of Food in the Last Year: Never true  Transportation Needs: No Transportation Needs  (12/08/2023)   PRAPARE - Administrator, Civil Service (Medical): No    Lack of Transportation (Non-Medical): No  Physical Activity: Insufficiently Active (12/08/2023)   Exercise Vital Sign    Days of Exercise per Week: 2 days    Minutes of Exercise per Session: 20 min  Stress: No Stress Concern Present (12/08/2023)   [employer] of Occupational Health - Occupational Stress Questionnaire    Feeling of Stress : Not at all  Social Connections: Moderately Integrated (12/08/2023)   Social Connection and Isolation Panel [NHANES]    Frequency of Communication with Friends and Family: More than three times a week    Frequency of Social Gatherings with Friends and Family: Once a week    Attends Religious Services: More than 4 times per year    Active Member of [employer] or Organizations: Yes    Attends Engineer, structural: More than 4 times per year    Marital Status: Divorced  Intimate Partner Violence: Not At Risk (10/24/2023)   Humiliation, Afraid, Rape, and Kick questionnaire    Fear of Current or Ex-Partner: No    Emotionally Abused: No    Physically Abused: No    Sexually Abused: No    Outpatient Medications Prior to Visit  Medication Sig Dispense Refill   gabapentin (NEURONTIN) 300 MG capsule Take 300 mg by mouth daily. Is weaning off.     Bacillus Coagulans-Inulin (PROBIOTIC-PREBIOTIC PO) Take 3.6 g by mouth daily.     Collagen-Vitamin C-Biotin (COLLAGEN PO) Take 1 capsule by mouth daily.     FIBER PO Take 1 Scoop by mouth See admin instructions. Mix 1 scoopful into 4-6 ounces of a desired beverage and drink by mouth once a day     levonorgestrel (MIRENA, 52 MG,) 20 MCG/DAY IUD 1 each by Intrauterine route See admin instructions. Place 1 new device in the uterus every 8 years     Meth-Hyo-M Bl-Na Phos-Ph Sal (URO-MP) 118 MG CAPS Take 1 capsule by mouth 3 (three) times daily as needed (for bladder irritation).     HYDROcodone-acetaminophen (NORCO) 7.5-325 MG  tablet Take 1 tablet by mouth every 6 (six) hours as needed for moderate pain (pain score 4-6).     hyoscyamine (ANASPAZ) 0.125 MG TBDP disintergrating tablet Place 1 tablet (0.125 mg total) under the tongue every 6 (six) hours as needed for up to 20 doses. 20 tablet 0   ibuprofen (ADVIL) 800 MG tablet Take 800 mg by mouth every 8 (eight) hours as needed for moderate pain (pain score 4-6).     Meth-Hyo-M Bl-Benz Acd-Ph Sal (URIBEL PO) Take by mouth.     metoCLOPramide (REGLAN) 5 MG tablet  Take 1 tablet (5 mg total) by mouth every 6 (six) hours as needed for up to 5 days for nausea or vomiting. 20 tablet 0   ondansetron (ZOFRAN-ODT) 4 MG disintegrating tablet Take 1 tablet (4 mg total) by mouth every 8 (eight) hours as needed for nausea or vomiting. (Patient taking differently: Take 4 mg by mouth every 8 (eight) hours as needed for nausea or vomiting (dissolve orally).) 20 tablet 0   phenazopyridine (PYRIDIUM) 200 MG tablet Take 1 tablet (200 mg total) by mouth 3 (three) times daily as needed for up to 6 doses for pain. 6 tablet 0   topiramate (TOPAMAX) 200 MG tablet Take 100 mg by mouth at bedtime.     No facility-administered medications prior to visit.    Allergies  Allergen Reactions   Other Other (See Comments)    Darvocet.   Propoxyphene N-Acetaminophen Nausea And Vomiting    Review of Systems  Constitutional:  Negative for chills, fatigue and fever.  HENT:  Positive for ear pain, postnasal drip and sore throat. Negative for congestion, rhinorrhea, sinus pressure and sinus pain.   Respiratory:  Positive for cough. Negative for shortness of breath.   Cardiovascular:  Negative for chest pain.  Gastrointestinal:  Negative for diarrhea and nausea.  Neurological:  Positive for headaches. Negative for dizziness.       Objective:        12/08/2023   10:31 AM 11/13/2023    4:20 PM 11/13/2023    4:15 PM  Vitals with BMI  Height 5\' 6"     Weight 200 lbs    BMI 32.3    Systolic 126  127 124  Diastolic 72 76 75  Pulse 83 57 58    No data found.   Physical Exam Vitals reviewed.  Constitutional:      Appearance: Normal appearance. She is normal weight.  HENT:     Right Ear: Tympanic membrane, ear canal and external ear normal.     Left Ear: Tympanic membrane and external ear normal.     Nose: Nose normal. No congestion.     Right Turbinates: Swollen.     Left Turbinates: Swollen.     Right Sinus: No maxillary sinus tenderness or frontal sinus tenderness.     Left Sinus: No maxillary sinus tenderness or frontal sinus tenderness.     Mouth/Throat:     Pharynx: Oropharynx is clear. Posterior oropharyngeal erythema present.  Cardiovascular:     Rate and Rhythm: Normal rate and regular rhythm.     Pulses: Normal pulses.     Heart sounds: Normal heart sounds. No murmur heard. Pulmonary:     Effort: Pulmonary effort is normal. No respiratory distress.     Breath sounds: Normal breath sounds.  Abdominal:     General: Abdomen is flat. Bowel sounds are normal.     Palpations: Abdomen is soft.     Tenderness: There is no abdominal tenderness.  Neurological:     Mental Status: She is alert and oriented to person, place, and time.  Psychiatric:        Mood and Affect: Mood normal.        Behavior: Behavior normal.     Health Maintenance Due  Topic Date Due   Zoster Vaccines- Shingrix (1 of 2) Never done    There are no preventive care reminders to display for this patient.   Lab Results  Component Value Date   TSH 0.923 05/15/2022   Lab Results  Component  Value Date   WBC 8.6 11/13/2023   HGB 15.3 (H) 11/13/2023   HCT 45.0 11/13/2023   MCV 92.3 11/13/2023   PLT 448 (H) 11/13/2023   Lab Results  Component Value Date   NA 141 11/13/2023   K 4.0 11/13/2023   CO2 24 11/13/2023   GLUCOSE 95 11/13/2023   BUN 13 11/13/2023   CREATININE 0.80 11/13/2023   BILITOT 0.4 10/24/2023   ALKPHOS 50 10/24/2023   AST 32 10/24/2023   ALT 29 10/24/2023   PROT  5.4 (L) 10/24/2023   ALBUMIN 2.5 (L) 10/27/2023   CALCIUM 9.0 11/13/2023   ANIONGAP 11 11/13/2023   EGFR 80 05/15/2022   Lab Results  Component Value Date   CHOL 182 05/15/2022   Lab Results  Component Value Date   HDL 47 05/15/2022   Lab Results  Component Value Date   LDLCALC 121 (H) 05/15/2022   Lab Results  Component Value Date   TRIG 74 05/15/2022   Lab Results  Component Value Date   CHOLHDL 3.9 05/15/2022   Lab Results  Component Value Date   HGBA1C 5.7 (H) 10/24/2023       Assessment & Plan:  Viral URI with cough Assessment & Plan: Recommend mucinex, tessalon perles.  Recommend push fluids.  Recommend aleve for discomfort.  Orders: -     POCT rapid strep A -     Benzonatate; Take 1 capsule (200 mg total) by mouth 3 (three) times daily as needed for cough.  Dispense: 30 capsule; Refill: 0     Meds ordered this encounter  Medications   benzonatate (TESSALON) 200 MG capsule    Sig: Take 1 capsule (200 mg total) by mouth 3 (three) times daily as needed for cough.    Dispense:  30 capsule    Refill:  0    Orders Placed This Encounter  Procedures   Rapid Strep A     Follow-up: No follow-ups on file.  An After Visit Summary was printed and given to the patient.  Blane Ohara, MD [facility] [phone]

## 2023-12-08 NOTE — Assessment & Plan Note (Signed)
Recommend mucinex, tessalon perles.  Recommend push fluids.  Recommend aleve for discomfort.

## 2023-12-16 ENCOUNTER — Telehealth: Payer: Self-pay | Admitting: Physician Assistant

## 2023-12-16 NOTE — Telephone Encounter (Signed)
 Copied from CRM [id number]. Topic: Referral - Question >> Dec 15, 2023  4:24 PM Gildardo Pounds wrote: Reason for CRM: Patient has new insurance and needs to know if she needs a referral for [facility] in [place]. Callback number is [phone].

## 2023-12-16 NOTE — Telephone Encounter (Signed)
 She will need to schedule an appt with our office to get referral because of MCD and need for documentation of reason/issues for referral

## 2023-12-16 NOTE — Telephone Encounter (Signed)
 Patient states that she has been going to [facility] for years and because she now has medicaid she needs [place] to send them a referral

## 2023-12-23 ENCOUNTER — Encounter: Payer: Self-pay | Admitting: Physician Assistant

## 2023-12-23 ENCOUNTER — Ambulatory Visit (INDEPENDENT_AMBULATORY_CARE_PROVIDER_SITE_OTHER): Payer: Medicaid Other | Admitting: Physician Assistant

## 2023-12-23 VITALS — BP 120/70 | HR 90 | Temp 97.2°F | Resp 18 | Ht 66.0 in | Wt 206.0 lb

## 2023-12-23 DIAGNOSIS — G8929 Other chronic pain: Secondary | ICD-10-CM | POA: Insufficient documentation

## 2023-12-23 DIAGNOSIS — M722 Plantar fascial fibromatosis: Secondary | ICD-10-CM

## 2023-12-23 DIAGNOSIS — R109 Unspecified abdominal pain: Secondary | ICD-10-CM | POA: Diagnosis not present

## 2023-12-23 DIAGNOSIS — M79604 Pain in right leg: Secondary | ICD-10-CM

## 2023-12-23 DIAGNOSIS — R102 Pelvic and perineal pain unspecified side: Secondary | ICD-10-CM | POA: Insufficient documentation

## 2023-12-23 LAB — POCT URINALYSIS DIP (CLINITEK)
Bilirubin, UA: NEGATIVE
Blood, UA: NEGATIVE
Glucose, UA: NEGATIVE mg/dL
Ketones, POC UA: NEGATIVE mg/dL
Leukocytes, UA: NEGATIVE
Nitrite, UA: NEGATIVE
POC PROTEIN,UA: NEGATIVE
Spec Grav, UA: 1.01 (ref 1.010–1.025)
Urobilinogen, UA: 0.2 U/dL
pH, UA: 5.5 (ref 5.0–8.0)

## 2023-12-23 NOTE — Progress Notes (Signed)
 Subjective:  Patient ID: Patricia Erickson, female    DOB: Apr 09, 1971  Age: 53 y.o. MRN: [id number]  Chief Complaint  Patient presents with   Leg Pain   Abdominal Pain    lower    HPI Pt in today with multiple concerns - I have not seen in office in over 1 and1/2 years  Pt with chronic right leg pain and numbness.  She has been a patient at [facility] for several years and with her new insurance needs a referral.  She has appt this week with Jaclyn Bissell  Pt also needs referral to her podiatrist for follow up of left plantar fasciitis.  She sees Dr Wendi Hai at [facility]  Pt was admitted to hospital in November for kidney stones and sepsis.  She did have stent placed and was treated with antibiotics.  She continues to follow with [facility]  Pt states that she has had lower abdominal and pelvic pain intermittently since being released from hospital in November.  She occasionally has nausea but no vomiting.  Denies change in bowels.  Denies melena or hematochezia.   She denies vaginal pain or discharge - does have IUD She describes as general achiness and pressure in lower abdomen Denies urine symptoms today     06/04/2023    1:31 PM 05/15/2022    8:16 AM 04/02/2022    9:48 AM 01/02/2017   10:25 AM 07/05/2015    4:50 PM  Depression screen PHQ 2/9  Decreased Interest 0 0 0 0 0  Down, Depressed, Hopeless 0 0 0 0 0  PHQ - 2 Score 0 0 0 0 0        06/29/2021   10:05 AM 04/02/2022    9:48 AM 05/13/2022    2:57 PM 05/15/2022    8:15 AM 11/18/2022    8:39 AM  Fall Risk  Falls in the past year?  0  0   Was there an injury with Fall?  0  0   Fall Risk Category Calculator  0  0   Fall Risk Category (Retired)  Low  Low   (RETIRED) Patient Fall Risk Level Low fall risk Low fall risk Low fall risk Low fall risk Low fall risk  Patient at Risk for Falls Due to    No Fall Risks   Fall risk Follow up    Falls evaluation completed      ROS CONSTITUTIONAL: Negative for  chills, fatigue, fever, unintentional weight gain and unintentional weight loss.  E/N/T: Negative for ear pain, nasal congestion and sore throat.  CARDIOVASCULAR: Negative for chest pain, dizziness, palpitations and pedal edema.  RESPIRATORY: Negative for recent cough and dyspnea.  GASTROINTESTINAL: see HPI MSK: see HPI INTEGUMENTARY: Negative for rash.  NEUROLOGICAL: Negative for dizziness and headaches.  PSYCHIATRIC: Negative for sleep disturbance and to question depression screen.  Negative for depression, negative for anhedonia.    Current Outpatient Medications:    Bacillus Coagulans-Inulin (PROBIOTIC-PREBIOTIC PO), Take 3.6 g by mouth daily., Disp: , Rfl:    Collagen-Vitamin C-Biotin (COLLAGEN PO), Take 1 capsule by mouth daily., Disp: , Rfl:    FIBER PO, Take 1 Scoop by mouth See admin instructions. Mix 1 scoopful into 4-6 ounces of a desired beverage and drink by mouth once a day, Disp: , Rfl:    levonorgestrel  (MIRENA , 52 MG,) 20 MCG/DAY IUD, 1 each by Intrauterine route See admin instructions. Place 1 new device in the uterus every 8 years, Disp: ,  Rfl:    Meth-Hyo-M Bl-Na Phos-Ph Sal (URO-MP) 118 MG CAPS, Take 1 capsule by mouth 3 (three) times daily as needed (for bladder irritation)., Disp: , Rfl:    methenamine (HIPREX) 1 g tablet, Take 1 g by mouth daily., Disp: , Rfl:    tamsulosin  (FLOMAX ) 0.4 MG CAPS capsule, TAKE 1 CAPSULE (0.4 MG TOTAL) BY MOUTH DAILY AFTER SUPPER FOR 20 DOSES., Disp: , Rfl:   Past Medical History:  Diagnosis Date   COVID    History of kidney stones    Hx of sepsis    Migraines    hz of migraines    Polyp of colon 03/11/2007   BENIGN   Objective:  PHYSICAL EXAM:   BP 120/70 (BP Location: Left Arm, Patient Position: Sitting, Cuff Size: Large)   Pulse 90   Temp (!) 97.2 F (36.2 C) (Temporal)   Resp 18   Ht 5' 6 (1.676 m)   Wt 206 lb (93.4 kg)   SpO2 97%   BMI 33.25 kg/m    GEN: Well nourished, well developed, in no acute distress   Cardiac: RRR; no murmurs, rubs, or gallops,no edema -  Respiratory:  normal respiratory rate and pattern with no distress - normal breath sounds with no rales, rhonchi, wheezes or rubs GI: normal bowel sounds, no masses - moderate tenderness to lower abdomen MS: no deformity or atrophy  Skin: warm and dry, no rash  Neuro:  Alert and Oriented x 3, Strength and sensation are intact - CN II-Xii grossly intact Psych: euthymic mood, app Office Visit on 12/23/2023  Component Date Value Ref Range Status   Color, UA 12/23/2023 yellow  yellow Final   Clarity, UA 12/23/2023 cloudy (A)  clear Final   Glucose, UA 12/23/2023 negative  negative mg/dL Final   Bilirubin, UA 98/85/7974 negative  negative Final   Ketones, POC UA 12/23/2023 negative  negative mg/dL Final   Spec Grav, UA 98/85/7974 1.010  1.010 - 1.025 Final   Blood, UA 12/23/2023 negative  negative Final   pH, UA 12/23/2023 5.5  5.0 - 8.0 Final   POC PROTEIN,UA 12/23/2023 negative  negative, trace Final   Urobilinogen, UA 12/23/2023 0.2  0.2 or 1.0 E.U./dL Final   Nitrite, UA 98/85/7974 Negative  Negative Final   Leukocytes, UA 12/23/2023 Negative  Negative Final    Assessment & Plan:    Chronic abdominal pain -     POCT URINALYSIS DIP (CLINITEK) -     CBC with Differential/Platelet -     Comprehensive metabolic panel  Chronic pelvic pain in female -     POCT URINALYSIS DIP (CLINITEK) -     CBC with Differential/Platelet -     Comprehensive metabolic panel -     CT ABDOMEN PELVIS W CONTRAST; Future  Plantar fasciitis of left foot -     Ambulatory referral to Podiatry  Right leg pain -     Ambulatory referral to Orthopedic Surgery     Follow-up: Return in about 6 weeks (around 02/03/2024) for fasting physical - 20 min.  An After Visit Summary was printed and given to the patient.  CAMIE JONELLE NICHOLAUS DEVON[facility] [phone]

## 2023-12-24 LAB — CBC WITH DIFFERENTIAL/PLATELET
Basophils Absolute: 0 10*3/uL (ref 0.0–0.2)
Basos: 0 %
EOS (ABSOLUTE): 0.2 10*3/uL (ref 0.0–0.4)
Eos: 2 %
Hematocrit: 41.1 % (ref 34.0–46.6)
Hemoglobin: 13.5 g/dL (ref 11.1–15.9)
Immature Grans (Abs): 0 10*3/uL (ref 0.0–0.1)
Immature Granulocytes: 0 %
Lymphocytes Absolute: 2.5 10*3/uL (ref 0.7–3.1)
Lymphs: 31 %
MCH: 29.2 pg (ref 26.6–33.0)
MCHC: 32.8 g/dL (ref 31.5–35.7)
MCV: 89 fL (ref 79–97)
Monocytes Absolute: 0.6 10*3/uL (ref 0.1–0.9)
Monocytes: 7 %
Neutrophils Absolute: 4.8 10*3/uL (ref 1.4–7.0)
Neutrophils: 60 %
Platelets: 342 10*3/uL (ref 150–450)
RBC: 4.63 x10E6/uL (ref 3.77–5.28)
RDW: 14.1 % (ref 11.7–15.4)
WBC: 8.1 10*3/uL (ref 3.4–10.8)

## 2023-12-24 LAB — COMPREHENSIVE METABOLIC PANEL
ALT: 31 [IU]/L (ref 0–32)
AST: 29 [IU]/L (ref 0–40)
Albumin: 4.6 g/dL (ref 3.8–4.9)
Alkaline Phosphatase: 91 [IU]/L (ref 44–121)
BUN/Creatinine Ratio: 18 (ref 9–23)
BUN: 16 mg/dL (ref 6–24)
Bilirubin Total: 0.4 mg/dL (ref 0.0–1.2)
CO2: 25 mmol/L (ref 20–29)
Calcium: 9.6 mg/dL (ref 8.7–10.2)
Chloride: 101 mmol/L (ref 96–106)
Creatinine, Ser: 0.88 mg/dL (ref 0.57–1.00)
Globulin, Total: 2.1 g/dL (ref 1.5–4.5)
Glucose: 105 mg/dL — ABNORMAL HIGH (ref 70–99)
Potassium: 4.7 mmol/L (ref 3.5–5.2)
Sodium: 141 mmol/L (ref 134–144)
Total Protein: 6.7 g/dL (ref 6.0–8.5)
eGFR: 78 mL/min/{1.73_m2} (ref >59)

## 2023-12-30 ENCOUNTER — Ambulatory Visit: Payer: Self-pay | Admitting: Physician Assistant

## 2023-12-30 ENCOUNTER — Ambulatory Visit: Payer: Medicaid Other | Admitting: Physician Assistant

## 2023-12-30 ENCOUNTER — Encounter: Payer: Self-pay | Admitting: Physician Assistant

## 2023-12-30 VITALS — BP 130/72 | HR 73 | Temp 97.8°F | Resp 16 | Ht 66.0 in | Wt 198.0 lb

## 2023-12-30 DIAGNOSIS — R109 Unspecified abdominal pain: Secondary | ICD-10-CM

## 2023-12-30 DIAGNOSIS — R600 Localized edema: Secondary | ICD-10-CM | POA: Insufficient documentation

## 2023-12-30 DIAGNOSIS — G8929 Other chronic pain: Secondary | ICD-10-CM

## 2023-12-30 DIAGNOSIS — N3000 Acute cystitis without hematuria: Secondary | ICD-10-CM | POA: Diagnosis not present

## 2023-12-30 DIAGNOSIS — K5792 Diverticulitis of intestine, part unspecified, without perforation or abscess without bleeding: Secondary | ICD-10-CM

## 2023-12-30 MED ORDER — FUROSEMIDE 20 MG PO TABS: 20.0000 mg | ORAL_TABLET | Freq: Every day | ORAL | 0 refills | Status: DC

## 2023-12-30 NOTE — Assessment & Plan Note (Signed)
Ongoing since hospitalization for sepsis in November. No clear etiology identified yet. -Await results of ordered CT abdomen for further evaluation. -Has not had CT scheduled, she was given number to call to see if they can schedule it.

## 2023-12-30 NOTE — Progress Notes (Signed)
Acute Office Visit  Subjective:    Patient ID: Patricia Erickson, female    DOB: 03-19-1971, 53 y.o.   MRN: [id number]  Chief Complaint  Patient presents with   Joint Swelling    Discussed the use of AI scribe software for clinical note transcription with the patient, who gave verbal consent to proceed.  HPI: Patient is in today for ankle swelling. She stated that they have been swelling since yesterday. Discussed the use of AI scribe software for clinical note transcription with the patient, who gave verbal consent to proceed.  History of Present Illness   The patient, a 53 year old with a history of plantar fasciitis, presents with bilateral feet swelling. The swelling was noticed the previous night and has not responded to elevation or icing. The patient denies chest pain, shortness of breath, and leg heaviness. She has a history of receiving injections for foot pain, the most recent one being in August. The patient denies any similar swelling following previous injections.  In addition to the swelling, the patient reports a numb sensation in the right leg, from the knee up to her hip. She denies any injury to the leg and has an upcoming MRI scheduled to investigate the cause of the numbness. The patient also reports a history of kidney stones and a severe infection that required hospitalization and ICU care in November. Since then, she has experienced intermittent nausea and has not felt well overall. A CT scan of the abdomen has been ordered to investigate this further.  The patient also mentions a recent urinary tract infection (UTI) and a history of diverticulitis. She has been prescribed gabapentin for the numbness in her leg, which she started taking last Friday. The patient denies any new medications apart from gabapentin.       Past Medical History:  Diagnosis Date   COVID    History of kidney stones    Hx of sepsis    Migraines    hz of migraines    Polyp of colon  03/11/2007   BENIGN    Past Surgical History:  Procedure Laterality Date   CESAREAN SECTION  12/09/1998   CHOLECYSTECTOMY N/A 10/18/2015   Procedure: LAPAROSCOPIC CHOLECYSTECTOMY;  Surgeon: Rodman Pickle, MD;  Location: [facility] OR;  Service: General;  Laterality: N/A;   CYSTOSCOPY WITH STENT PLACEMENT Left 10/23/2023   Procedure: CYSTOSCOPY WITH RETROGRADE PYELOGRAM STENT PLACEMENT;  Surgeon: Adonis Brook, MD;  Location: [facility];  Service: Urology;  Laterality: Left;   CYSTOSCOPY/URETEROSCOPY/HOLMIUM LASER/STENT PLACEMENT Left 07/31/2020   Procedure: CYSTOSCOPY/URETEROSCOPY/HOLMIUM LASER/STENT PLACEMENT;  Surgeon: Noel Christmas, MD;  Location: [facility];  Service: Urology;  Laterality: Left;  90 MINS   CYSTOSCOPY/URETEROSCOPY/HOLMIUM LASER/STENT PLACEMENT Left 11/13/2023   Procedure: CYSTOSCOPY, LEFT URETEROSCOPY, RETROGRADE PYELOGRAM; HOLMIUM LASER LITHOTRIPSY, AND LEFT URETERAL STENT EXCHANGE;  Surgeon: Adonis Brook, MD;  [facility];  Service: Urology;  Laterality: Left;  45 MINUTES   LAPAROSCOPIC CHOLECYSTECTOMY  10/18/2015    Family History  Problem Relation Age of Onset   Osteoporosis Mother    Breast cancer Mother 62   Colon cancer Mother 75       removed large intestine   Lymphoma Mother 47   Hypertension Father    Osteoporosis Maternal Grandmother    Lymphoma Maternal Grandmother 94   Colon polyps Sister        "several" dx. 43 or younger   Other Sister        hysterectomy at 39;  suspicious breast finding removed at 50   Colon cancer Maternal Aunt 43   Lymphoma Maternal Uncle 63   Lung cancer Maternal Grandfather 66       metastatic to bone; former smoker   Throat cancer Paternal Grandmother 35       not a smoker   Heart Problems Paternal Grandfather    Other Other        suspicious breast finding at 87 - recent follow-up normal   Heart Problems Maternal Uncle    Bladder Cancer Paternal Uncle 47   Throat cancer Paternal Uncle         dx. before age 21    Social History   Socioeconomic History   Marital status: Divorced    Spouse name: Not on file   Number of children: Not on file   Years of education: 14   Highest education level: Associate degree: occupational, Scientist, product/process development, or vocational program  Occupational History   Not on file  Tobacco Use   Smoking status: Former    Current packs/day: 0.00    Average packs/day: 0.5 packs/day for 7.0 years (3.5 ttl pk-yrs)    Types: Cigarettes    Start date: 10/12/1985    Quit date: 10/12/1992    Years since quitting: 31.2   Smokeless tobacco: Never  Vaping Use   Vaping status: Never Used  Substance and Sexual Activity   Alcohol use: Not Currently   Drug use: No   Sexual activity: Not Currently    Birth control/protection: I.U.D.    Comment: Mirena 10/29/2012  Other Topics Concern   Not on file  Social History Narrative   Not on file   Social Drivers of Health   Financial Resource Strain: Patient Declined (12/08/2023)   Overall Financial Resource Strain (CARDIA)    Difficulty of Paying Living Expenses: Patient declined  Food Insecurity: No Food Insecurity (12/08/2023)   Hunger Vital Sign    Worried About Running Out of Food in the Last Year: Never true    Ran Out of Food in the Last Year: Never true  Transportation Needs: No Transportation Needs (12/08/2023)   PRAPARE - Administrator, Civil Service (Medical): No    Lack of Transportation (Non-Medical): No  Physical Activity: Insufficiently Active (12/08/2023)   Exercise Vital Sign    Days of Exercise per Week: 2 days    Minutes of Exercise per Session: 20 min  Stress: No Stress Concern Present (12/08/2023)   [employer] of Occupational Health - Occupational Stress Questionnaire    Feeling of Stress : Not at all  Social Connections: Moderately Integrated (12/08/2023)   Social Connection and Isolation Panel [NHANES]    Frequency of Communication with Friends and Family: More than  three times a week    Frequency of Social Gatherings with Friends and Family: Once a week    Attends Religious Services: More than 4 times per year    Active Member of [employer] or Organizations: Yes    Attends Engineer, structural: More than 4 times per year    Marital Status: Divorced  Intimate Partner Violence: Not At Risk (10/24/2023)   Humiliation, Afraid, Rape, and Kick questionnaire    Fear of Current or Ex-Partner: No    Emotionally Abused: No    Physically Abused: No    Sexually Abused: No    Outpatient Medications Prior to Visit  Medication Sig Dispense Refill   Bacillus Coagulans-Inulin (PROBIOTIC-PREBIOTIC PO) Take 3.6 g by mouth daily.  Collagen-Vitamin C-Biotin (COLLAGEN PO) Take 1 capsule by mouth daily.     FIBER PO Take 1 Scoop by mouth See admin instructions. Mix 1 scoopful into 4-6 ounces of a desired beverage and drink by mouth once a day     gabapentin (NEURONTIN) 300 MG capsule Take 300 mg by mouth 2 (two) times daily.     levonorgestrel (MIRENA, 52 MG,) 20 MCG/DAY IUD 1 each by Intrauterine route See admin instructions. Place 1 new device in the uterus every 8 years     Meth-Hyo-M Bl-Na Phos-Ph Sal (URO-MP) 118 MG CAPS Take 1 capsule by mouth 3 (three) times daily as needed (for bladder irritation).     methenamine (HIPREX) 1 g tablet Take 1 g by mouth daily.     Ondansetron (ZUPLENZ PO) Take 8 mg by mouth daily.     tamsulosin (FLOMAX) 0.4 MG CAPS capsule TAKE 1 CAPSULE (0.4 MG TOTAL) BY MOUTH DAILY AFTER SUPPER FOR 20 DOSES.     methenamine (HIPREX) 1 g tablet Take 1 g by mouth 2 (two) times daily with a meal.     No facility-administered medications prior to visit.    Allergies  Allergen Reactions   Other Other (See Comments)    Darvocet.   Propoxyphene N-Acetaminophen Nausea And Vomiting    Review of Systems  Constitutional:  Negative for chills, fatigue and fever.  HENT:  Negative for congestion, ear pain and sore throat.   Respiratory:   Negative for cough and shortness of breath.   Cardiovascular:  Positive for leg swelling. Negative for chest pain and palpitations.  Gastrointestinal:  Negative for abdominal pain, constipation, diarrhea, nausea and vomiting.  Genitourinary:  Negative for difficulty urinating and dysuria.  Musculoskeletal:  Negative for arthralgias, back pain and myalgias.  Skin:  Negative for rash.  Neurological:  Negative for dizziness and headaches.  Psychiatric/Behavioral:  Negative for dysphoric mood.        Objective:        12/30/2023    3:57 PM 12/23/2023    2:18 PM 12/08/2023   10:31 AM  Vitals with BMI  Height 5\' 6"  5\' 6"  5\' 6"   Weight 198 lbs 206 lbs 200 lbs  BMI 31.97 33.27 32.3  Systolic 130 120 604  Diastolic 72 70 72  Pulse 73 90 83    No data found.    Physical Exam Vitals reviewed.  Constitutional:      Appearance: Normal appearance.  Cardiovascular:     Rate and Rhythm: Normal rate and regular rhythm.     Heart sounds: Normal heart sounds.  Pulmonary:     Effort: Pulmonary effort is normal.     Breath sounds: Normal breath sounds.  Abdominal:     General: Bowel sounds are normal.     Palpations: Abdomen is soft.     Tenderness: There is no abdominal tenderness.  Musculoskeletal:     Right lower leg: No tenderness. 1+ Edema present.     Left lower leg: Tenderness present. 1+ Edema present.     Right ankle: Swelling present. No tenderness. Normal range of motion.     Left ankle: Swelling present. No tenderness. Normal range of motion.     Comments: Minor pain squeezing left calf. Patient  Neurological:     Mental Status: She is alert and oriented to person, place, and time.  Psychiatric:        Mood and Affect: Mood normal.        Behavior: Behavior normal.  Health Maintenance Due  Topic Date Due   Zoster Vaccines- Shingrix (1 of 2) Never done    There are no preventive care reminders to display for this patient.   Lab Results  Component Value Date    TSH 0.923 05/15/2022   Lab Results  Component Value Date   WBC 8.1 12/23/2023   HGB 13.5 12/23/2023   HCT 41.1 12/23/2023   MCV 89 12/23/2023   PLT 342 12/23/2023   Lab Results  Component Value Date   NA 141 12/23/2023   K 4.7 12/23/2023   CO2 25 12/23/2023   GLUCOSE 105 (H) 12/23/2023   BUN 16 12/23/2023   CREATININE 0.88 12/23/2023   BILITOT 0.4 12/23/2023   ALKPHOS 91 12/23/2023   AST 29 12/23/2023   ALT 31 12/23/2023   PROT 6.7 12/23/2023   ALBUMIN 4.6 12/23/2023   CALCIUM 9.6 12/23/2023   ANIONGAP 11 11/13/2023   EGFR 78 12/23/2023   Lab Results  Component Value Date   CHOL 182 05/15/2022   Lab Results  Component Value Date   HDL 47 05/15/2022   Lab Results  Component Value Date   LDLCALC 121 (H) 05/15/2022   Lab Results  Component Value Date   TRIG 74 05/15/2022   Lab Results  Component Value Date   CHOLHDL 3.9 05/15/2022   Lab Results  Component Value Date   HGBA1C 5.7 (H) 10/24/2023       Assessment & Plan:   Lower extremity edema Assessment & Plan: Bilateral Lower Extremity Edema New onset following recent gabapentin initiation. No signs of deep vein thrombosis or heart failure. -Start Lasix once daily for two weeks to reduce fluid retention. -Monitor for return of swelling after discontinuation of Lasix, which may necessitate change in gabapentin therapy.  Orders: -     Furosemide; Take 1 tablet (20 mg total) by mouth daily.  Dispense: 14 tablet; Refill: 0  Chronic abdominal pain Assessment & Plan: Ongoing since hospitalization for sepsis in November. No clear etiology identified yet. -Await results of ordered CT abdomen for further evaluation. -Has not had CT scheduled, she was given number to call to see if they can schedule it.   Acute cystitis without hematuria Assessment & Plan: Recently completed treatment for UTI. No current symptoms. -Monitor for recurrence of symptoms.   Diverticulitis Assessment & Plan: History of  diverticulitis with severe pain episodes. Currently asymptomatic. -Continue dietary modifications to avoid flare-ups.      Meds ordered this encounter  Medications   furosemide (LASIX) 20 MG tablet    Sig: Take 1 tablet (20 mg total) by mouth daily.    Dispense:  14 tablet    Refill:  0    No orders of the defined types were placed in this encounter.   Follow-up Monitor for worsening symptoms including redness, calf pain, shortness of breath, and return of swelling after Lasix discontinuation.            Follow-up: No follow-ups on file.  An After Visit Summary was printed and given to the patient.  Langley Gauss, [place] [facility] [phone]

## 2023-12-30 NOTE — Telephone Encounter (Signed)
Copied from CRM [id number]. Topic: Clinical - Red Word Triage >> Dec 30, 2023  2:49 PM Antony Haste wrote: Red Word that prompted transfer to Nurse Triage: This patient noticed last night that both of her ankles were extremely swollen after receiving a cortisone shot in her left foot. Her blood pressure levels are 144/76 and pulse is 59  Chief Complaint: swelling Symptoms: bilateral ankle swelling & pt concerned of high BP reading 144/76 Frequency: ankle swelling started yesterday and BP reading today Pertinent Negatives: Patient denies SOB  Disposition: [] ED /[] Urgent Care (no appt availability in office) / [x] Appointment(In office/virtual)/ []  [place] [facility]/ [] Home Care/ [] Refused Recommended Disposition /[] [place] [facility]/ []  Follow-up with PCP Additional Notes: pt stated she has other issues from prior visit that noone has reached out to follow up on with her  Answer Assessment - Initial Assessment Questions 1. LOCATION: "Which ankle is swollen?" "Where is the swelling?"     Bilateral ankles swelling last night - severe and  high BP- today 2. ONSET: "When did the swelling start? "     After 5pm yesterdat 3. SWELLING: "How bad is the swelling?" Or, "How large is it?" (e.g., mild, moderate, severe; size of localized swelling)    - NONE: No joint swelling.   - LOCALIZED: Localized; small area of puffy or swollen skin (e.g., insect bite, skin irritation).   - MILD: Joint looks or feels mildly swollen or puffy.   - MODERATE: Swollen; interferes with normal activities (e.g., work or school); decreased range of movement; may be limping.   - SEVERE: Very swollen; can't move swollen joint at all; limping a lot or unable to walk.     severe 4. PAIN: "Is there any pain?" If Yes, ask: "How bad is it?" (Scale 1-10; or mild, moderate, severe)   - NONE (0): no pain.   - MILD (1-3): doesn't interfere with normal activities.    - MODERATE (4-7): interferes with normal activities (e.g., work  or school) or awakens from sleep, limping.    - SEVERE (8-10): excruciating pain, unable to do any normal activities, unable to walk.      Right ankle has discomfort 5. CAUSE: "What do you think caused the ankle swelling?"     Unknown  6. OTHER SYMPTOMS: "Do you have any other symptoms?" (e.g., fever, chest pain, difficulty breathing, calf pain)     no 7. PREGNANCY: "Is there any chance you are pregnant?" "When was your last menstrual period?"     N/a  Protocols used: Ankle Swelling-A-AH

## 2023-12-30 NOTE — Assessment & Plan Note (Signed)
Bilateral Lower Extremity Edema New onset following recent gabapentin initiation. No signs of deep vein thrombosis or heart failure. -Start Lasix once daily for two weeks to reduce fluid retention. -Monitor for return of swelling after discontinuation of Lasix, which may necessitate change in gabapentin therapy.

## 2024-01-02 ENCOUNTER — Ambulatory Visit (HOSPITAL_COMMUNITY)
Admission: RE | Admit: 2024-01-02 | Discharge: 2024-01-02 | Disposition: A | Discharge status: Home / Self Care | Payer: Medicaid Other | Source: Ambulatory Visit | Attending: Physician Assistant | Admitting: Physician Assistant

## 2024-01-02 DIAGNOSIS — R102 Pelvic and perineal pain: Secondary | ICD-10-CM | POA: Diagnosis present

## 2024-01-02 DIAGNOSIS — G8929 Other chronic pain: Secondary | ICD-10-CM | POA: Diagnosis present

## 2024-01-02 MED ORDER — IOHEXOL 300 MG/ML  SOLN
100.0000 mL | Freq: Once | INTRAMUSCULAR | Status: AC | PRN
  Administered 2024-01-02: 100 mL via INTRAVENOUS

## 2024-01-04 NOTE — Assessment & Plan Note (Signed)
History of diverticulitis with severe pain episodes. Currently asymptomatic. -Continue dietary modifications to avoid flare-ups.

## 2024-01-04 NOTE — Assessment & Plan Note (Signed)
Recently completed treatment for UTI. No current symptoms. -Monitor for recurrence of symptoms.

## 2024-01-12 ENCOUNTER — Encounter: Payer: Self-pay | Admitting: Physician Assistant

## 2024-02-04 ENCOUNTER — Ambulatory Visit (INDEPENDENT_AMBULATORY_CARE_PROVIDER_SITE_OTHER): Payer: Medicaid Other | Admitting: Physician Assistant

## 2024-02-04 ENCOUNTER — Encounter: Payer: Self-pay | Admitting: Physician Assistant

## 2024-02-04 VITALS — BP 118/82 | HR 76 | Temp 97.5°F | Resp 18 | Ht 65.0 in | Wt 199.2 lb

## 2024-02-04 DIAGNOSIS — N200 Calculus of kidney: Secondary | ICD-10-CM | POA: Diagnosis not present

## 2024-02-04 DIAGNOSIS — Z0000 Encounter for general adult medical examination without abnormal findings: Secondary | ICD-10-CM | POA: Diagnosis not present

## 2024-02-04 DIAGNOSIS — I700 Atherosclerosis of aorta: Secondary | ICD-10-CM

## 2024-02-04 DIAGNOSIS — Z1211 Encounter for screening for malignant neoplasm of colon: Secondary | ICD-10-CM | POA: Insufficient documentation

## 2024-02-04 DIAGNOSIS — K5732 Diverticulitis of large intestine without perforation or abscess without bleeding: Secondary | ICD-10-CM | POA: Insufficient documentation

## 2024-02-04 DIAGNOSIS — K5730 Diverticulosis of large intestine without perforation or abscess without bleeding: Secondary | ICD-10-CM | POA: Insufficient documentation

## 2024-02-04 LAB — POCT URINALYSIS DIP (CLINITEK)
Bilirubin, UA: NEGATIVE
Blood, UA: NEGATIVE
Glucose, UA: NEGATIVE mg/dL
Ketones, POC UA: NEGATIVE mg/dL
Leukocytes, UA: NEGATIVE
Nitrite, UA: POSITIVE — AB
POC PROTEIN,UA: NEGATIVE
Spec Grav, UA: 1.025 (ref 1.010–1.025)
Urobilinogen, UA: 0.2 U/dL
pH, UA: 5.5 (ref 5.0–8.0)

## 2024-02-04 NOTE — Progress Notes (Signed)
 Subjective:  Patient ID: Patricia Erickson, female    DOB: 04/02/1971  Age: 53 y.o. MRN: [id number]  No chief complaint on file.   HPI Well Adult Physical: Patient here for a comprehensive physical exam.The patient reports  since last being seen did have uti, sepsis and kidney stone removal - doing well now -- however questions on her medical records having aortic atherosclerosis (this was noted on CT urogram 11/24 ) - discussed with patient about low dose statin treatment (pt had elevated lipids in 2023) - will check lipid panel today and further discuss after results Do you take any herbs or supplements that were not prescribed by a doctor? no Are you taking calcium supplements? no Are you taking aspirin daily? no  Encounter for general adult medical examination without abnormal findings  Physical ("At Risk" items are starred): Patient's last physical exam was 1 year ago .  Patient is not afflicted from Stress Incontinence and Urge Incontinence  Patient wears a seat belts Patient has smoke detectors and has carbon monoxide detectors. Patient practices appropriate gun safety. Patient wears sunscreen with extended sun exposure. Dental Care: brushes and flosses daily. Last dental visit: up to date Vision impairments: none Ophthalmology/Optometry: Annual visit.  Hearing loss: none  No LMP recorded (lmp unknown). (Menstrual status: IUD).  Safe at home: Yes Self breast exams: Yes Last pap: up to date - GYN Last mammogram: up to date - last 8/24     06/04/2023    1:31 PM 05/15/2022    8:16 AM 04/02/2022    9:48 AM 01/02/2017   10:25 AM 07/05/2015    4:50 PM  Depression screen PHQ 2/9  Decreased Interest 0 0 0 0 0  Down, Depressed, Hopeless 0 0 0 0 0  PHQ - 2 Score 0 0 0 0 0         06/29/2021   10:05 AM 04/02/2022    9:48 AM 05/13/2022    2:57 PM 05/15/2022    8:15 AM 11/18/2022    8:39 AM  Fall Risk  Falls in the past year?  0  0   Was there an injury with Fall?  0  0   Fall  Risk Category Calculator  0  0   Fall Risk Category (Retired)  Low  Low   (RETIRED) Patient Fall Risk Level Low fall risk Low fall risk Low fall risk Low fall risk Low fall risk  Patient at Risk for Falls Due to    No Fall Risks   Fall risk Follow up    Falls evaluation completed              Social Hx   Social History   Socioeconomic History   Marital status: Divorced    Spouse name: Not on file   Number of children: Not on file   Years of education: 14   Highest education level: Associate degree: occupational, Scientist, product/process development, or vocational program  Occupational History   Not on file  Tobacco Use   Smoking status: Former    Current packs/day: 0.00    Average packs/day: 0.5 packs/day for 7.0 years (3.5 ttl pk-yrs)    Types: Cigarettes    Start date: 10/12/1985    Quit date: 10/12/1992    Years since quitting: 31.3   Smokeless tobacco: Never  Vaping Use   Vaping status: Never Used  Substance and Sexual Activity   Alcohol use: Not Currently   Drug use: No   Sexual activity: Not  Currently    Birth control/protection: I.U.D.    Comment: Mirena 10/29/2012  Other Topics Concern   Not on file  Social History Narrative   Not on file   Social Drivers of Health   Financial Resource Strain: Low Risk  (02/04/2024)   Overall Financial Resource Strain (CARDIA)    Difficulty of Paying Living Expenses: Not hard at all  Food Insecurity: No Food Insecurity (02/04/2024)   Hunger Vital Sign    Worried About Running Out of Food in the Last Year: Never true    Ran Out of Food in the Last Year: Never true  Transportation Needs: No Transportation Needs (02/04/2024)   PRAPARE - Administrator, Civil Service (Medical): No    Lack of Transportation (Non-Medical): No  Physical Activity: Insufficiently Active (02/04/2024)   Exercise Vital Sign    Days of Exercise per Week: 2 days    Minutes of Exercise per Session: 20 min  Stress: No Stress Concern Present (02/04/2024)   [employer] of Occupational [employer] - Occupational Stress Questionnaire    Feeling of Stress : Not at all  Social Connections: Moderately Integrated (02/04/2024)   Social Connection and Isolation Panel [NHANES]    Frequency of Communication with Friends and Family: More than three times a week    Frequency of Social Gatherings with Friends and Family: Once a week    Attends Religious Services: More than 4 times per year    Active Member of Clubs or Organizations: Yes    Attends Banker Meetings: More than 4 times per year    Marital Status: Divorced   Past Medical History:  Diagnosis Date   COVID    History of kidney stones    Hx of sepsis    Migraines    hz of migraines    Polyp of colon 03/11/2007   BENIGN   Past Surgical History:  Procedure Laterality Date   CESAREAN SECTION  12/09/1998   CHOLECYSTECTOMY N/A 10/18/2015   Procedure: LAPAROSCOPIC CHOLECYSTECTOMY;  Surgeon: Rodman Pickle, MD;  Location: [facility];  Service: General;  Laterality: N/A;   CYSTOSCOPY WITH STENT PLACEMENT Left 10/23/2023   Procedure: CYSTOSCOPY WITH RETROGRADE PYELOGRAM STENT PLACEMENT;  Surgeon: Adonis Brook, MD;  Location: [facility];  Service: Urology;  Laterality: Left;   CYSTOSCOPY/URETEROSCOPY/HOLMIUM LASER/STENT PLACEMENT Left 07/31/2020   Procedure: CYSTOSCOPY/URETEROSCOPY/HOLMIUM LASER/STENT PLACEMENT;  Surgeon: Noel Christmas, MD;  Location: [facility];  Service: Urology;  Laterality: Left;  90 MINS   CYSTOSCOPY/URETEROSCOPY/HOLMIUM LASER/STENT PLACEMENT Left 11/13/2023   Procedure: CYSTOSCOPY, LEFT URETEROSCOPY, RETROGRADE PYELOGRAM; HOLMIUM LASER LITHOTRIPSY, AND LEFT URETERAL STENT EXCHANGE;  Surgeon: Adonis Brook, MD;  Location: [facility];  Service: Urology;  Laterality: Left;  45 MINUTES   LAPAROSCOPIC CHOLECYSTECTOMY  10/18/2015    Family History  Problem Relation Age of Onset   Osteoporosis Mother    Breast cancer Mother 77   Colon cancer Mother  41       removed large intestine   Lymphoma Mother 19   Hypertension Father    Osteoporosis Maternal Grandmother    Lymphoma Maternal Grandmother 45   Colon polyps Sister        "several" dx. 24 or younger   Other Sister        hysterectomy at 72; suspicious breast finding removed at 72   Colon cancer Maternal Aunt 43   Lymphoma Maternal Uncle 63   Lung cancer Maternal Grandfather 84  metastatic to bone; former smoker   Throat cancer Paternal Grandmother 74       not a smoker   Heart Problems Paternal Grandfather    Other Other        suspicious breast finding at 68 - recent follow-up normal   Heart Problems Maternal Uncle    Bladder Cancer Paternal Uncle 45   Throat cancer Paternal Uncle        dx. before age 27    ROS CONSTITUTIONAL: Negative for chills, fatigue, fever, unintentional weight gain and unintentional weight loss.  E/N/T: Negative for ear pain, nasal congestion and sore throat.  CARDIOVASCULAR: Negative for chest pain, dizziness, palpitations and pedal edema.  RESPIRATORY: Negative for recent cough and dyspnea.  GASTROINTESTINAL: Negative for abdominal pain, acid reflux symptoms, constipation, diarrhea, nausea and vomiting.  MSK: Negative for arthralgias and myalgias.  INTEGUMENTARY: Negative for rash.  NEUROLOGICAL: Negative for dizziness and headaches.  PSYCHIATRIC: Negative for sleep disturbance and to question depression screen.  Negative for depression, negative for anhedonia.   Objective:  PHYSICAL EXAM:   BP 118/82 (BP Location: Left Arm, Patient Position: Sitting, Cuff Size: Large)   Pulse 76   Temp (!) 97.5 F (36.4 C) (Temporal)   Resp 18   Ht 5\' 5"  (1.651 m)   Wt 199 lb 3.2 oz (90.4 kg)   LMP  (LMP Unknown)   SpO2 98%   BMI 33.15 kg/m   Vision Screening   Right eye Left eye Both eyes  Without correction  20/20 20/20  With correction 20/20      GEN: Well nourished, well developed, in no acute distress  HEENT: normal external ears  and nose - normal external auditory canals and TMS - hearing grossly normal -  - Lips, Teeth and Gums - normal  Oropharynx - normal mucosa, palate, and posterior pharynx  Cardiac: RRR; no murmurs, rubs, or gallops,no edema -  Respiratory:  normal respiratory rate and pattern with no distress - normal breath sounds with no rales, rhonchi, wheezes or rubs GI: normal bowel sounds, no masses or tenderness MS: no deformity or atrophy  Skin: warm and dry, no rash  Neuro:  Alert and Oriented x 3, Strength and sensation are intact - CN II-Xii grossly intact Psych: euthymic mood, appropriate affect and demeanor Office Visit on 02/04/2024  Component Date Value Ref Range Status   Color, UA 02/04/2024 yellow  yellow Final   Clarity, UA 02/04/2024 clear  clear Final   Glucose, UA 02/04/2024 negative  negative mg/dL Final   Bilirubin, UA 16/09/9603 negative  negative Final   Ketones, POC UA 02/04/2024 negative  negative mg/dL Final   Spec Grav, UA 54/08/8118 1.025  1.010 - 1.025 Final   Blood, UA 02/04/2024 negative  negative Final   pH, UA 02/04/2024 5.5  5.0 - 8.0 Final   POC PROTEIN,UA 02/04/2024 negative  negative, trace Final   Urobilinogen, UA 02/04/2024 0.2  0.2 or 1.0 E.U./dL Final   Nitrite, UA 14/78/2956 Positive (A)  Negative Final   Leukocytes, UA 02/04/2024 Negative  Negative Final    Assessment & Plan:  Routine medical exam -     POCT URINALYSIS DIP (CLINITEK) -     CBC with Differential/Platelet -     Comprehensive metabolic panel -     TSH -     Lipid panel  Kidney stone - resolved  Aortic atherosclerosis (HCC) Lipid panel pending Recommend statin treatment - will await lab results   This is a  list of the screening recommended for you and due dates:  Health Maintenance  Topic Date Due   Flu Shot  03/08/2024*   Zoster (Shingles) Vaccine (1 of 2) 05/03/2024*   Mammogram  07/16/2024   DTaP/Tdap/Td vaccine (2 - Td or Tdap) 11/09/2024   Colon Cancer Screening  09/27/2025    Pap with HPV screening  05/16/2027   Hepatitis C Screening  Completed   HIV Screening  Completed   HPV Vaccine  Aged Out   COVID-19 Vaccine  Discontinued  *Topic was postponed. The date shown is not the original due date.     Follow-up: Return in about 1 year (around 02/03/2025) for fasting physical - 20 min.  An After Visit Summary was printed and given to the patient.  Jettie Pagan [facility] [phone]

## 2024-02-05 ENCOUNTER — Encounter: Payer: Self-pay | Admitting: Physician Assistant

## 2024-02-05 LAB — CBC WITH DIFFERENTIAL/PLATELET
Basophils Absolute: 0.1 10*3/uL (ref 0.0–0.2)
Basos: 1 %
EOS (ABSOLUTE): 0.2 10*3/uL (ref 0.0–0.4)
Eos: 2 %
Hematocrit: 44.8 % (ref 34.0–46.6)
Hemoglobin: 14.5 g/dL (ref 11.1–15.9)
Immature Grans (Abs): 0 10*3/uL (ref 0.0–0.1)
Immature Granulocytes: 0 %
Lymphocytes Absolute: 3.3 10*3/uL — ABNORMAL HIGH (ref 0.7–3.1)
Lymphs: 35 %
MCH: 29.1 pg (ref 26.6–33.0)
MCHC: 32.4 g/dL (ref 31.5–35.7)
MCV: 90 fL (ref 79–97)
Monocytes Absolute: 0.7 10*3/uL (ref 0.1–0.9)
Monocytes: 8 %
Neutrophils Absolute: 5.1 10*3/uL (ref 1.4–7.0)
Neutrophils: 54 %
Platelets: 347 10*3/uL (ref 150–450)
RBC: 4.98 x10E6/uL (ref 3.77–5.28)
RDW: 13.6 % (ref 11.7–15.4)
WBC: 9.4 10*3/uL (ref 3.4–10.8)

## 2024-02-05 LAB — COMPREHENSIVE METABOLIC PANEL
ALT: 19 [IU]/L (ref 0–32)
AST: 16 [IU]/L (ref 0–40)
Albumin: 4.8 g/dL (ref 3.8–4.9)
Alkaline Phosphatase: 84 [IU]/L (ref 44–121)
BUN/Creatinine Ratio: 32 — ABNORMAL HIGH (ref 9–23)
BUN: 27 mg/dL — ABNORMAL HIGH (ref 6–24)
Bilirubin Total: 0.2 mg/dL (ref 0.0–1.2)
CO2: 22 mmol/L (ref 20–29)
Calcium: 9.7 mg/dL (ref 8.7–10.2)
Chloride: 106 mmol/L (ref 96–106)
Creatinine, Ser: 0.84 mg/dL (ref 0.57–1.00)
Globulin, Total: 2.4 g/dL (ref 1.5–4.5)
Glucose: 99 mg/dL (ref 70–99)
Potassium: 4.8 mmol/L (ref 3.5–5.2)
Sodium: 145 mmol/L — ABNORMAL HIGH (ref 134–144)
Total Protein: 7.2 g/dL (ref 6.0–8.5)
eGFR: 82 mL/min/{1.73_m2} (ref >59)

## 2024-02-05 LAB — LIPID PANEL
Chol/HDL Ratio: 3.1 {ratio} (ref 0.0–4.4)
Cholesterol, Total: 186 mg/dL (ref 100–199)
HDL: 60 mg/dL (ref >39)
LDL Chol Calc (NIH): 114 mg/dL — ABNORMAL HIGH (ref 0–99)
Triglycerides: 66 mg/dL (ref 0–149)
VLDL Cholesterol Cal: 12 mg/dL (ref 5–40)

## 2024-02-05 LAB — TSH: TSH: 1.37 u[IU]/mL (ref 0.450–4.500)

## 2024-02-20 ENCOUNTER — Ambulatory Visit: Payer: Self-pay | Admitting: Physician Assistant

## 2024-02-20 NOTE — Telephone Encounter (Signed)
 Patient has questions and concerns about blood work after reading her results today. Please call patient   [name]. Topic: Clinical - Request for Lab/Test Order >> Feb 20, 2024  8:54 AM Patricia Erickson wrote: Patient called with questions about her labs from 2/26. Reason for Disposition  [1] Caller requests to speak ONLY to PCP AND [2] NON-URGENT question  Answer Assessment - Initial Assessment Questions 1. REASON FOR CALL or QUESTION: "What is your reason for calling today?" or "How can I best help you?" or "What question do you have that I can help answer?"     Patient calling with questions about her blood work. Patient had questions that this RN was unable to answer. 2. CALLER: Document the source of call. (e.g., laboratory, patient).     patient  Protocols used: PCP Call - No Triage-A-AH

## 2024-02-23 NOTE — Telephone Encounter (Signed)
 Called patient to discuss labs, patient had concerns of why she was told that her labs where normal when her BUN and sodium was elevated. 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

## 2024-02-23 NOTE — Telephone Encounter (Signed)
 Patient was advised that her labwork says cmp stable ---- her BUN was minimally elevated at 27 Pt had kidney stone, sepsis in November.  Told possibly could have been minimally elevated due to that but could have also been slightly up due to fact she was in for appt fasting (it can come from mild dehydration, diet etc) --- told she could come in to repeat this week if she would like repeated at this time otherwise this is a value that could be routinely monitored She was in for visit for a fasting physical and said at that time she was doing well with no complaints --- was not aware of any persistent abdominal pain or other issues  We discussed the findings of aortic atherosclerosis that was noted on her CT urogram (as noted in office note) -- Her LDL was only at 114 and was advised to decrease fried/fatty foods in diet and at this point diet and lifestyle modifications can be made or if she chooses to start low dose statin we could do that Patient became frustrated with nurse and hung up phone stating she did not want to make appt and did not know if she would return

## 2024-06-18 ENCOUNTER — Emergency Department (HOSPITAL_COMMUNITY)

## 2024-06-18 ENCOUNTER — Other Ambulatory Visit: Payer: Self-pay

## 2024-06-18 ENCOUNTER — Emergency Department (HOSPITAL_COMMUNITY)
Admission: EM | Admit: 2024-06-18 | Discharge: 2024-06-18 | Disposition: A | Discharge status: Home / Self Care | Attending: Emergency Medicine | Admitting: Emergency Medicine

## 2024-06-18 DIAGNOSIS — K5732 Diverticulitis of large intestine without perforation or abscess without bleeding: Secondary | ICD-10-CM | POA: Insufficient documentation

## 2024-06-18 DIAGNOSIS — R072 Precordial pain: Secondary | ICD-10-CM | POA: Insufficient documentation

## 2024-06-18 DIAGNOSIS — R109 Unspecified abdominal pain: Secondary | ICD-10-CM | POA: Diagnosis present

## 2024-06-18 DIAGNOSIS — Z8616 Personal history of COVID-19: Secondary | ICD-10-CM | POA: Diagnosis not present

## 2024-06-18 LAB — CBC WITH DIFFERENTIAL/PLATELET
Abs Immature Granulocytes: 0.08 K/uL — ABNORMAL HIGH (ref 0.00–0.07)
Basophils Absolute: 0 K/uL (ref 0.0–0.1)
Basophils Relative: 0 %
Eosinophils Absolute: 0.1 K/uL (ref 0.0–0.5)
Eosinophils Relative: 1 %
HCT: 38.8 % (ref 36.0–46.0)
Hemoglobin: 13 g/dL (ref 12.0–15.0)
Immature Granulocytes: 1 %
Lymphocytes Relative: 10 %
Lymphs Abs: 1.5 K/uL (ref 0.7–4.0)
MCH: 29.5 pg (ref 26.0–34.0)
MCHC: 33.5 g/dL (ref 30.0–36.0)
MCV: 88.2 fL (ref 80.0–100.0)
Monocytes Absolute: 1.4 K/uL — ABNORMAL HIGH (ref 0.1–1.0)
Monocytes Relative: 10 %
Neutro Abs: 11.5 K/uL — ABNORMAL HIGH (ref 1.7–7.7)
Neutrophils Relative %: 78 %
Platelets: 285 K/uL (ref 150–400)
RBC: 4.4 MIL/uL (ref 3.87–5.11)
RDW: 13.6 % (ref 11.5–15.5)
WBC: 14.6 K/uL — ABNORMAL HIGH (ref 4.0–10.5)
nRBC: 0 % (ref 0.0–0.2)

## 2024-06-18 LAB — TROPONIN I (HIGH SENSITIVITY)
Troponin I (High Sensitivity): <2 ng/L (ref <18)
Troponin I (High Sensitivity): 3 ng/L (ref <18)

## 2024-06-18 LAB — URINALYSIS, ROUTINE W REFLEX MICROSCOPIC
Bilirubin Urine: NEGATIVE
Glucose, UA: NEGATIVE mg/dL
Hgb urine dipstick: NEGATIVE
Ketones, ur: 15 mg/dL — AB
Leukocytes,Ua: NEGATIVE
Nitrite: NEGATIVE
Protein, ur: NEGATIVE mg/dL
Specific Gravity, Urine: >1.03 — ABNORMAL HIGH (ref 1.005–1.030)
pH: 6 (ref 5.0–8.0)

## 2024-06-18 LAB — COMPREHENSIVE METABOLIC PANEL WITH GFR
ALT: 30 U/L (ref 0–44)
AST: 17 U/L (ref 15–41)
Albumin: 3.5 g/dL (ref 3.5–5.0)
Alkaline Phosphatase: 72 U/L (ref 38–126)
Anion gap: 10 (ref 5–15)
BUN: 17 mg/dL (ref 6–20)
CO2: 23 mmol/L (ref 22–32)
Calcium: 8.8 mg/dL — ABNORMAL LOW (ref 8.9–10.3)
Chloride: 102 mmol/L (ref 98–111)
Creatinine, Ser: 0.67 mg/dL (ref 0.44–1.00)
GFR, Estimated: >60 mL/min (ref >60)
Glucose, Bld: 120 mg/dL — ABNORMAL HIGH (ref 70–99)
Potassium: 3.6 mmol/L (ref 3.5–5.1)
Sodium: 135 mmol/L (ref 135–145)
Total Bilirubin: 0.5 mg/dL (ref 0.0–1.2)
Total Protein: 7.1 g/dL (ref 6.5–8.1)

## 2024-06-18 LAB — LIPASE, BLOOD: Lipase: 27 U/L (ref 11–51)

## 2024-06-18 MED ORDER — ONDANSETRON 4 MG PO TBDP: 4.0000 mg | ORAL_TABLET | Freq: Three times a day (TID) | ORAL | 0 refills | Status: DC | PRN

## 2024-06-18 MED ORDER — MORPHINE SULFATE (PF) 4 MG/ML IV SOLN
4.0000 mg | Freq: Once | INTRAVENOUS | Status: AC
  Administered 2024-06-18: 4 mg via INTRAVENOUS
  Filled 2024-06-18: qty 1

## 2024-06-18 MED ORDER — SODIUM CHLORIDE 0.9 % IV BOLUS
1000.0000 mL | Freq: Once | INTRAVENOUS | Status: AC
  Administered 2024-06-18: 1000 mL via INTRAVENOUS

## 2024-06-18 MED ORDER — ONDANSETRON HCL 4 MG/2ML IJ SOLN
4.0000 mg | Freq: Once | INTRAMUSCULAR | Status: AC
  Administered 2024-06-18: 4 mg via INTRAVENOUS
  Filled 2024-06-18: qty 2

## 2024-06-18 MED ORDER — DICYCLOMINE HCL 20 MG PO TABS: 20.0000 mg | ORAL_TABLET | Freq: Two times a day (BID) | ORAL | 0 refills | Status: DC

## 2024-06-18 MED ORDER — IOHEXOL 350 MG/ML SOLN
100.0000 mL | Freq: Once | INTRAVENOUS | Status: AC | PRN
  Administered 2024-06-18: 100 mL via INTRAVENOUS

## 2024-06-18 MED ORDER — AMOXICILLIN-POT CLAVULANATE 875-125 MG PO TABS: 1.0000 | ORAL_TABLET | Freq: Two times a day (BID) | ORAL | 0 refills | Status: DC

## 2024-06-18 NOTE — ED Notes (Signed)
 Patient refused CT c/o mid chest pain that has happened 3x since 1100. She states pain took her breath away, described as a light a tightness that radiated to back asking for provider to see her prior to going to CT. RN at beside patient is A&O, no distress, clear speech, denies numbness or tingling in BUE, care on going provider notified.

## 2024-06-18 NOTE — ED Triage Notes (Signed)
 Pt arrived reporting mid lower abdominal pain that started this Monday. Endorses fever for two days, none today. No other symptoms. Hx of diverticulitis.

## 2024-06-18 NOTE — ED Provider Notes (Signed)
 [place] [facility]GENC[facility] DEPARTMENT AT [facility] Provider Note   CSN: [id number] Arrival date & time: 06/18/24  9093     Patient presents with: Abdominal Pain and Nausea   Patricia Erickson is a 53 y.o. female.   Patient with history of kidney stones, diverticulitis presents today with complaints of abdominal pain.  She reports that on Monday evening she felt like she had increased flatus, however did not have any significant pain until Wednesday.  Reports she has had significant pain since then.  Pain is mostly in the right lower quadrant but does also radiate to the left lower quadrant.  Reports pain does not feel similar to her previous flares of diverticulitis or her kidney stones.  Endorses nausea without vomiting or diarrhea. No urinary symptoms. Does report history of cholecystectomy and C-section.  The history is provided by the patient. No language interpreter was used.  Abdominal Pain      Prior to Admission medications   Medication Sig Start Date End Date Taking? Authorizing Provider  Bacillus Coagulans-Inulin (PROBIOTIC-PREBIOTIC PO) Take 3.6 g by mouth daily.    [provider]  Collagen-Vitamin C-Biotin (COLLAGEN PO) Take 1 capsule by mouth daily.    [provider]  FIBER PO Take 1 Scoop by mouth See admin instructions. Mix 1 scoopful into 4-6 ounces of a desired beverage and drink by mouth once a day    [provider]  gabapentin  (NEURONTIN ) 300 MG capsule Take 300 mg by mouth 2 (two) times daily. 12/26/23   [provider]  levonorgestrel  (MIRENA , 52 MG,) 20 MCG/DAY IUD 1 each by Intrauterine route See admin instructions. Place 1 new device in the uterus every 8 years 12/09/17   [provider]  Meth-Hyo-M Bl-Na Phos-Ph Sal (URO-MP) 118 MG CAPS Take 1 capsule by mouth 3 (three) times daily as needed (for bladder irritation).    [provider]  prochlorperazine  (COMPAZINE ) 10 MG tablet Take 1 tablet (10 mg  total) by mouth 2 (two) times daily as needed for nausea or vomiting. 07/16/18 08/26/19  Tegeler, Lonni PARAS, MD    Allergies: Other and Propoxyphene n-acetaminophen     Review of Systems  Gastrointestinal:  Positive for abdominal pain.  All other systems reviewed and are negative.   Updated Vital Signs BP (!) 144/92 (BP Location: Right Arm)   Pulse (!) 109   Temp 98.1 F (36.7 C) (Oral)   Resp 16   Ht 5' 5 (1.651 m)   Wt 90.4 kg   SpO2 98%   BMI 33.16 kg/m   Physical Exam Vitals and nursing note reviewed.  Constitutional:      General: She is not in acute distress.    Appearance: Normal appearance. She is normal weight. She is not ill-appearing, toxic-appearing or diaphoretic.  HENT:     Head: Normocephalic and atraumatic.  Cardiovascular:     Rate and Rhythm: Normal rate.  Pulmonary:     Effort: Pulmonary effort is normal. No respiratory distress.  Abdominal:     General: Abdomen is flat.     Palpations: Abdomen is soft.     Tenderness: There is abdominal tenderness in the right lower quadrant, periumbilical area and left lower quadrant.  Musculoskeletal:        General: Normal range of motion.     Cervical back: Normal range of motion.  Skin:    General: Skin is warm and dry.  Neurological:     General: No focal deficit present.  Mental Status: She is alert.  Psychiatric:        Mood and Affect: Mood normal.        Behavior: Behavior normal.     (all labs ordered are listed, but only abnormal results are displayed) Labs Reviewed  COMPREHENSIVE METABOLIC PANEL WITH GFR - Abnormal; Notable for the following components:      Result Value   Glucose, Bld 120 (*)    Calcium 8.8 (*)    All other components within normal limits  CBC WITH DIFFERENTIAL/PLATELET - Abnormal; Notable for the following components:   WBC 14.6 (*)    Neutro Abs 11.5 (*)    Monocytes Absolute 1.4 (*)    Abs Immature Granulocytes 0.08 (*)    All other components within normal limits   URINALYSIS, ROUTINE W REFLEX MICROSCOPIC - Abnormal; Notable for the following components:   Specific Gravity, Urine >1.030 (*)    Ketones, ur 15 (*)    All other components within normal limits  LIPASE, BLOOD  TROPONIN I (HIGH SENSITIVITY)  TROPONIN I (HIGH SENSITIVITY)    EKG: None  Radiology: CT Angio Chest/Abd/Pel for Dissection W and/or Wo Contrast Result Date: 06/18/2024 CLINICAL DATA:  Acute aortic syndrome (AAS) suspected. Mid lower abdominal pain. Fever. Sudden chest pain. EXAM: CT ANGIOGRAPHY CHEST, ABDOMEN AND PELVIS TECHNIQUE: Non-contrast CT of the chest was initially obtained. Multidetector CT imaging through the chest, abdomen and pelvis was performed using the standard protocol during bolus administration of intravenous contrast. Multiplanar reconstructed images and MIPs were obtained and reviewed to evaluate the vascular anatomy. RADIATION DOSE REDUCTION: This exam was performed according to the departmental dose-optimization program which includes automated exposure control, adjustment of the mA and/or kV according to patient size and/or use of iterative reconstruction technique. CONTRAST:  OMNIPAQUE  IOHEXOL  350 MG/ML SOLN COMPARISON:  CT scan renal stone protocol from 10/23/2023. FINDINGS: CTA CHEST FINDINGS Cardiovascular: No intramural hematoma noted in the thoracic aorta on the unenhanced images. Thoracic aorta is normal in caliber without aneurysm, dissection, vasculitis or significant stenosis. Normal cardiac size. No pericardial effusion. No aortic aneurysm. Mediastinum/Nodes: Visualized thyroid  gland appears grossly unremarkable. No solid / cystic mediastinal masses. The esophagus is nondistended precluding optimal assessment. There are few mildly prominent mediastinal lymph nodes, which do not meet the size criteria for lymphadenopathy and though indeterminate most likely benign in etiology. No axillary or hilar lymphadenopathy by size criteria. Lungs/Pleura: The  central tracheo-bronchial tree is patent. There are patchy areas of linear, plate-like atelectasis and/or scarring throughout bilateral lungs. No mass or consolidation. No pleural effusion or pneumothorax. There is a 4 x 6 mm solid noncalcified opacity in the minor fissure, which is present since the prior study dating back to 2016 and favored to represent intra fissural lymph node. No new or suspicious lung nodule. Musculoskeletal: The visualized soft tissues of the chest wall are grossly unremarkable. No suspicious osseous lesions. There are mild multilevel degenerative changes in the visualized spine. Review of the MIP images confirms the above findings. CTA ABDOMEN AND PELVIS FINDINGS VASCULAR Aorta: Normal caliber aorta without aneurysm, dissection, vasculitis or significant stenosis. Celiac: Patent without evidence of aneurysm, dissection, vasculitis or significant stenosis. SMA: Patent without evidence of aneurysm, dissection, vasculitis or significant stenosis. Renals: Single right and double left renal arteries noted. 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 All renal arteries are patent without evidence of aneurysm, dissection, vasculitis, fibromuscular dysplasia or significant stenosis. IMA: Patent without evidence of aneurysm, dissection, vasculitis or significant  stenosis. Inflow: Patent without evidence of aneurysm, dissection, vasculitis or significant stenosis. Veins: No obvious venous abnormality within the limitations of this arterial phase study. Review of the MIP images confirms the above findings. NON-VASCULAR Hepatobiliary: The liver is normal in size. Non-cirrhotic configuration. No suspicious mass. No intrahepatic bile duct dilation. There is mild prominence of the extrahepatic bile duct, most likely due to post cholecystectomy status. Gallbladder is surgically absent. Pancreas: Unremarkable. No pancreatic ductal dilatation or surrounding inflammatory changes. Spleen: Within  normal limits. No focal lesion. Adrenals/Urinary Tract: Adrenal glands are unremarkable. No suspicious renal mass. No hydronephrosis. No renal or ureteric calculi. Urinary bladder is under distended, precluding optimal assessment. However, no large mass or stones identified. No perivesical fat stranding. Stomach/Bowel: There is an approximately 10 cm long segment of proximal sigmoid colon exhibiting moderate irregular wall thickening and pericolonic fat stranding on the background of several diverticula, compatible with acute uncomplicated diverticulitis. No walled off abscess or loculated collection. No pneumoperitoneum. No disproportionate dilation of small or large bowel loops to suggest bowel obstruction. Unremarkable appendix. Vascular/Lymphatic: No ascites or pneumoperitoneum. There are multiple prominent retroperitoneal lymph nodes with largest in the left para-aortic region measuring up to 8 x 11 mm, which is favored benign/reactive in the given clinical setting of acute diverticulitis. No aneurysmal dilation of the major abdominal arteries. Reproductive: Normal-size anteverted uterus noted. There is a T-shaped intrauterine device, which appears in satisfactory position. No adnexal mass. Other: There is a tiny fat containing umbilical hernia. The soft tissues and abdominal wall are otherwise unremarkable. Musculoskeletal: No suspicious osseous lesions. There are mild multilevel degenerative changes in the visualized spine. Review of the MIP images confirms the above findings. IMPRESSION: 1. No acute aortic syndrome. No thoracic or abdominal aortic aneurysm, dissection or vasculitis. No acute inflammatory process identified within the chest. 2. Acute uncomplicated proximal sigmoid diverticulitis, as described in detail above. 3. Multiple other nonacute observations, as described above. Electronically Signed   By: Ree Molt M.D.   On: 06/18/2024 13:28     Procedures   Medications Ordered in the ED   morphine  (PF) 4 MG/ML injection 4 mg (4 mg Intravenous Given 06/18/24 1009)  ondansetron  (ZOFRAN ) injection 4 mg (4 mg Intravenous Given 06/18/24 1009)  sodium chloride  0.9 % bolus 1,000 mL (0 mLs Intravenous Stopped 06/18/24 1354)  morphine  (PF) 4 MG/ML injection 4 mg (4 mg Intravenous Given 06/18/24 1151)  iohexol  (OMNIPAQUE ) 350 MG/ML injection 100 mL (100 mLs Intravenous Contrast Given 06/18/24 1248)                                    Medical Decision Making Amount and/or Complexity of Data Reviewed Labs: ordered. Radiology: ordered.  Risk Prescription drug management.   This patient is a 54 y.o. female who presents to the ED for concern of abdominal pain, this involves an extensive number of treatment options, and is a complaint that carries with it a high risk of complications and morbidity. The emergent differential diagnosis prior to evaluation includes, but is not limited to,  The differential diagnosis for generalized abdominal pain includes, but is not limited to AAA, gastroenteritis, appendicitis, Bowel obstruction, Bowel perforation. Gastroparesis, DKA, Hernia, Inflammatory bowel disease, mesenteric ischemia, pancreatitis, peritonitis SBP, volvulus, diverticulitis.  This is not an exhaustive differential.   Past Medical History / Co-morbidities / Social History:  has a past medical history of COVID, History of kidney stones, sepsis,  Migraines, and Polyp of colon (03/11/2007).  Additional history: Chart reviewed.  Physical Exam: Physical exam performed. The pertinent findings include: generalized lower abdominal TTP  Lab Tests: I ordered, and personally interpreted labs.  The pertinent results include:  WBC 14.6, UA with ketones, noninfectious. Troponin <2 --> 3   Imaging Studies: I ordered imaging studies including CTA chest abdomen pelvis. I independently visualized and interpreted imaging which showed   1. No acute aortic syndrome. No thoracic or abdominal aortic  aneurysm, dissection or vasculitis. No acute inflammatory process identified within the chest. 2. Acute uncomplicated proximal sigmoid diverticulitis, as described in detail above. 3. Multiple other nonacute observations, as described above.  I agree with the radiologist interpretation.   Cardiac Monitoring:  The patient was maintained on a cardiac monitor. Cardiac monitor showed an underlying rhythm of: sinus rhythm, no STEMI. I agree with this interpretation.   Medications: I ordered medication including morphine , zofran , fluids  for pain, nausea, dehydration. Reevaluation of the patient after these medicines showed that the patient improved. I have reviewed the patients home medicines and have made adjustments as needed.   Disposition: After consideration of the diagnostic results and the patients response to treatment, I feel that emergency department workup does not suggest an emergent condition requiring admission or immediate intervention beyond what has been performed at this time. The plan is: discharge with close outpatient follow-up and return precautions. Patients symptoms consistent with diverticulitis, will treat for same with Augmentin . Of note, at around 11 am after I had already seen the patient, she reported to staff that she had began to have midsternal chest pain that was radiating to her back. EKG was repeated and showed no ischemic changes. Troponin negative, delta collected given onset time. Upon reassessment after work-up completed, her chest pain has improved, she has a low risk HEART score with benign CTA. Discussed same with patient is understanding and in agreement.  She feels ready to go home. Evaluation and diagnostic testing in the emergency department does not suggest an emergent condition requiring admission or immediate intervention beyond what has been performed at this time.  Plan for discharge with close PCP follow-up.  Patient is understanding and amenable with  plan, educated on red flag symptoms that would prompt immediate return.  Patient discharged in stable condition.  Final diagnoses:  Diverticulitis large intestine w/o perforation or abscess w/o bleeding  Precordial chest pain    ED Discharge Orders          Ordered    amoxicillin -clavulanate (AUGMENTIN ) 875-125 MG tablet  Every 12 hours        06/18/24 1459    ondansetron  (ZOFRAN -ODT) 4 MG disintegrating tablet  Every 8 hours PRN        06/18/24 1459    dicyclomine  (BENTYL ) 20 MG tablet  2 times daily        06/18/24 1459          An After Visit Summary was printed and given to the patient.      Macy Lingenfelter A, PA-C 06/18/24 1500    Suzette Pac, MD 06/19/24 [phone]

## 2024-06-18 NOTE — ED Notes (Signed)
 Urine cup left at bedside pt aware sample is needed

## 2024-06-18 NOTE — Discharge Instructions (Addendum)
 As we discussed, your workup in the ER today was reassuring for acute findings.  CT imaging did show that you have diverticulitis which is likely the cause of your symptoms.  I have given you a prescription for the antibiotics Augmentin  which you need to take as prescribed in its entirety for management of this.  I have also given you a prescription for Bentyl  which can take for stomach cramps and Zofran  you can take as needed for nausea/vomiting.  Please call your PCP to schedule a close follow-up appointment.  Return if development of any new or worsening symptoms.

## 2024-06-25 ENCOUNTER — Inpatient Hospital Stay: Admitting: Physician Assistant

## 2024-06-25 ENCOUNTER — Ambulatory Visit (INDEPENDENT_AMBULATORY_CARE_PROVIDER_SITE_OTHER): Admitting: Physician Assistant

## 2024-06-25 ENCOUNTER — Encounter: Payer: Self-pay | Admitting: Physician Assistant

## 2024-06-25 VITALS — BP 118/84 | HR 73 | Temp 98.4°F | Ht 66.0 in | Wt 205.8 lb

## 2024-06-25 DIAGNOSIS — E669 Obesity, unspecified: Secondary | ICD-10-CM | POA: Diagnosis not present

## 2024-06-25 DIAGNOSIS — K5792 Diverticulitis of intestine, part unspecified, without perforation or abscess without bleeding: Secondary | ICD-10-CM

## 2024-06-25 DIAGNOSIS — R739 Hyperglycemia, unspecified: Secondary | ICD-10-CM | POA: Diagnosis not present

## 2024-06-25 NOTE — Assessment & Plan Note (Signed)
 Reviewed glp-1 MOA ,SE ,use.   - Refer to [facility] for dietary counseling and meal planning. - Check A1c to assess for prediabetes or diabetes. - If A1c is stable, initiate Wegovy at 0.25 mg weekly, with gradual dose increase based on tolerance and progress.

## 2024-06-25 NOTE — Progress Notes (Signed)
 New patient visit   Patient: Patricia Erickson   DOB: 09/15/1971   53 y.o. Female  MRN: [id number] Visit Date: 06/25/2024  Today's healthcare provider: Manuelita Flatness, PA-C   Cc. New pt, ED follow up  Subjective    Patricia Erickson is a 53 y.o. female who presents today as a new patient to establish care.  Discussed the use of AI scribe software for clinical note transcription with the patient, who gave verbal consent to proceed.  History of Present Illness   Patricia Erickson is a 54 year old female with diverticulitis who presents for follow-up after a recent ER visit.  She experienced a flare-up of diverticulitis with atypical more central abdominal pain, differing from her usual left-sided pain. This marks her third hospitalization for diverticulitis, with an additional episode managed at home. She had some questions regarding the CT scan results.  There is a significant family history of cancer, mother with breast, colon cancer and lymphoma, aunt with colon cancer. She has had colon cancer related genetic testing previously.  She is concerned about her weight, which she attributes to dietary restrictions due to diverticulitis and lifestyle changes after losing her job in December. She avoids soda, red meat, and salads, preferring fresh foods like [place] and chicken, and does not consume processed foods or sweets. Physical activity is limited by plantar fasciitis and recent illness, but she does walk her dog, and is starting to incorporate a kettle bell /weight routine.  She feels tired all the time, attributing this to her weight. She sleeps well and does not consume caffeine or energy drinks.        Past Medical History:  Diagnosis Date   COVID    History of kidney stones    Hx of sepsis    Migraines    hz of migraines    Polyp of colon 03/11/2007   BENIGN   Past Surgical History:  Procedure Laterality Date   CESAREAN SECTION  12/09/1998   CHOLECYSTECTOMY N/A  10/18/2015   Procedure: LAPAROSCOPIC CHOLECYSTECTOMY;  Surgeon: Herlene Beverley Bureau, MD;  Location: [facility];  Service: General;  Laterality: N/A;   CYSTOSCOPY WITH STENT PLACEMENT Left 10/23/2023   Procedure: CYSTOSCOPY WITH RETROGRADE PYELOGRAM STENT PLACEMENT;  Surgeon: Shane Steffan BROCKS, MD;  Location: [facility];  Service: Urology;  Laterality: Left;   CYSTOSCOPY/URETEROSCOPY/HOLMIUM LASER/STENT PLACEMENT Left 07/31/2020   Procedure: CYSTOSCOPY/URETEROSCOPY/HOLMIUM LASER/STENT PLACEMENT;  Surgeon: Elisabeth Valli BIRCH, MD;  Location: [facility];  Service: Urology;  Laterality: Left;  90 MINS   CYSTOSCOPY/URETEROSCOPY/HOLMIUM LASER/STENT PLACEMENT Left 11/13/2023   Procedure: CYSTOSCOPY, LEFT URETEROSCOPY, RETROGRADE PYELOGRAM; HOLMIUM LASER LITHOTRIPSY, AND LEFT URETERAL STENT EXCHANGE;  Surgeon: Shane Steffan BROCKS, MD;  Location: [facility];  Service: Urology;  Laterality: Left;  45 MINUTES   LAPAROSCOPIC CHOLECYSTECTOMY  10/18/2015   Family Status  Relation Name Status   Mother  Alive   Father Marvelyn Bouchillon Alive   MGM  Deceased at age 61       lymphoma   Sister  Alive   Mat Aunt  Deceased at age 75       colon cancer   Mat Uncle  Deceased at age 61       lymphoma   Pat Aunt  Alive   Bruna Brigham  Alive   MGF  Deceased at age 64       metastatic lung cancer   PGM  Deceased at age 14  throat cancer   PGF  Deceased at age 4-77       heart-related   Son  Alive   Other Niece Alive   Mat Uncle  Alive   Pat Uncle  Alive  No partnership data on file   Family History  Problem Relation Age of Onset   Osteoporosis Mother    Breast cancer Mother 62   Colon cancer Mother 23       removed large intestine   Lymphoma Mother 56   Hypertension Father    Osteoporosis Maternal Grandmother    Lymphoma Maternal Grandmother 49   Colon polyps Sister        several dx. 70 or younger   Other Sister        hysterectomy at 63; suspicious breast finding removed at 28   Colon  cancer Maternal Aunt 43   Lymphoma Maternal Uncle 63   Lung cancer Maternal Grandfather 66       metastatic to bone; former smoker   Throat cancer Paternal Grandmother 37       not a smoker   Heart Problems Paternal Grandfather    Other Other        suspicious breast finding at 71 - recent follow-up normal   Heart Problems Maternal Uncle    Bladder Cancer Paternal Uncle 46   Throat cancer Paternal Uncle        dx. before age 63   Social History   Socioeconomic History   Marital status: Divorced    Spouse name: Not on file   Number of children: Not on file   Years of education: 14   Highest education level: Some college, no degree  Occupational History   Not on file  Tobacco Use   Smoking status: Former    Current packs/day: 0.00    Average packs/day: 0.5 packs/day for 7.0 years (3.5 ttl pk-yrs)    Types: Cigarettes    Start date: 10/12/1985    Quit date: 10/12/1992    Years since quitting: 31.7   Smokeless tobacco: Never  Vaping Use   Vaping status: Never Used  Substance and Sexual Activity   Alcohol use: Not Currently   Drug use: No   Sexual activity: Not Currently    Birth control/protection: I.U.D.    Comment: Mirena  10/29/2012  Other Topics Concern   Not on file  Social History Narrative   Not on file   Social Drivers of Health   Financial Resource Strain: High Risk (06/24/2024)   Overall Financial Resource Strain (CARDIA)    Difficulty of Paying Living Expenses: Hard  Food Insecurity: No Food Insecurity (06/24/2024)   Hunger Vital Sign    Worried About Running Out of Food in the Last Year: Never true    Ran Out of Food in the Last Year: Never true  Transportation Needs: No Transportation Needs (06/24/2024)   PRAPARE - Administrator, Civil Service (Medical): No    Lack of Transportation (Non-Medical): No  Physical Activity: Inactive (06/24/2024)   Exercise Vital Sign    Days of Exercise per Week: 0 days    Minutes of Exercise per Session: Not on  file  Stress: No Stress Concern Present (06/24/2024)   [employer] of Occupational Health - Occupational Stress Questionnaire    Feeling of Stress: Not at all  Social Connections: Moderately Integrated (06/24/2024)   Social Connection and Isolation Panel    Frequency of Communication with Friends and Family: More than three times a  week    Frequency of Social Gatherings with Friends and Family: Three times a week    Attends Religious Services: More than 4 times per year    Active Member of Clubs or Organizations: Yes    Attends Banker Meetings: 1 to 4 times per year    Marital Status: Divorced   Outpatient Medications Prior to Visit  Medication Sig   Bacillus Coagulans-Inulin (PROBIOTIC-PREBIOTIC PO) Take 3.6 g by mouth daily.   Collagen-Vitamin C-Biotin (COLLAGEN PO) Take 1 capsule by mouth daily.   FIBER PO Take 1 Scoop by mouth See admin instructions. Mix 1 scoopful into 4-6 ounces of a desired beverage and drink by mouth once a day   gabapentin  (NEURONTIN ) 300 MG capsule Take 300 mg by mouth 2 (two) times daily.   levonorgestrel  (MIRENA , 52 MG,) 20 MCG/DAY IUD 1 each by Intrauterine route See admin instructions. Place 1 new device in the uterus every 8 years   Meth-Hyo-M Bl-Na Phos-Ph Sal (URO-MP) 118 MG CAPS Take 1 capsule by mouth 3 (three) times daily as needed (for bladder irritation).   amoxicillin -clavulanate (AUGMENTIN ) 875-125 MG tablet Take 1 tablet by mouth every 12 (twelve) hours. (Patient not taking: Reported on 06/25/2024)   dicyclomine  (BENTYL ) 20 MG tablet Take 1 tablet (20 mg total) by mouth 2 (two) times daily. (Patient not taking: Reported on 06/25/2024)   ondansetron  (ZOFRAN -ODT) 4 MG disintegrating tablet Take 1 tablet (4 mg total) by mouth every 8 (eight) hours as needed. (Patient not taking: Reported on 06/25/2024)   No facility-administered medications prior to visit.   Allergies  Allergen Reactions   Other Other (See Comments)    Darvocet.    Propoxyphene N-Acetaminophen  Nausea And Vomiting    Immunization History  Administered Date(s) Administered   Tdap 11/09/2014    Health Maintenance  Topic Date Due   Hepatitis B Vaccines (1 of 3 - 19+ 3-dose series) Never done   Zoster Vaccines- Shingrix (1 of 2) 09/25/2024 (Originally 01/22/1988)   INFLUENZA VACCINE  07/09/2024   MAMMOGRAM  07/16/2024   DTaP/Tdap/Td (2 - Td or Tdap) 11/09/2024   Colonoscopy  09/27/2025   Cervical Cancer Screening (HPV/Pap Cotest)  05/16/2027   Hepatitis C Screening  Completed   HIV Screening  Completed   HPV VACCINES  Aged Out   Meningococcal B Vaccine  Aged Out   COVID-19 Vaccine  Discontinued    Patient Care Team: Cyndi Shaver, PA-C as PCP - General (Physician Assistant)  Review of Systems  Constitutional:  Negative for fatigue and fever.  Respiratory:  Negative for cough and shortness of breath.   Cardiovascular:  Negative for chest pain and leg swelling.  Gastrointestinal:  Negative for abdominal pain.  Neurological:  Negative for dizziness and headaches.        Objective    BP 118/84   Pulse 73   Temp 98.4 F (36.9 C) (Oral)   Ht 5' 6 (1.676 m)   Wt 205 lb 12.8 oz (93.4 kg)   SpO2 97%   BMI 33.22 kg/m     Physical Exam Constitutional:      General: She is awake.     Appearance: She is well-developed.  HENT:     Head: Normocephalic.  Eyes:     Conjunctiva/sclera: Conjunctivae normal.  Cardiovascular:     Rate and Rhythm: Normal rate and regular rhythm.     Heart sounds: Normal heart sounds.  Pulmonary:     Effort: Pulmonary effort is normal.  Breath sounds: Normal breath sounds.  Abdominal:     Palpations: Abdomen is soft.     Tenderness: There is no abdominal tenderness. There is no guarding.  Skin:    General: Skin is warm.  Neurological:     Mental Status: She is alert and oriented to person, place, and time.  Psychiatric:        Attention and Perception: Attention normal.        Mood and  Affect: Mood normal.        Speech: Speech normal.        Behavior: Behavior is cooperative.     Depression Screen    06/25/2024    2:20 PM 06/04/2023    1:31 PM 05/15/2022    8:16 AM 04/02/2022    9:48 AM  PHQ 2/9 Scores  PHQ - 2 Score 1 0 0 0   No results found for any visits on 06/25/24.  Assessment & Plan     Hyperglycemia -     Hemoglobin A1c -     Amb ref to Medical Nutrition Therapy-MNT  Diverticulitis Reviewed recent ED visit and CT scan at length with patient.    Recommend pt see her GI given recurrent flares Reviewed diet   Reviewed CT scan  There is an approximately 10 cm long segment of proximal sigmoid colon exhibiting moderate irregular wall thickening and pericolonic fat stranding on the background of several diverticula, compatible with acute uncomplicated diverticulitis. No walled off abscess or loculated collection. No pneumoperitoneum. No disproportionate dilation of small or large bowel loops to suggest bowel obstruction   Other ct scan findings --  There are few mildly prominent mediastinal lymph nodes, which do not meet the size criteria for lymphadenopathy and though indeterminate most likely benign in etiology. No axillary or hilar lymphadenopathy by size criteria. --reassured benign   There is a tiny fat containing umbilical hernia  -reassured benign   -     CBC with Differential/Platelet -     Basic metabolic panel with GFR -     Amb ref to Medical Nutrition Therapy-MNT  Obesity (BMI 30-39.9) Assessment & Plan: Follows a healthy diet, avoiding red meat and processed foods due to diverticulitis. Drinks water exclusively, avoids soda.Walks her dog and is incorportating weight lifting.   Reviewed glp-1 MOA ,SE ,use.   - Refer to [facility] for dietary counseling and meal planning. - Check A1c to assess for prediabetes or diabetes. - If A1c is stable, initiate Wegovy at 0.25 mg weekly, with gradual dose increase  based on tolerance and progress.  Return in about 3 months (around 09/25/2024), or if symptoms worsen or fail to improve, for weight Management.      Manuelita Flatness, PA-C  [facility] at [facility] [phone] (phone) [phone] (fax)  [facility]

## 2024-06-26 LAB — CBC WITH DIFFERENTIAL/PLATELET
Basophils Absolute: 0.1 x10E3/uL (ref 0.0–0.2)
Basos: 1 %
EOS (ABSOLUTE): 0.2 x10E3/uL (ref 0.0–0.4)
Eos: 3 %
Hematocrit: 41.1 % (ref 34.0–46.6)
Hemoglobin: 13 g/dL (ref 11.1–15.9)
Immature Grans (Abs): 0 x10E3/uL (ref 0.0–0.1)
Immature Granulocytes: 0 %
Lymphocytes Absolute: 3.3 x10E3/uL — ABNORMAL HIGH (ref 0.7–3.1)
Lymphs: 41 %
MCH: 28.5 pg (ref 26.6–33.0)
MCHC: 31.6 g/dL (ref 31.5–35.7)
MCV: 90 fL (ref 79–97)
Monocytes Absolute: 0.5 x10E3/uL (ref 0.1–0.9)
Monocytes: 7 %
Neutrophils Absolute: 3.9 x10E3/uL (ref 1.4–7.0)
Neutrophils: 48 %
Platelets: 449 x10E3/uL (ref 150–450)
RBC: 4.56 x10E6/uL (ref 3.77–5.28)
RDW: 12.6 % (ref 11.7–15.4)
WBC: 8 x10E3/uL (ref 3.4–10.8)

## 2024-06-26 LAB — BASIC METABOLIC PANEL WITH GFR
BUN/Creatinine Ratio: 22 (ref 9–23)
BUN: 16 mg/dL (ref 6–24)
CO2: 22 mmol/L (ref 20–29)
Calcium: 9.3 mg/dL (ref 8.7–10.2)
Chloride: 103 mmol/L (ref 96–106)
Creatinine, Ser: 0.72 mg/dL (ref 0.57–1.00)
Glucose: 101 mg/dL — ABNORMAL HIGH (ref 70–99)
Potassium: 4.4 mmol/L (ref 3.5–5.2)
Sodium: 141 mmol/L (ref 134–144)
eGFR: 99 mL/min/1.73 (ref >59)

## 2024-06-26 LAB — HEMOGLOBIN A1C
Est. average glucose Bld gHb Est-mCnc: 123 mg/dL
Hgb A1c MFr Bld: 5.9 % — ABNORMAL HIGH (ref 4.8–5.6)

## 2024-06-29 ENCOUNTER — Telehealth: Payer: Self-pay

## 2024-06-29 ENCOUNTER — Telehealth: Payer: Self-pay | Admitting: Physician Assistant

## 2024-06-29 ENCOUNTER — Other Ambulatory Visit: Payer: Self-pay | Admitting: Physician Assistant

## 2024-06-29 ENCOUNTER — Other Ambulatory Visit: Payer: Self-pay | Admitting: Obstetrics and Gynecology

## 2024-06-29 ENCOUNTER — Other Ambulatory Visit (HOSPITAL_COMMUNITY): Payer: Self-pay

## 2024-06-29 ENCOUNTER — Ambulatory Visit: Payer: Self-pay | Admitting: Physician Assistant

## 2024-06-29 DIAGNOSIS — E669 Obesity, unspecified: Secondary | ICD-10-CM

## 2024-06-29 DIAGNOSIS — Z1231 Encounter for screening mammogram for malignant neoplasm of breast: Secondary | ICD-10-CM

## 2024-06-29 MED ORDER — WEGOVY 0.25 MG/0.5ML ~~LOC~~ SOAJ
0.2500 mg | SUBCUTANEOUS | 0 refills | Status: DC
Start: 2024-06-29 — End: 2024-07-27

## 2024-06-29 NOTE — Telephone Encounter (Signed)
 Pharmacy Patient Advocate Encounter   Received notification from CoverMyMeds that prior authorization for Wegovy  0.25 is required/requested.   Insurance verification completed.   The patient is insured through [facility] .   Per test claim: PA required; PA submitted to above mentioned insurance via CoverMyMeds Key/confirmation #/EOC [id number] Status is pending

## 2024-06-29 NOTE — Telephone Encounter (Signed)
 Referral moved internally to    Referred To  [facility]  Nutrition  Letter sent to patients

## 2024-06-29 NOTE — Telephone Encounter (Signed)
 Copied from CRM [id number]. Topic: Referral - Question >> Jun 29, 2024  9:46 AM [place] J wrote: Reason for CRM: The patient stated that the nutritionist she was referred to does not accept her insurance and is needing a different referral sent in. She would like if there was an emphasis on her wanting a female nutritionist as her provider.

## 2024-06-30 ENCOUNTER — Other Ambulatory Visit (HOSPITAL_COMMUNITY): Payer: Self-pay

## 2024-06-30 NOTE — Telephone Encounter (Signed)
 Pharmacy Patient Advocate Encounter  Received notification from [employer] that Prior Authorization for Wegovy  0.25 has been APPROVED from 06/29/24 to 12/30/24. Unable to obtain price due to refill too soon rejection, last fill date 06/30/24 next available fill date8/13/25   PA #/Case ID/Reference #: [id number]

## 2024-07-05 ENCOUNTER — Encounter: Attending: Physician Assistant | Admitting: Skilled Nursing Facility1

## 2024-07-05 ENCOUNTER — Encounter: Payer: Self-pay | Admitting: Skilled Nursing Facility1

## 2024-07-05 DIAGNOSIS — K5732 Diverticulitis of large intestine without perforation or abscess without bleeding: Secondary | ICD-10-CM | POA: Diagnosis present

## 2024-07-05 NOTE — Progress Notes (Unsigned)
 Medical Nutrition Therapy  Appointment Start time:  9:00  Appointment End time:  10:00  Primary concerns today: weight loss and diverticulitis.   Referral diagnosis: r73.9   NUTRITION ASSESSMENT    Clinical Medical Hx: prediabetes  Medications: see list Labs: A1C 5.9 Notable Signs/Symptoms: tiredness for a few years   Lifestyle & Dietary Hx  Pt states she has limited variety in her diet due to fears of diverticulitis flares. Pt states she does not feel good stating she has been gaining weight. Pt states she avoids ultra processed foods and avoids eating out so she is at a loss for where the excess weight is coming from.  Pt states she feels tired all the time. Pt states she was working a job at [employer] hour days.  Pt states when she ended up in the hospital from sepsis in December and she lost her job. Pt states she is working for herself now.  Pt states she sometimes has brain fog.   Pt states she avoids red meat due to diverticulitis.   Estimated daily fluid intake: 60+ oz Supplements: none reported Sleep: reports no issues  Stress / self-care: pt states little, pt does have life Current average weekly physical activity: ADL's  24-Hr Dietary Recall First Meal: 2-3 eggs + fruit (no skin) Snack: muscle milk protein shake or protein powder and almond milk or water and fiber and oikos Second Meal: skipped or chicken Snack: protein bars or rest of protein shake Third Meal: diced tomatoes + mal[place] meat + spices  Snack: sometimes cashews  Beverages: water  NUTRITION INTERVENTION  Nutrition education (E-1) on the following topics:  Creation of balanced and diverse meals to increase the intake of nutrient-rich foods that provide essential vitamins, minerals, fiber, and phytonutrients Variety of Fruits and Vegetables: Aim for a colorful array of fruits and vegetables to ensure a wide range of nutrients. Include a mix of leafy greens, berries, citrus fruits, cruciferous vegetables,  and more. Whole Grains: Choose whole grains over refined grains. Examples include brown rice, quinoa, oats, whole wheat, and barley. Lean Proteins: Include lean sources of protein, such as poultry, fish, tofu, legumes, beans, lentils, and low-fat dairy products. Limit red and processed meats. Healthy Fats: Incorporate sources of healthy fats, including avocados, nuts, seeds, and olive oil. Limit saturated and trans fats found in fried and processed foods. Dairy or Dairy Alternatives: Choose low-fat or fat-free dairy products, or plant-based alternatives like almond or soy milk. Portion Control: Be mindful of portion sizes to avoid overeating. Pay attention to hunger and satisfaction cues. Limit Added Sugars: Minimize the consumption of sugary beverages, snacks, and desserts. Check food labels for added sugars and opt for natural sources of sweetness such as whole fruits. Hydration: Drink plenty of water throughout the day. Limit sugary drinks and excessive caffeine intake. Moderate Sodium Intake: Reduce the consumption of high-sodium foods. Use herbs and spices for flavor instead of excessive salt. Meal Planning and Preparation: Plan and prepare meals ahead of time to make healthier choices more convenient. Include a mix of food groups in each meal. Limit Processed Foods: Minimize the intake of highly processed and packaged foods that are often high in added sugars, salt, and unhealthy fats. Regular Physical Activity: Combine a healthy diet with regular physical activity for overall well-being. Aim for at least 150 minutes of moderate-intensity aerobic exercise per week, along with strength training. Moderation and Balance: Enjoy treats and indulgent foods in moderation, emphasizing balance rather than strict restriction.  Handouts Provided  Include  Detailed MyPlate  Learning Style & Readiness for Change Teaching method utilized: Visual & Auditory  Demonstrated degree of  understanding via: Teach Back  Barriers to learning/adherence to lifestyle change: preconceived diet notions   Goals Established by Pt I will stretch my plantar situation DAILY  I will add complex carbohydrates 3+ times daily  I will start with soft cooked non starchy vegetables working my way up to salads (you may not be able toe at salads daily but 3 days a week is a reasonable goal)   MONITORING & EVALUATION Dietary intake, weekly physical activity  Next Steps  Patient is to call or email with any future questions or concerns.

## 2024-07-19 ENCOUNTER — Ambulatory Visit
Admission: RE | Admit: 2024-07-19 | Discharge: 2024-07-19 | Disposition: A | Discharge status: Home / Self Care | Source: Ambulatory Visit | Attending: Obstetrics and Gynecology | Admitting: Obstetrics and Gynecology

## 2024-07-19 DIAGNOSIS — Z1231 Encounter for screening mammogram for malignant neoplasm of breast: Secondary | ICD-10-CM

## 2024-07-22 ENCOUNTER — Telehealth: Payer: Self-pay | Admitting: Physician Assistant

## 2024-07-22 ENCOUNTER — Other Ambulatory Visit: Payer: Self-pay | Admitting: Physician Assistant

## 2024-07-22 DIAGNOSIS — E669 Obesity, unspecified: Secondary | ICD-10-CM

## 2024-07-22 NOTE — Telephone Encounter (Signed)
 Patient states that today was her 4th injection and patient's PCP old her she would be on this for at least a year so she was trying to get her prescription refilled and it was denied.  Patient got a text from [facility] saying that her prescription was denied and she needed to contact her provider.           Copied from CRM [id number]. Topic: Clinical - Prescription Issue >> Jul 22, 2024 10:56 AM [name] wrote: Reason for CRM: Pt recently started the Semaglutide -Weight Management (WEGOVY ) 0.25 MG/0.5ML SOAJ. She is due for her next injection next Thursday but it was denied by office. She says she is trying to start the refill process early so she can have the medication on time as she is to take the medication the same day every week. Please advise

## 2024-07-22 NOTE — Telephone Encounter (Unsigned)
 Copied from CRM [id number]. Topic: Clinical - Prescription Issue >> Jul 22, 2024 10:56 AM [name] wrote: Reason for CRM: Pt recently started the Semaglutide -Weight Management (WEGOVY ) 0.25 MG/0.5ML SOAJ. She is due for her next injection next Thursday but it was denied by office. She says she is trying to start the refill process early so she can have the medication on time as she is to take the medication the same day every week. Please advise

## 2024-07-27 ENCOUNTER — Ambulatory Visit (INDEPENDENT_AMBULATORY_CARE_PROVIDER_SITE_OTHER): Admitting: Physician Assistant

## 2024-07-27 ENCOUNTER — Encounter: Payer: Self-pay | Admitting: Physician Assistant

## 2024-07-27 VITALS — BP 115/81 | HR 80 | Temp 98.4°F | Resp 12 | Ht 66.0 in | Wt 203.4 lb

## 2024-07-27 DIAGNOSIS — R599 Enlarged lymph nodes, unspecified: Secondary | ICD-10-CM

## 2024-07-27 DIAGNOSIS — J0100 Acute maxillary sinusitis, unspecified: Secondary | ICD-10-CM | POA: Diagnosis not present

## 2024-07-27 DIAGNOSIS — E669 Obesity, unspecified: Secondary | ICD-10-CM

## 2024-07-27 MED ORDER — WEGOVY 0.5 MG/0.5ML ~~LOC~~ SOAJ
0.5000 mg | SUBCUTANEOUS | 3 refills | Status: DC
Start: 2024-07-27 — End: 2024-09-16

## 2024-07-27 MED ORDER — AZELASTINE HCL 0.1 % NA SOLN: 1.0000 | Freq: Two times a day (BID) | NASAL | 0 refills | Status: AC

## 2024-07-27 MED ORDER — AMOXICILLIN 875 MG PO TABS
875.0000 mg | ORAL_TABLET | Freq: Two times a day (BID) | ORAL | 0 refills | Status: AC
Start: 2024-07-27 — End: 2024-08-03

## 2024-07-27 NOTE — Progress Notes (Signed)
 Established patient visit   Patient: Patricia Erickson   DOB: 01-18-1971   53 y.o. Female  MRN: [id number] Visit Date: 07/27/2024  Today's healthcare provider: Manuelita Flatness, PA-C   Cc. Sinus congestion,  Subjective    Pt reports nasal congestion, sinus pressure, fatigue x 1 week. Denies fever, reports negative home covid test. Taking tylenol  otc.  Reports a tender lump on the back of her neck x 2 + months, larger and more painful since getting sick. Was not there previous to a few months ago.   She reports tolerating Wegovy  well with minimal side effects. She has just finished her 4th 0.25 mg dose. She saw a nutritionist and is making better and more balanced dietary choices.  Medications: Outpatient Medications Prior to Visit  Medication Sig   Bacillus Coagulans-Inulin (PROBIOTIC-PREBIOTIC PO) Take 3.6 g by mouth daily.   Collagen-Vitamin C-Biotin (COLLAGEN PO) Take 1 capsule by mouth daily.   FIBER PO Take 1 Scoop by mouth See admin instructions. Mix 1 scoopful into 4-6 ounces of a desired beverage and drink by mouth once a day   gabapentin  (NEURONTIN ) 300 MG capsule Take 300 mg by mouth 2 (two) times daily.   levonorgestrel  (MIRENA , 52 MG,) 20 MCG/DAY IUD 1 each by Intrauterine route See admin instructions. Place 1 new device in the uterus every 8 years   Meth-Hyo-M Bl-Na Phos-Ph Sal (URO-MP) 118 MG CAPS Take 1 capsule by mouth 3 (three) times daily as needed (for bladder irritation).   [DISCONTINUED] Semaglutide -Weight Management (WEGOVY ) 0.25 MG/0.5ML SOAJ Inject 0.25 mg into the skin once a week.   [DISCONTINUED] amoxicillin -clavulanate (AUGMENTIN ) 875-125 MG tablet Take 1 tablet by mouth every 12 (twelve) hours. (Patient not taking: Reported on 06/25/2024)   [DISCONTINUED] dicyclomine  (BENTYL ) 20 MG tablet Take 1 tablet (20 mg total) by mouth 2 (two) times daily. (Patient not taking: Reported on 06/25/2024)   [DISCONTINUED] ondansetron  (ZOFRAN -ODT) 4 MG disintegrating  tablet Take 1 tablet (4 mg total) by mouth every 8 (eight) hours as needed. (Patient not taking: Reported on 06/25/2024)   No facility-administered medications prior to visit.    Review of Systems  Constitutional:  Negative for fatigue and fever.  HENT:  Positive for congestion.   Respiratory:  Negative for cough and shortness of breath.   Cardiovascular:  Negative for chest pain and leg swelling.  Gastrointestinal:  Negative for abdominal pain.  Neurological:  Positive for headaches. Negative for dizziness.       Objective    BP 115/81 (BP Location: Left Arm, Patient Position: Sitting, Cuff Size: Large)   Pulse 80   Temp 98.4 F (36.9 C) (Oral)   Resp 12   Ht 5' 6 (1.676 m)   Wt 203 lb 6.4 oz (92.3 kg)   SpO2 98%   BMI 32.83 kg/m    Physical Exam Constitutional:      General: She is awake.     Appearance: She is well-developed.  HENT:     Head: Normocephalic.     Right Ear: Tympanic membrane normal.     Left Ear: Tympanic membrane normal.     Mouth/Throat:     Pharynx: No oropharyngeal exudate or posterior oropharyngeal erythema.  Eyes:     Conjunctiva/sclera: Conjunctivae normal.  Neck:     Comments: Mid cervical spine, almost midline, there is a 2-3 cm slightly mobile, firm mass. Cardiovascular:     Rate and Rhythm: Normal rate and regular rhythm.     Heart sounds:  Normal heart sounds.  Pulmonary:     Effort: Pulmonary effort is normal.     Breath sounds: Normal breath sounds.  Skin:    General: Skin is warm.  Neurological:     Mental Status: She is alert and oriented to person, place, and time.  Psychiatric:        Attention and Perception: Attention normal.        Mood and Affect: Mood normal.        Speech: Speech normal.        Behavior: Behavior is cooperative.      No results found for any visits on 07/27/24.  Assessment & Plan    Adenopathy Likely a reactive node, but was present before illness, and is more midline than expected for a  cervical node.  Recommending US  to r/o adenopathy vs lipoma -     US  SOFT TISSUE HEAD & NECK (NON-THYROID )  Acute non-recurrent maxillary sinusitis Recommend antihistamines otc, hydrate, tylenol  Rx amox, azelastine  -     Amoxicillin ; Take 1 tablet (875 mg total) by mouth 2 (two) times daily for 7 days.  Dispense: 14 tablet; Refill: 0 -     Azelastine  HCl; Place 1 spray into both nostrils 2 (two) times daily. Use in each nostril as directed  Dispense: 30 mL; Refill: 0  Obesity (BMI 30-39.9) Tolerating well, staying consistent w/ lifestyle changes Increase to 0.5 mg weekly.  Body mass index is 32.83 kg/m. Wt Readings from Last 3 Encounters:  07/27/24 203 lb 6.4 oz (92.3 kg)  06/25/24 205 lb 12.8 oz (93.4 kg)  06/18/24 199 lb 4.7 oz (90.4 kg)    -     Wegovy ; Inject 0.5 mg into the skin once a week.  Dispense: 2 mL; Refill: 3    Return in about 3 months (around 10/27/2024) for weight Management.       Manuelita Flatness, PA-C  [facility] at [facility] [phone] (phone) [phone] (fax)  [facility]

## 2024-08-03 ENCOUNTER — Ambulatory Visit (INDEPENDENT_AMBULATORY_CARE_PROVIDER_SITE_OTHER)
Admission: RE | Admit: 2024-08-03 | Discharge: 2024-08-03 | Disposition: A | Discharge status: Home / Self Care | Source: Ambulatory Visit | Attending: Physician Assistant | Admitting: Physician Assistant

## 2024-08-03 DIAGNOSIS — R599 Enlarged lymph nodes, unspecified: Secondary | ICD-10-CM | POA: Diagnosis not present

## 2024-08-04 ENCOUNTER — Other Ambulatory Visit: Payer: Self-pay

## 2024-08-04 ENCOUNTER — Encounter: Payer: Self-pay | Admitting: Physical Therapy

## 2024-08-04 ENCOUNTER — Ambulatory Visit: Attending: Family Medicine | Admitting: Physical Therapy

## 2024-08-04 DIAGNOSIS — M6281 Muscle weakness (generalized): Secondary | ICD-10-CM | POA: Insufficient documentation

## 2024-08-04 DIAGNOSIS — R293 Abnormal posture: Secondary | ICD-10-CM | POA: Diagnosis present

## 2024-08-04 DIAGNOSIS — R279 Unspecified lack of coordination: Secondary | ICD-10-CM | POA: Diagnosis present

## 2024-08-04 NOTE — Patient Instructions (Signed)

## 2024-08-04 NOTE — Therapy (Signed)
 OUTPATIENT PHYSICAL THERAPY FEMALE PELVIC EVALUATION   Patient Name: Patricia Erickson MRN: [id number] DOB:October 01, 1971, 53 y.o., female Today's Date: 08/04/2024  END OF SESSION:  PT End of Session - 08/04/24 1020     Visit Number 1    Date for PT Re-Evaluation 02/04/25    Authorization Type [place] MEDICAID [employer]    PT Start Time 1016    PT Stop Time 1048    PT Time Calculation (min) 32 min    Activity Tolerance Patient tolerated treatment well    Behavior During Therapy WFL for tasks assessed/performed          Past Medical History:  Diagnosis Date   COVID    History of kidney stones    Hx of sepsis    Migraines    hz of migraines    Polyp of colon 03/11/2007   BENIGN   Past Surgical History:  Procedure Laterality Date   CESAREAN SECTION  12/09/1998   CHOLECYSTECTOMY N/A 10/18/2015   Procedure: LAPAROSCOPIC CHOLECYSTECTOMY;  Surgeon: Herlene Beverley Bureau, MD;  Location: [facility];  Service: General;  Laterality: N/A;   CYSTOSCOPY WITH STENT PLACEMENT Left 10/23/2023   Procedure: CYSTOSCOPY WITH RETROGRADE PYELOGRAM STENT PLACEMENT;  Surgeon: Shane Steffan BROCKS, MD;  Location: [facility];  Service: Urology;  Laterality: Left;   CYSTOSCOPY/URETEROSCOPY/HOLMIUM LASER/STENT PLACEMENT Left 07/31/2020   Procedure: CYSTOSCOPY/URETEROSCOPY/HOLMIUM LASER/STENT PLACEMENT;  Surgeon: Elisabeth Valli BIRCH, MD;  Location: [facility];  Service: Urology;  Laterality: Left;  90 MINS   CYSTOSCOPY/URETEROSCOPY/HOLMIUM LASER/STENT PLACEMENT Left 11/13/2023   Procedure: CYSTOSCOPY, LEFT URETEROSCOPY, RETROGRADE PYELOGRAM; HOLMIUM LASER LITHOTRIPSY, AND LEFT URETERAL STENT EXCHANGE;  Surgeon: Shane Steffan BROCKS, MD;  Location: [facility];  Service: Urology;  Laterality: Left;  45 MINUTES   LAPAROSCOPIC CHOLECYSTECTOMY  10/18/2015   Patient Active Problem List   Diagnosis Date Noted   Diverticulitis of colon 02/04/2024   Chronic abdominal pain 12/23/2023   Chronic pelvic  pain in female 12/23/2023   Aortic atherosclerosis (HCC) 10/23/2023   History of migraine headaches 10/23/2023   Lumbar radiculopathy 02/21/2022   Cervical radiculopathy 02/21/2022   Obesity (BMI 30-39.9) 03/03/2017   Intermittent palpitations 07/05/2015   Family history of colon cancer 10/12/2012   Family history of breast cancer in female 10/12/2012   Migraine headache 01/19/2009   Hemorrhoids 01/10/2009   History of colonic polyps 01/10/2009    PCP: Cyndi Shaver, PA-C   REFERRING PROVIDER: McDiarmid, Krystal BIRCH, MD  REFERRING DIAG: N30.21 (ICD-10-CM) - Other chronic cystitis with hematuria N20.0 (ICD-10-CM) - Calculus of kidney N39.3 (ICD-10-CM) - Stress incontinence (female) (female) R39.14 (ICD-10-CM) - Feeling of incomplete bladder emptying  THERAPY DIAG:  Muscle weakness (generalized)  Unspecified lack of coordination  Abnormal posture  Rationale for Evaluation and Treatment: Rehabilitation  ONSET DATE: several years  SUBJECTIVE:  SUBJECTIVE STATEMENT: Urinary incontinence with post void leakage, sneezing/laughing/coughing, doesn't wear pads but moderate leakage. Also has increased urinary frequency   Fluid intake: water - (only liquid) several glasses all day  PAIN:  Are you having pain? Yes NPRS scale: 3/10 Pain location: low back  Pain type: aching Pain description: intermittent   Aggravating factors: random Relieving factors: rest and stretches   PRECAUTIONS: None  RED FLAGS: None   WEIGHT BEARING RESTRICTIONS: No  FALLS:  Has patient fallen in last 6 months? No  OCCUPATION: remote  ACTIVITY LEVEL : low  PLOF: Independent  PATIENT GOALS: to have less leakage  PERTINENT HISTORY:  Diverticulitis of colon, CESAREAN SECTION, CHOLECYSTECTOMY, Hemorrhoids,  Lumbar radiculopathy, Chronic pelvic pain in female Sexual abuse: No  BOWEL MOVEMENT: Pain with bowel movement: No Type of bowel movement:Type (Bristol Stool Scale) 4, Frequency daily, and Strain no Fully empty rectum: Yes:   Leakage: No Pads: No Fiber supplement/laxative No  URINATION: Pain with urination: No Fully empty bladder: Yes: but will have leakage after  Stream: Strong Urgency: No Frequency: nightly 4-6x; at least 1x hourly  Leakage: Urge to void, Coughing, Sneezing, Laughing, Exercise, and Lifting Pads: No  INTERCOURSE:  Ability to have vaginal penetration Yes  Pain with intercourse: none DrynessNo   PREGNANCY: Vaginal deliveries   C-section deliveries 1 Currently pregnant No  PROLAPSE: None   OBJECTIVE:  Note: Objective measures were completed at Evaluation unless otherwise noted.  DIAGNOSTIC FINDINGS:    PATIENT SURVEYS:    PFIQ-7: 75  COGNITION: Overall cognitive status: Within functional limits for tasks assessed     SENSATION: Light touch: Appears intact  LUMBAR SPECIAL TESTS:  Single leg stance test: 6sLT and 8s Rt mild pelvic rotation  FUNCTIONAL TESTS:  Sit up test 1/3  GAIT: WFL  POSTURE: forward head and posterior pelvic tilt   LUMBARAROM/PROM:  A/PROM A/PROM  eval  Flexion Limited by 50% but no pain  Extension WFL  Right lateral flexion WFL  Left lateral flexion WFL  Right rotation Limited by 25%  Left rotation Limited by 25%   (Blank rows = not tested)  LOWER EXTREMITY ROM:  Bil hamstrings and adductors limited by 25%  LOWER EXTREMITY MMT:  Bil hips grossly 4/5 PALPATION:   General: tightness throughout bil lumbar paraspinals   Pelvic Alignment: WFL  Abdominal: mild TTP LLQ tension throughout Lt                External Perineal Exam: mild dryness noted, denied irritation                              Internal Pelvic Floor: no TTP  Patient confirms identification and approves PT to assess internal  pelvic floor and treatment Yes No emotional/communication barriers or cognitive limitation. Patient is motivated to learn. Patient understands and agrees with treatment goals and plan. PT explains patient will be examined in standing, sitting, and lying down to see how their muscles and joints work. When they are ready, they will be asked to remove their underwear so PT can examine their perineum. The patient is also given the option of providing their own chaperone as one is not provided in our facility. The patient also has the right and is explained the right to defer or refuse any part of the evaluation or treatment including the internal exam. With the patient's consent, PT will use one gloved finger to gently assess the muscles of  the pelvic floor, seeing how well it contracts and relaxes and if there is muscle symmetry. After, the patient will get dressed and PT and patient will discuss exam findings and plan of care. PT and patient discuss plan of care, schedule, attendance policy and HEP activities.  PELVIC MMT:   MMT eval  Vaginal 3/5, 4s, 4 reps  Internal Anal Sphincter   External Anal Sphincter   Puborectalis   Diastasis Recti   (Blank rows = not tested)        TONE: WFL  PROLAPSE: Not seen in hooklying ant/post with cough   TODAY'S TREATMENT:                                                                                                                              DATE:   08/04/24 EVAL Examination completed, findings reviewed, pt educated on POC, HEP, and urge drill . Pt motivated to participate in PT and agreeable to attempt recommendations.     PATIENT EDUCATION:  Education details: [id number], urge Person educated: Patient Education method: Explanation, Demonstration, Tactile cues, Verbal cues, and Handouts Education comprehension: verbalized understanding, returned demonstration, verbal cues required, tactile cues required, and needs further education  HOME EXERCISE  PROGRAM: [id number]  ASSESSMENT:  CLINICAL IMPRESSION: Patient is a 53 y.o. female  who was seen today for physical therapy evaluation and treatment for increased urinary frequency, urinary incontinence. Pt reports this has been happening at least 2 years now with all sneezing/laughing/coughing and with exercise and heavy lifting, sometimes urge as well but this is more rare. Pt found to have decreased core and hip strength, decreased flexibility at spine and hips, decreased mobility at abdominal fascia. Patient consented to internal pelvic floor assessment vaginally this date and found to have decreased strength, endurance, and coordination. Patient benefited from verbal cues for improved technique with pelvic floor contractions and breathing. Pt would benefit from additional PT to further address deficits.    OBJECTIVE IMPAIRMENTS: decreased activity tolerance, decreased coordination, decreased endurance, decreased mobility, decreased strength, increased fascial restrictions, impaired flexibility, improper body mechanics, and postural dysfunction.   ACTIVITY LIMITATIONS: continence  PARTICIPATION LIMITATIONS: community activity  PERSONAL FACTORS: Time since onset of injury/illness/exacerbation are also affecting patient's functional outcome.   REHAB POTENTIAL: Good  CLINICAL DECISION MAKING: Stable/uncomplicated  EVALUATION COMPLEXITY: Low   GOALS: Goals reviewed with patient? Yes  SHORT TERM GOALS: Target date: 09/01/24  Pt to be I with HEP for carry over and continuing recommendations for improved outcomes.   Baseline:new  Goal status: INITIAL  2.  Pt will be independent with the knack, urge suppression technique, and double voiding in order to improve bladder habits and decrease urinary incontinence.   Baseline:  Goal status: INITIAL  3.  Pt to report ability to decreased urinary frequency to at least 1 hour most consistently for improved ability to complete a work meeting  without having to use restroom.  Baseline:  Goal status: INITIAL  LONG TERM GOALS: Target date: 02/04/25  Pt to be I with advanced  HEP for carry over and continuing recommendations for improved outcomes.   Baseline:  Goal status: INITIAL  2.  Pt to report improved time between bladder voids to at least 2 hours for improved QOL with decreased urinary frequency for ability to go to dinner with friends without having to use restroom several times.   Baseline:  Goal status: INITIAL  3.  Pt to report no urinary incontinence for at least one 24 hour period for improved QOL, skin integrity, and confidence with community outings.  Baseline:  Goal status: INITIAL  4.  Pt to report no more than 2 urinary voids per night for improved sleep quality.  Baseline:  Goal status: INITIAL  5.  Pt to demonstrate improved coordination of pelvic floor and breathing mechanics with 15# squat with appropriate synergistic patterns to decrease pain and leakage at least 75% of the time for improved ability to complete a 30 minute walk without strain at pelvic floor and symptoms.    Baseline:  Goal status: INITIAL   PLAN:  PT FREQUENCY: 1x/week  PT DURATION: 10 sessions  PLANNED INTERVENTIONS: 97110-Therapeutic exercises, 97530- Therapeutic activity, 97112- Neuromuscular re-education, 97535- Self Care, 02859- Manual therapy, [phone]- Aquatic Therapy, [phone] (1-2 muscles), [phone] (3+ muscles)- Dry Needling, Patient/Family education, Taping, Joint mobilization, Spinal mobilization, Scar mobilization, DME instructions, Cryotherapy, Moist heat, and Biofeedback  PLAN FOR NEXT SESSION: core and hip strengthening, coordination of pelvic floor with breathing and exercise, pelvic floor strengthening, knack, urge drill review if needed    Darryle Navy, PT, DPT 08/04/2510:00 AM  [facility] [address] [place], [place] [address] Phone # [phone] Fax [phone]

## 2024-09-03 ENCOUNTER — Telehealth: Payer: Self-pay | Admitting: Physician Assistant

## 2024-09-03 NOTE — Telephone Encounter (Signed)
 Copied from CRM [id number]. Topic: Clinical - Medication Prior Auth >> Sep 03, 2024  4:08 PM Lauren C wrote: Reason for CRM: Pt calling to let us  know that semaglutide -weight management (WEGOVY ) 0.5 MG/0.5ML SOAJ SQ injection will no longer be covered by insurance after 10/1. Med now needs prior auth. Please let pt know once complete at [phone]

## 2024-09-06 ENCOUNTER — Ambulatory Visit: Admitting: Skilled Nursing Facility1

## 2024-09-06 NOTE — Telephone Encounter (Signed)
 Per patient has letter that says medicaid will consider coverage until 10/05 due to pre-existing conditions. Her appointment has been moved to 10/02 with Dr. Antonio

## 2024-09-08 ENCOUNTER — Other Ambulatory Visit: Payer: Self-pay | Admitting: Obstetrics and Gynecology

## 2024-09-08 DIAGNOSIS — N6311 Unspecified lump in the right breast, upper outer quadrant: Secondary | ICD-10-CM

## 2024-09-09 ENCOUNTER — Ambulatory Visit (INDEPENDENT_AMBULATORY_CARE_PROVIDER_SITE_OTHER): Admitting: Family Medicine

## 2024-09-09 ENCOUNTER — Encounter: Payer: Self-pay | Admitting: Family Medicine

## 2024-09-09 VITALS — BP 110/70 | HR 75 | Temp 98.9°F | Resp 18 | Ht 66.0 in | Wt 199.0 lb

## 2024-09-09 DIAGNOSIS — E669 Obesity, unspecified: Secondary | ICD-10-CM | POA: Diagnosis not present

## 2024-09-09 DIAGNOSIS — I700 Atherosclerosis of aorta: Secondary | ICD-10-CM | POA: Diagnosis not present

## 2024-09-09 DIAGNOSIS — R739 Hyperglycemia, unspecified: Secondary | ICD-10-CM

## 2024-09-09 LAB — CBC WITH DIFFERENTIAL/PLATELET
Basophils Absolute: 0 K/uL (ref 0.0–0.1)
Basophils Relative: 0.5 % (ref 0.0–3.0)
Eosinophils Absolute: 0.3 K/uL (ref 0.0–0.7)
Eosinophils Relative: 3.2 % (ref 0.0–5.0)
HCT: 40.8 % (ref 36.0–46.0)
Hemoglobin: 13.5 g/dL (ref 12.0–15.0)
Lymphocytes Relative: 27.1 % (ref 12.0–46.0)
Lymphs Abs: 2.4 K/uL (ref 0.7–4.0)
MCHC: 33 g/dL (ref 30.0–36.0)
MCV: 87.4 fl (ref 78.0–100.0)
Monocytes Absolute: 0.7 K/uL (ref 0.1–1.0)
Monocytes Relative: 7.7 % (ref 3.0–12.0)
Neutro Abs: 5.5 K/uL (ref 1.4–7.7)
Neutrophils Relative %: 61.5 % (ref 43.0–77.0)
Platelets: 325 K/uL (ref 150.0–400.0)
RBC: 4.66 Mil/uL (ref 3.87–5.11)
RDW: 14.4 % (ref 11.5–15.5)
WBC: 9 K/uL (ref 4.0–10.5)

## 2024-09-09 LAB — COMPREHENSIVE METABOLIC PANEL WITH GFR
ALT: 24 U/L (ref 0–35)
AST: 16 U/L (ref 0–37)
Albumin: 4.4 g/dL (ref 3.5–5.2)
Alkaline Phosphatase: 78 U/L (ref 39–117)
BUN: 21 mg/dL (ref 6–23)
CO2: 30 meq/L (ref 19–32)
Calcium: 9.6 mg/dL (ref 8.4–10.5)
Chloride: 105 meq/L (ref 96–112)
Creatinine, Ser: 0.72 mg/dL (ref 0.40–1.20)
GFR: 93.99 mL/min (ref >60.00)
Glucose, Bld: 80 mg/dL (ref 70–99)
Potassium: 3.9 meq/L (ref 3.5–5.1)
Sodium: 144 meq/L (ref 135–145)
Total Bilirubin: 0.4 mg/dL (ref 0.2–1.2)
Total Protein: 6.8 g/dL (ref 6.0–8.3)

## 2024-09-09 LAB — LIPID PANEL
Cholesterol: 178 mg/dL (ref 0–200)
HDL: 47 mg/dL (ref >39.00)
LDL Cholesterol: 112 mg/dL — ABNORMAL HIGH (ref 0–99)
NonHDL: 130.5
Total CHOL/HDL Ratio: 4
Triglycerides: 93 mg/dL (ref 0.0–149.0)
VLDL: 18.6 mg/dL (ref 0.0–40.0)

## 2024-09-09 LAB — TSH: TSH: 0.72 u[IU]/mL (ref 0.35–5.50)

## 2024-09-09 LAB — VITAMIN D 25 HYDROXY (VIT D DEFICIENCY, FRACTURES): VITD: 47.49 ng/mL (ref 30.00–100.00)

## 2024-09-09 LAB — VITAMIN B12: Vitamin B-12: 812 pg/mL (ref 211–911)

## 2024-09-09 NOTE — Progress Notes (Signed)
 Subjective:    Patient ID: Patricia Erickson, female    DOB: Jan 08, 1971, 53 y.o.   MRN: [id number]  Chief Complaint  Patient presents with   Weight Check    HPI Patient is in today for weight check.  Discussed the use of AI scribe software for clinical note transcription with the patient, who gave verbal consent to proceed.  History of Present Illness Patricia Erickson is a 53 year old female who presents for a follow-up on weight management.  She has been using Wegovy  for weight management but received a letter indicating her insurance will no longer cover it as of October 1st. She has lost approximately 14 pounds according to her home scale, though the clinic's scale shows a 6-pound loss, which she attributes to wearing jeans. She was on a low dose of the medication and has one dose remaining.  She denies knowing if she snores, as she sleeps alone with her dog, but occasionally receives notifications from her watch about snoring. She felt very sleepy this morning, describing it as feeling 'almost like I've been drugged.' She does not feel rested upon waking and sometimes wakes up to urinate between three to six times a night, despite reducing water intake before bed.  Her current medications include a low dose of gabapentin  for a nerve issue in her leg, which she is weaning off, and Uribel as needed, though she hasn't used it in over a month. She also takes allergy medication.  She has a history of diverticulitis, which has required hospitalization. She avoids red meat, processed meats, and salads due to exacerbation of symptoms. She follows a diet primarily consisting of fruits, meats, and vegetables, with occasional whole grain bread. She reports a past high blood sugar reading and has consulted a nutritionist, who advised her to increase dietary variety.  She is self-employed, Environmental health practitioner, including vintage Personnel officer.    Past Medical History:  Diagnosis Date    COVID    History of kidney stones    Hx of sepsis    Migraines    hz of migraines    Polyp of colon 03/11/2007   BENIGN    Past Surgical History:  Procedure Laterality Date   CESAREAN SECTION  12/09/1998   CHOLECYSTECTOMY N/A 10/18/2015   Procedure: LAPAROSCOPIC CHOLECYSTECTOMY;  Surgeon: Herlene Beverley Bureau, MD;  Location: [facility];  Service: General;  Laterality: N/A;   CYSTOSCOPY WITH STENT PLACEMENT Left 10/23/2023   Procedure: CYSTOSCOPY WITH RETROGRADE PYELOGRAM STENT PLACEMENT;  Surgeon: Shane Steffan BROCKS, MD;  Location: [facility];  Service: Urology;  Laterality: Left;   CYSTOSCOPY/URETEROSCOPY/HOLMIUM LASER/STENT PLACEMENT Left 07/31/2020   Procedure: CYSTOSCOPY/URETEROSCOPY/HOLMIUM LASER/STENT PLACEMENT;  Surgeon: Elisabeth Valli BIRCH, MD;  Location: [facility];  Service: Urology;  Laterality: Left;  90 MINS   CYSTOSCOPY/URETEROSCOPY/HOLMIUM LASER/STENT PLACEMENT Left 11/13/2023   Procedure: CYSTOSCOPY, LEFT URETEROSCOPY, RETROGRADE PYELOGRAM; HOLMIUM LASER LITHOTRIPSY, AND LEFT URETERAL STENT EXCHANGE;  Surgeon: Shane Steffan BROCKS, MD;  Location: [facility];  Service: Urology;  Laterality: Left;  45 MINUTES   LAPAROSCOPIC CHOLECYSTECTOMY  10/18/2015    Family History  Problem Relation Age of Onset   Osteoporosis Mother    Breast cancer Mother 72   Colon cancer Mother 30       removed large intestine   Lymphoma Mother 20   Hypertension Father    Osteoporosis Maternal Grandmother    Lymphoma Maternal Grandmother 71   Colon polyps Sister  several dx. 60 or younger   Other Sister        hysterectomy at 53; suspicious breast finding removed at 68   Colon cancer Maternal Aunt 43   Lymphoma Maternal Uncle 63   Lung cancer Maternal Grandfather 66       metastatic to bone; former smoker   Throat cancer Paternal Grandmother 34       not a smoker   Heart Problems Paternal Grandfather    Other Other        suspicious breast finding at 72 - recent follow-up  normal   Heart Problems Maternal Uncle    Bladder Cancer Paternal Uncle 51   Throat cancer Paternal Uncle        dx. before age 52    Social History   Socioeconomic History   Marital status: Divorced    Spouse name: Not on file   Number of children: Not on file   Years of education: 14   Highest education level: Some college, no degree  Occupational History   Not on file  Tobacco Use   Smoking status: Former    Current packs/day: 0.00    Average packs/day: 0.5 packs/day for 7.0 years (3.5 ttl pk-yrs)    Types: Cigarettes    Start date: 10/12/1985    Quit date: 10/12/1992    Years since quitting: 31.9   Smokeless tobacco: Never  Vaping Use   Vaping status: Never Used  Substance and Sexual Activity   Alcohol use: Not Currently   Drug use: No   Sexual activity: Not Currently    Birth control/protection: I.U.D.    Comment: Mirena  10/29/2012  Other Topics Concern   Not on file  Social History Narrative   Not on file   Social Drivers of Health   Financial Resource Strain: High Risk (06/24/2024)   Overall Financial Resource Strain (CARDIA)    Difficulty of Paying Living Expenses: Hard  Food Insecurity: No Food Insecurity (06/24/2024)   Hunger Vital Sign    Worried About Running Out of Food in the Last Year: Never true    Ran Out of Food in the Last Year: Never true  Transportation Needs: No Transportation Needs (06/24/2024)   PRAPARE - Administrator, Civil Service (Medical): No    Lack of Transportation (Non-Medical): No  Physical Activity: Inactive (06/24/2024)   Exercise Vital Sign    Days of Exercise per Week: 0 days    Minutes of Exercise per Session: Not on file  Stress: No Stress Concern Present (06/24/2024)   [employer] of Occupational Health - Occupational Stress Questionnaire    Feeling of Stress: Not at all  Social Connections: Moderately Integrated (06/24/2024)   Social Connection and Isolation Panel    Frequency of Communication with  Friends and Family: More than three times a week    Frequency of Social Gatherings with Friends and Family: Three times a week    Attends Religious Services: More than 4 times per year    Active Member of Clubs or Organizations: Yes    Attends Banker Meetings: 1 to 4 times per year    Marital Status: Divorced  Intimate Partner Violence: Not At Risk (02/04/2024)   Humiliation, Afraid, Rape, and Kick questionnaire    Fear of Current or Ex-Partner: No    Emotionally Abused: No    Physically Abused: No    Sexually Abused: No    Outpatient Medications Prior to Visit  Medication Sig Dispense Refill  azelastine  (ASTELIN ) 0.1 % nasal spray Place 1 spray into both nostrils 2 (two) times daily. Use in each nostril as directed 30 mL 0   Bacillus Coagulans-Inulin (PROBIOTIC-PREBIOTIC PO) Take 3.6 g by mouth daily.     Collagen-Vitamin C-Biotin (COLLAGEN PO) Take 1 capsule by mouth daily.     FIBER PO Take 1 Scoop by mouth See admin instructions. Mix 1 scoopful into 4-6 ounces of a desired beverage and drink by mouth once a day     gabapentin  (NEURONTIN ) 300 MG capsule Take 300 mg by mouth 2 (two) times daily.     levonorgestrel  (MIRENA , 52 MG,) 20 MCG/DAY IUD 1 each by Intrauterine route See admin instructions. Place 1 new device in the uterus every 8 years     Meth-Hyo-M Bl-Na Phos-Ph Sal (URO-MP) 118 MG CAPS Take 1 capsule by mouth 3 (three) times daily as needed (for bladder irritation).     semaglutide -weight management (WEGOVY ) 0.5 MG/0.5ML SOAJ SQ injection Inject 0.5 mg into the skin once a week. 2 mL 3   No facility-administered medications prior to visit.    Allergies  Allergen Reactions   Other Other (See Comments)    Darvocet.   Propoxyphene N-Acetaminophen  Nausea And Vomiting    Review of Systems  Constitutional:  Negative for fever and malaise/fatigue.  HENT:  Negative for congestion.   Eyes:  Negative for blurred vision.  Respiratory:  Negative for cough and  shortness of breath.   Cardiovascular:  Negative for chest pain, palpitations and leg swelling.  Gastrointestinal:  Negative for abdominal pain, blood in stool, nausea and vomiting.  Genitourinary:  Negative for dysuria and frequency.  Musculoskeletal:  Negative for back pain and falls.  Skin:  Negative for rash.  Neurological:  Negative for dizziness, loss of consciousness and headaches.  Endo/Heme/Allergies:  Negative for environmental allergies.  Psychiatric/Behavioral:  Negative for depression. The patient is not nervous/anxious.        Objective:    Physical Exam  BP 110/70 (BP Location: Left Arm, Patient Position: Sitting, Cuff Size: Large)   Pulse 75   Temp 98.9 F (37.2 C) (Oral)   Resp 18   Ht 5' 6 (1.676 m)   Wt 199 lb (90.3 kg)   SpO2 98%   BMI 32.12 kg/m  Wt Readings from Last 3 Encounters:  09/09/24 199 lb (90.3 kg)  07/27/24 203 lb 6.4 oz (92.3 kg)  06/25/24 205 lb 12.8 oz (93.4 kg)    Diabetic Foot Exam - Simple   No data filed    Lab Results  Component Value Date   WBC 8.0 06/25/2024   HGB 13.0 06/25/2024   HCT 41.1 06/25/2024   PLT 449 06/25/2024   GLUCOSE 101 (H) 06/25/2024   CHOL 186 02/04/2024   TRIG 66 02/04/2024   HDL 60 02/04/2024   LDLCALC 114 (H) 02/04/2024   ALT 30 06/18/2024   AST 17 06/18/2024   NA 141 06/25/2024   K 4.4 06/25/2024   CL 103 06/25/2024   CREATININE 0.72 06/25/2024   BUN 16 06/25/2024   CO2 22 06/25/2024   TSH 1.370 02/04/2024   HGBA1C 5.9 (H) 06/25/2024    Lab Results  Component Value Date   TSH 1.370 02/04/2024   Lab Results  Component Value Date   WBC 8.0 06/25/2024   HGB 13.0 06/25/2024   HCT 41.1 06/25/2024   MCV 90 06/25/2024   PLT 449 06/25/2024   Lab Results  Component Value Date   NA 141  06/25/2024   K 4.4 06/25/2024   CO2 22 06/25/2024   GLUCOSE 101 (H) 06/25/2024   BUN 16 06/25/2024   CREATININE 0.72 06/25/2024   BILITOT 0.5 06/18/2024   ALKPHOS 72 06/18/2024   AST 17 06/18/2024    ALT 30 06/18/2024   PROT 7.1 06/18/2024   ALBUMIN 3.5 06/18/2024   CALCIUM 9.3 06/25/2024   ANIONGAP 10 06/18/2024   EGFR 99 06/25/2024   Lab Results  Component Value Date   CHOL 186 02/04/2024   Lab Results  Component Value Date   HDL 60 02/04/2024   Lab Results  Component Value Date   LDLCALC 114 (H) 02/04/2024   Lab Results  Component Value Date   TRIG 66 02/04/2024   Lab Results  Component Value Date   CHOLHDL 3.1 02/04/2024   Lab Results  Component Value Date   HGBA1C 5.9 (H) 06/25/2024       Assessment & Plan:  Hyperglycemia -     Comprehensive metabolic panel with GFR  Aortic atherosclerosis -     CBC with Differential/Platelet -     Comprehensive metabolic panel with GFR -     Lipid panel  Obesity (BMI 30-39.9) -     CBC with Differential/Platelet -     Comprehensive metabolic panel with GFR -     Lipid panel -     TSH -     VITAMIN D  25 Hydroxy (Vit-D Deficiency, Fractures) -     Vitamin B12 -     Insulin, random  Assessment and Plan Assessment & Plan Obesity   Obesity management is complicated by insurance coverage issues for Wegovy . She has lost approximately 14 pounds at home but only 6 pounds at the clinic, likely due to clothing differences. Weight loss is slow, which is acceptable given the low medication dose. Insurance will no longer cover Wegovy  as of October 1st, and she cannot afford the out-of-pocket cost. Alternative medications like Zepbound are also costly. Discussed potential for insurance coverage if sleep apnea is diagnosed. Consider a sleep study if nocturia does not improve with reduced water intake before bed. Discussed stretching out the last dose of Wegovy  to maximize its effect.  Nocturia   She experiences frequent nocturia, waking 3-6 times per night to urinate. Despite reducing water intake before bed, frequent urination persists. Discussed the possibility of sleep apnea contributing to nocturia due to hormone release during  apneic episodes. Advise stopping water intake 2 hours before bed to see if nocturia improves. Consider a sleep study if nocturia persists despite reduced water intake.  Elevated blood glucose   Slightly elevated blood glucose was noted in July labs. She follows a restricted diet, avoiding breads, potatoes, and processed foods. A nutritionist advised increasing dietary variety, including whole grains. She is cautious with diet due to diverticulitis. Recheck labs to monitor blood glucose levels.  Diverticulitis   She maintains dietary restrictions to prevent flare-ups, avoiding red meat, processed meats, and certain vegetables like lettuce that exacerbate symptoms.    Suheyb Raucci R Lowne Chase, DO

## 2024-09-10 LAB — INSULIN, RANDOM: Insulin: 19.7 u[IU]/mL — ABNORMAL HIGH

## 2024-09-13 ENCOUNTER — Ambulatory Visit: Admitting: Physical Therapy

## 2024-09-14 ENCOUNTER — Ambulatory Visit
Admission: RE | Admit: 2024-09-14 | Discharge: 2024-09-14 | Disposition: A | Source: Ambulatory Visit | Attending: Obstetrics and Gynecology | Admitting: Obstetrics and Gynecology

## 2024-09-14 ENCOUNTER — Ambulatory Visit
Admission: RE | Admit: 2024-09-14 | Discharge: 2024-09-14 | Disposition: A | Discharge status: Home / Self Care | Source: Ambulatory Visit | Attending: Obstetrics and Gynecology | Admitting: Obstetrics and Gynecology

## 2024-09-14 DIAGNOSIS — N6311 Unspecified lump in the right breast, upper outer quadrant: Secondary | ICD-10-CM

## 2024-09-15 ENCOUNTER — Encounter: Payer: Self-pay | Admitting: Family Medicine

## 2024-09-16 ENCOUNTER — Other Ambulatory Visit: Payer: Self-pay | Admitting: Family Medicine

## 2024-09-16 ENCOUNTER — Ambulatory Visit: Payer: Self-pay | Admitting: Family Medicine

## 2024-09-16 ENCOUNTER — Other Ambulatory Visit (HOSPITAL_COMMUNITY): Payer: Self-pay

## 2024-09-16 DIAGNOSIS — E88819 Insulin resistance, unspecified: Secondary | ICD-10-CM

## 2024-09-16 MED ORDER — TIRZEPATIDE-WEIGHT MANAGEMENT 2.5 MG/0.5ML ~~LOC~~ SOLN: 2.5000 mg | SUBCUTANEOUS | 0 refills | Status: DC

## 2024-09-16 MED ORDER — ZEPBOUND 2.5 MG/0.5ML ~~LOC~~ SOAJ: 2.5000 mg | SUBCUTANEOUS | 0 refills | Status: DC

## 2024-09-16 NOTE — Telephone Encounter (Signed)
Noted. See lab notes.  

## 2024-09-21 ENCOUNTER — Ambulatory Visit: Admitting: Family Medicine

## 2024-09-22 ENCOUNTER — Encounter: Admitting: Physical Therapy

## 2024-09-29 ENCOUNTER — Encounter: Admitting: Physical Therapy

## 2024-10-06 ENCOUNTER — Encounter: Admitting: Physical Therapy

## 2024-10-13 ENCOUNTER — Encounter: Admitting: Physical Therapy

## 2024-10-20 ENCOUNTER — Encounter: Admitting: Physical Therapy

## 2024-10-26 ENCOUNTER — Ambulatory Visit (INDEPENDENT_AMBULATORY_CARE_PROVIDER_SITE_OTHER): Admitting: Family Medicine

## 2024-10-26 ENCOUNTER — Encounter: Payer: Self-pay | Admitting: Family Medicine

## 2024-10-26 VITALS — BP 118/76 | HR 74 | Temp 99.0°F | Resp 12 | Ht 66.0 in | Wt 206.4 lb

## 2024-10-26 DIAGNOSIS — R609 Edema, unspecified: Secondary | ICD-10-CM

## 2024-10-26 DIAGNOSIS — M79661 Pain in right lower leg: Secondary | ICD-10-CM

## 2024-10-26 MED ORDER — HYDROCHLOROTHIAZIDE 25 MG PO TABS: 25.0000 mg | ORAL_TABLET | Freq: Every day | ORAL | 3 refills | Status: AC

## 2024-10-26 NOTE — Patient Instructions (Signed)
Edema  Edema is an abnormal buildup of fluids in the body tissues and under the skin. Swelling of the legs, feet, and ankles is a common symptom that becomes more likely as you get older. Swelling is also common in looser tissues, such as around the eyes. Pressing on the area may make a temporary dent in your skin (pitting edema). This fluid may also accumulate in your lungs (pulmonary edema). There are many possible causes of edema. Eating too much salt (sodium) and being on your feet or sitting for a long time can cause edema in your legs, feet, and ankles. Common causes of edema include: Certain medical conditions, such as heart failure, liver or kidney disease, and cancer. Weak leg blood vessels. An injury. Pregnancy. Medicines. Being obese. Low protein levels in the blood. Hot weather may make edema worse. Edema is usually painless. Your skin may look swollen or shiny. Follow these instructions at home: Medicines Take over-the-counter and prescription medicines only as told by your health care provider. Your health care provider may prescribe a medicine to help your body get rid of extra water (diuretic). Take this medicine if you are told to take it. Eating and drinking Eat a low-salt (low-sodium) diet to reduce fluid as told by your health care provider. Sometimes, eating less salt may reduce swelling. Depending on the cause of your swelling, you may need to limit how much fluid you drink (fluid restriction). General instructions Raise (elevate) the injured area above the level of your heart while you are sitting or lying down. Do not sit still or stand for long periods of time. Do not wear tight clothing. Do not wear garters on your upper legs. Exercise your legs to get your circulation going. This helps to move the fluid back into your blood vessels, and it may help the swelling go down. Wear compression stockings as told by your health care provider. These stockings help to prevent  blood clots and reduce swelling in your legs. It is important that these are the correct size. These stockings should be prescribed by your health care provider to prevent possible injuries. If elastic bandages or wraps are recommended, use them as told by your health care provider. Contact a health care provider if: Your edema does not get better with treatment. You have heart, liver, or kidney disease and have symptoms of edema. You have sudden and unexplained weight gain. Get help right away if: You develop shortness of breath or chest pain. You cannot breathe when you lie down. You develop pain, redness, or warmth in the swollen areas. You have heart, liver, or kidney disease and suddenly get edema. You have a fever and your symptoms suddenly get worse. These symptoms may be an emergency. Get help right away. Call 911. Do not wait to see if the symptoms will go away. Do not drive yourself to the hospital. Summary Edema is an abnormal buildup of fluids in the body tissues and under the skin. Eating too much salt (sodium)and being on your feet or sitting for a long time can cause edema in your legs, feet, and ankles. Raise (elevate) the injured area above the level of your heart while you are sitting or lying down. Follow your health care provider's instructions about diet and how much fluid you can drink. This information is not intended to replace advice given to you by your health care provider. Make sure you discuss any questions you have with your health care provider. Document Revised: 07/30/2021 Document  Reviewed: 07/30/2021 Elsevier Patient Education  2024 [employer].

## 2024-10-26 NOTE — Progress Notes (Signed)
 Subjective:    Patient ID: Patricia Erickson, female    DOB: 10/01/1971, 53 y.o.   MRN: [id number]  Chief Complaint  Patient presents with   swelling in both ankles   right knee has swelling in a while    HPI Patient is in today for swelling .  Discussed the use of AI scribe software for clinical note transcription with the patient, who gave verbal consent to proceed.  History of Present Illness Patricia Erickson is a 53 year old female who presents with chronic swelling and pain in her legs.  She experiences sporadic swelling in her ankles, described as 'puffy' and sometimes painful, which impairs her ability to function. The swelling is inconsistent, with a particularly severe episode occurring this past Saturday, necessitating leg elevation for relief.  Approximately a year and a half ago, she noticed significant swelling in her right leg, which has remained larger than the left leg. Recently, she discovered a hard, painful knot on the side of her right knee, which she describes as feeling like a vein or valve. This knot was noticed on the day of the visit, although it may have been present earlier.  She experiences nausea when the swelling is severe, which she manages with leftover nausea medication from previous hospital stays for kidney stones or diverticulitis. She has a history of high pain tolerance, having dealt with kidney stones and diverticulitis in the past.  She mentions a benign lipoma on the back of her neck, which occasionally swells and causes nausea, similar to her ankle swelling.  She urinates frequently, which she attributes to her high water intake, noting that she drinks water almost exclusively. She reports urinating more frequently than her elderly parents.  She works for herself and does not have a guaranteed income.    Past Medical History:  Diagnosis Date   COVID    History of kidney stones    Hx of sepsis    Migraines    hz of migraines    Polyp of  colon 03/11/2007   BENIGN    Past Surgical History:  Procedure Laterality Date   CESAREAN SECTION  12/09/1998   CHOLECYSTECTOMY N/A 10/18/2015   Procedure: LAPAROSCOPIC CHOLECYSTECTOMY;  Surgeon: Herlene Beverley Bureau, MD;  Location: [facility];  Service: General;  Laterality: N/A;   CYSTOSCOPY WITH STENT PLACEMENT Left 10/23/2023   Procedure: CYSTOSCOPY WITH RETROGRADE PYELOGRAM STENT PLACEMENT;  Surgeon: Shane Steffan BROCKS, MD;  Location: [facility];  Service: Urology;  Laterality: Left;   CYSTOSCOPY/URETEROSCOPY/HOLMIUM LASER/STENT PLACEMENT Left 07/31/2020   Procedure: CYSTOSCOPY/URETEROSCOPY/HOLMIUM LASER/STENT PLACEMENT;  Surgeon: Elisabeth Valli BIRCH, MD;  Location: [facility];  Service: Urology;  Laterality: Left;  90 MINS   CYSTOSCOPY/URETEROSCOPY/HOLMIUM LASER/STENT PLACEMENT Left 11/13/2023   Procedure: CYSTOSCOPY, LEFT URETEROSCOPY, RETROGRADE PYELOGRAM; HOLMIUM LASER LITHOTRIPSY, AND LEFT URETERAL STENT EXCHANGE;  Surgeon: Shane Steffan BROCKS, MD;  Location: [facility];  Service: Urology;  Laterality: Left;  45 MINUTES   LAPAROSCOPIC CHOLECYSTECTOMY  10/18/2015    Family History  Problem Relation Age of Onset   Osteoporosis Mother    Breast cancer Mother 69   Colon cancer Mother 16       removed large intestine   Lymphoma Mother 13   Hypertension Father    Osteoporosis Maternal Grandmother    Lymphoma Maternal Grandmother 27   Colon polyps Sister        several dx. 71 or younger   Other Sister        hysterectomy  at 35; suspicious breast finding removed at 50   Colon cancer Maternal Aunt 43   Lymphoma Maternal Uncle 63   Lung cancer Maternal Grandfather 66       metastatic to bone; former smoker   Throat cancer Paternal Grandmother 35       not a smoker   Heart Problems Paternal Grandfather    Other Other        suspicious breast finding at 59 - recent follow-up normal   Heart Problems Maternal Uncle    Bladder Cancer Paternal Uncle 46   Throat cancer Paternal  Uncle        dx. before age 65    Social History   Socioeconomic History   Marital status: Divorced    Spouse name: Not on file   Number of children: Not on file   Years of education: 14   Highest education level: Associate degree: occupational, scientist, product/process development, or vocational program  Occupational History   Not on file  Tobacco Use   Smoking status: Former    Current packs/day: 0.00    Average packs/day: 0.5 packs/day for 7.0 years (3.5 ttl pk-yrs)    Types: Cigarettes    Start date: 10/12/1985    Quit date: 10/12/1992    Years since quitting: 32.0   Smokeless tobacco: Never  Vaping Use   Vaping status: Never Used  Substance and Sexual Activity   Alcohol use: Not Currently   Drug use: No   Sexual activity: Not Currently    Birth control/protection: I.U.D.    Comment: Mirena  10/29/2012  Other Topics Concern   Not on file  Social History Narrative   Not on file   Social Drivers of Health   Financial Resource Strain: Patient Declined (10/24/2024)   Overall Financial Resource Strain (CARDIA)    Difficulty of Paying Living Expenses: Patient declined  Food Insecurity: Patient Declined (10/24/2024)   Hunger Vital Sign    Worried About Running Out of Food in the Last Year: Patient declined    Ran Out of Food in the Last Year: Patient declined  Transportation Needs: No Transportation Needs (10/24/2024)   PRAPARE - Administrator, Civil Service (Medical): No    Lack of Transportation (Non-Medical): No  Physical Activity: Inactive (10/24/2024)   Exercise Vital Sign    Days of Exercise per Week: 0 days    Minutes of Exercise per Session: Not on file  Stress: No Stress Concern Present (10/24/2024)   [employer] of Occupational Health - Occupational Stress Questionnaire    Feeling of Stress: Not at all  Social Connections: Moderately Isolated (10/24/2024)   Social Connection and Isolation Panel    Frequency of Communication with Friends and Family: More than three  times a week    Frequency of Social Gatherings with Friends and Family: Once a week    Attends Religious Services: More than 4 times per year    Active Member of [employer] or Organizations: No    Attends Engineer, Structural: Not on file    Marital Status: Divorced  Intimate Partner Violence: Not At Risk (02/04/2024)   Humiliation, Afraid, Rape, and Kick questionnaire    Fear of Current or Ex-Partner: No    Emotionally Abused: No    Physically Abused: No    Sexually Abused: No    Outpatient Medications Prior to Visit  Medication Sig Dispense Refill   azelastine  (ASTELIN ) 0.1 % nasal spray Place 1 spray into both nostrils 2 (two) times daily.  Use in each nostril as directed 30 mL 0   Bacillus Coagulans-Inulin (PROBIOTIC-PREBIOTIC PO) Take 3.6 g by mouth daily.     Collagen-Vitamin C-Biotin (COLLAGEN PO) Take 1 capsule by mouth daily.     FIBER PO Take 1 Scoop by mouth See admin instructions. Mix 1 scoopful into 4-6 ounces of a desired beverage and drink by mouth once a day     gabapentin  (NEURONTIN ) 300 MG capsule Take 300 mg by mouth 2 (two) times daily.     levonorgestrel  (MIRENA , 52 MG,) 20 MCG/DAY IUD 1 each by Intrauterine route See admin instructions. Place 1 new device in the uterus every 8 years     Meth-Hyo-M Bl-Na Phos-Ph Sal (URO-MP) 118 MG CAPS Take 1 capsule by mouth 3 (three) times daily as needed (for bladder irritation).     prochlorperazine  (COMPAZINE ) 10 MG tablet Take 1 tablet (10 mg total) by mouth 2 (two) times daily as needed for nausea or vomiting. 10 tablet 0   tirzepatide (ZEPBOUND) 2.5 MG/0.5ML Pen Inject 2.5 mg into the skin once a week. 2 mL 0   No facility-administered medications prior to visit.    Allergies  Allergen Reactions   Other Other (See Comments)    Darvocet.   Propoxyphene N-Acetaminophen  Nausea And Vomiting    Review of Systems  Constitutional:  Negative for fever.  HENT:  Negative for congestion.   Eyes:  Negative for blurred  vision.  Respiratory:  Negative for cough.   Cardiovascular:  Positive for leg swelling. Negative for chest pain and palpitations.  Gastrointestinal:  Positive for nausea. Negative for abdominal pain, constipation, diarrhea and vomiting.  Musculoskeletal:  Negative for back pain.  Skin:  Negative for rash.  Neurological:  Negative for loss of consciousness and headaches.       Objective:    Physical Exam Vitals and nursing note reviewed.  Constitutional:      General: She is not in acute distress.    Appearance: Normal appearance. She is well-developed.  HENT:     Head: Normocephalic and atraumatic.  Eyes:     General: No scleral icterus.       Right eye: No discharge.        Left eye: No discharge.  Cardiovascular:     Rate and Rhythm: Normal rate and regular rhythm.     Heart sounds: No murmur heard. Pulmonary:     Effort: Pulmonary effort is normal. No respiratory distress.     Breath sounds: Normal breath sounds.  Musculoskeletal:        General: Swelling and tenderness present. Normal range of motion.     Cervical back: Normal range of motion and neck supple.     Right knee: Swelling present. Normal range of motion. Tenderness present.     Right lower leg: No edema.     Left lower leg: No edema.       Legs:     Comments: Knot medial R knee , painful to touch Swelling both ankles trace today-- nonpitting   Skin:    General: Skin is warm and dry.  Neurological:     Mental Status: She is alert and oriented to person, place, and time.  Psychiatric:        Mood and Affect: Mood normal.        Behavior: Behavior normal.        Thought Content: Thought content normal.        Judgment: Judgment normal.     BP  118/76 (BP Location: Right Arm, Patient Position: Sitting, Cuff Size: Large)   Pulse 74   Temp 99 F (37.2 C) (Oral)   Resp 12   Ht 5' 6 (1.676 m)   Wt 206 lb 6.4 oz (93.6 kg)   SpO2 98%   BMI 33.31 kg/m  Wt Readings from Last 3 Encounters:  10/26/24  206 lb 6.4 oz (93.6 kg)  09/09/24 199 lb (90.3 kg)  07/27/24 203 lb 6.4 oz (92.3 kg)    Diabetic Foot Exam - Simple   No data filed    Lab Results  Component Value Date   WBC 9.0 09/09/2024   HGB 13.5 09/09/2024   HCT 40.8 09/09/2024   PLT 325.0 09/09/2024   GLUCOSE 80 09/09/2024   CHOL 178 09/09/2024   TRIG 93.0 09/09/2024   HDL 47.00 09/09/2024   LDLCALC 112 (H) 09/09/2024   ALT 24 09/09/2024   AST 16 09/09/2024   NA 144 09/09/2024   K 3.9 09/09/2024   CL 105 09/09/2024   CREATININE 0.72 09/09/2024   BUN 21 09/09/2024   CO2 30 09/09/2024   TSH 0.72 09/09/2024   HGBA1C 5.9 (H) 06/25/2024    Lab Results  Component Value Date   TSH 0.72 09/09/2024   Lab Results  Component Value Date   WBC 9.0 09/09/2024   HGB 13.5 09/09/2024   HCT 40.8 09/09/2024   MCV 87.4 09/09/2024   PLT 325.0 09/09/2024   Lab Results  Component Value Date   NA 144 09/09/2024   K 3.9 09/09/2024   CO2 30 09/09/2024   GLUCOSE 80 09/09/2024   BUN 21 09/09/2024   CREATININE 0.72 09/09/2024   BILITOT 0.4 09/09/2024   ALKPHOS 78 09/09/2024   AST 16 09/09/2024   ALT 24 09/09/2024   PROT 6.8 09/09/2024   ALBUMIN 4.4 09/09/2024   CALCIUM 9.6 09/09/2024   ANIONGAP 10 06/18/2024   EGFR 99 06/25/2024   GFR 93.99 09/09/2024   Lab Results  Component Value Date   CHOL 178 09/09/2024   Lab Results  Component Value Date   HDL 47.00 09/09/2024   Lab Results  Component Value Date   LDLCALC 112 (H) 09/09/2024   Lab Results  Component Value Date   TRIG 93.0 09/09/2024   Lab Results  Component Value Date   CHOLHDL 4 09/09/2024   Lab Results  Component Value Date   HGBA1C 5.9 (H) 06/25/2024       Assessment & Plan:  Right calf pain -     US  Venous Img Lower Unilateral Right (DVT); Future -     Basic metabolic panel with GFR -     Magnesium   Edema, unspecified type -     hydroCHLOROthiazide; Take 1 tablet (25 mg total) by mouth daily.  Dispense: 90 tablet; Refill:  3  Assessment and Plan Assessment & Plan Chronic right lower leg swelling and pain   Chronic swelling and pain in the right lower leg have persisted for a year and a half, recently worsening. The leg is larger than the left, with a hard knot on the knee, possibly linked to a venous valve. Differential diagnosis includes venous insufficiency, Baker's cyst, or other vascular issues. There are no signs of acute heart failure or systemic causes. Nausea occurs with severe swelling episodes, possibly due to pain. An ultrasound of the right leg is ordered to evaluate for clots, Baker's cysts, or other vascular issues. A diuretic is prescribed to manage fluid retention, with instructions to  take it in the morning. She is advised on potassium intake to prevent depletion from diuretic use and recommended to wear compression socks in the morning to manage swelling. A follow-up is scheduled in 2-3 weeks to assess response to treatment.  Lipoma of posterior neck   A lipoma on the posterior neck occasionally swells and causes nausea. It was previously evaluated with ultrasound and determined to be benign. Surgical removal is an option if it becomes painful or problematic, though the location may complicate the procedure. A referral to a surgeon will be considered for evaluation and potential removal if the lipoma becomes painful or problematic.    Christain Niznik R Lowne Chase, DO

## 2024-10-27 ENCOUNTER — Ambulatory Visit (HOSPITAL_BASED_OUTPATIENT_CLINIC_OR_DEPARTMENT_OTHER)
Admission: RE | Admit: 2024-10-27 | Discharge: 2024-10-27 | Disposition: A | Discharge status: Home / Self Care | Source: Ambulatory Visit | Attending: Family Medicine | Admitting: Family Medicine

## 2024-10-27 DIAGNOSIS — M79661 Pain in right lower leg: Secondary | ICD-10-CM | POA: Diagnosis present

## 2024-10-27 LAB — BASIC METABOLIC PANEL WITH GFR
BUN: 25 mg/dL — ABNORMAL HIGH (ref 6–23)
CO2: 27 meq/L (ref 19–32)
Calcium: 9.5 mg/dL (ref 8.4–10.5)
Chloride: 105 meq/L (ref 96–112)
Creatinine, Ser: 0.8 mg/dL (ref 0.40–1.20)
GFR: 82.75 mL/min (ref >60.00)
Glucose, Bld: 102 mg/dL — ABNORMAL HIGH (ref 70–99)
Potassium: 4.1 meq/L (ref 3.5–5.1)
Sodium: 142 meq/L (ref 135–145)

## 2024-10-27 LAB — MAGNESIUM: Magnesium: 2.2 mg/dL (ref 1.5–2.5)

## 2024-10-28 ENCOUNTER — Ambulatory Visit: Payer: Self-pay | Admitting: Family Medicine

## 2024-11-09 ENCOUNTER — Ambulatory Visit: Admitting: Family Medicine

## 2024-12-01 ENCOUNTER — Other Ambulatory Visit (HOSPITAL_COMMUNITY): Payer: Self-pay

## 2024-12-08 ENCOUNTER — Other Ambulatory Visit (HOSPITAL_COMMUNITY): Payer: Self-pay

## 2025-02-04 ENCOUNTER — Encounter: Payer: Medicaid Other | Admitting: Physician Assistant
